# Patient Record
Sex: Female | Born: 1940
Health system: Southern US, Community
[De-identification: ages and names within clinical notes are randomized; demographics above are authoritative.]

## PROBLEM LIST (undated history)

## (undated) DIAGNOSIS — M1990 Unspecified osteoarthritis, unspecified site: Secondary | ICD-10-CM

## (undated) DIAGNOSIS — Z923 Personal history of irradiation: Secondary | ICD-10-CM

## (undated) DIAGNOSIS — M8580 Other specified disorders of bone density and structure, unspecified site: Secondary | ICD-10-CM

## (undated) DIAGNOSIS — D649 Anemia, unspecified: Secondary | ICD-10-CM

## (undated) DIAGNOSIS — T7840XA Allergy, unspecified, initial encounter: Secondary | ICD-10-CM

## (undated) DIAGNOSIS — I8390 Asymptomatic varicose veins of unspecified lower extremity: Secondary | ICD-10-CM

## (undated) DIAGNOSIS — C50919 Malignant neoplasm of unspecified site of unspecified female breast: Secondary | ICD-10-CM

## (undated) DIAGNOSIS — I10 Essential (primary) hypertension: Secondary | ICD-10-CM

## (undated) DIAGNOSIS — D126 Benign neoplasm of colon, unspecified: Secondary | ICD-10-CM

## (undated) HISTORY — PX: BICEPT TENODESIS: SHX5116

## (undated) HISTORY — DX: Asymptomatic varicose veins of unspecified lower extremity: I83.90

## (undated) HISTORY — DX: Other specified disorders of bone density and structure, unspecified site: M85.80

## (undated) HISTORY — DX: Allergy, unspecified, initial encounter: T78.40XA

## (undated) HISTORY — PX: COLONOSCOPY: SHX174

## (undated) HISTORY — DX: Benign neoplasm of colon, unspecified: D12.6

## (undated) HISTORY — DX: Malignant neoplasm of unspecified site of unspecified female breast: C50.919

## (undated) HISTORY — DX: Anemia, unspecified: D64.9

## (undated) HISTORY — PX: POLYPECTOMY: SHX149

## (undated) HISTORY — DX: Unspecified osteoarthritis, unspecified site: M19.90

## (undated) HISTORY — DX: Essential (primary) hypertension: I10

## (undated) HISTORY — PX: CATARACT EXTRACTION, BILATERAL: SHX1313

---
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
## 1983-04-01 HISTORY — PX: ABDOMINAL HYSTERECTOMY: SHX81

## 1998-01-18 ENCOUNTER — Ambulatory Visit (HOSPITAL_COMMUNITY): Admission: RE | Admit: 1998-01-18 | Discharge: 1998-01-18 | Payer: Self-pay | Admitting: *Deleted

## 1999-01-31 ENCOUNTER — Ambulatory Visit (HOSPITAL_COMMUNITY): Admission: RE | Admit: 1999-01-31 | Discharge: 1999-01-31 | Payer: Self-pay | Admitting: Internal Medicine

## 1999-12-17 ENCOUNTER — Other Ambulatory Visit: Admission: RE | Admit: 1999-12-17 | Discharge: 1999-12-17 | Payer: Self-pay | Admitting: Obstetrics and Gynecology

## 2000-02-13 ENCOUNTER — Encounter: Payer: Self-pay | Admitting: Obstetrics and Gynecology

## 2000-02-13 ENCOUNTER — Ambulatory Visit (HOSPITAL_COMMUNITY): Admission: RE | Admit: 2000-02-13 | Discharge: 2000-02-13 | Payer: Self-pay | Admitting: Obstetrics and Gynecology

## 2000-03-11 ENCOUNTER — Encounter: Payer: Self-pay | Admitting: Obstetrics and Gynecology

## 2000-03-11 ENCOUNTER — Encounter (INDEPENDENT_AMBULATORY_CARE_PROVIDER_SITE_OTHER): Payer: Self-pay | Admitting: Specialist

## 2000-03-11 ENCOUNTER — Encounter: Admission: RE | Admit: 2000-03-11 | Discharge: 2000-03-11 | Payer: Self-pay | Admitting: Obstetrics and Gynecology

## 2000-03-12 ENCOUNTER — Other Ambulatory Visit: Admission: RE | Admit: 2000-03-12 | Discharge: 2000-03-12 | Payer: Self-pay | Admitting: Obstetrics and Gynecology

## 2000-09-15 ENCOUNTER — Encounter: Admission: RE | Admit: 2000-09-15 | Discharge: 2000-09-15 | Payer: Self-pay | Admitting: Obstetrics and Gynecology

## 2000-09-15 ENCOUNTER — Encounter: Payer: Self-pay | Admitting: Obstetrics and Gynecology

## 2001-01-18 ENCOUNTER — Ambulatory Visit (HOSPITAL_COMMUNITY): Admission: RE | Admit: 2001-01-18 | Discharge: 2001-01-18 | Payer: Self-pay | Admitting: Obstetrics and Gynecology

## 2001-03-31 HISTORY — PX: SHOULDER SURGERY: SHX246

## 2001-06-14 ENCOUNTER — Ambulatory Visit (HOSPITAL_BASED_OUTPATIENT_CLINIC_OR_DEPARTMENT_OTHER): Admission: RE | Admit: 2001-06-14 | Discharge: 2001-06-14 | Payer: Self-pay | Admitting: Orthopedic Surgery

## 2001-12-29 ENCOUNTER — Other Ambulatory Visit: Admission: RE | Admit: 2001-12-29 | Discharge: 2001-12-29 | Payer: Self-pay | Admitting: Obstetrics and Gynecology

## 2002-01-19 ENCOUNTER — Ambulatory Visit (HOSPITAL_COMMUNITY): Admission: RE | Admit: 2002-01-19 | Discharge: 2002-01-19 | Payer: Self-pay | Admitting: Obstetrics and Gynecology

## 2003-01-25 ENCOUNTER — Ambulatory Visit (HOSPITAL_COMMUNITY): Admission: RE | Admit: 2003-01-25 | Discharge: 2003-01-25 | Payer: Self-pay | Admitting: *Deleted

## 2004-01-26 ENCOUNTER — Ambulatory Visit (HOSPITAL_COMMUNITY): Admission: RE | Admit: 2004-01-26 | Discharge: 2004-01-26 | Payer: Self-pay | Admitting: Obstetrics and Gynecology

## 2004-02-06 ENCOUNTER — Ambulatory Visit: Payer: Self-pay | Admitting: Internal Medicine

## 2004-02-13 ENCOUNTER — Ambulatory Visit: Payer: Self-pay | Admitting: Internal Medicine

## 2004-02-17 ENCOUNTER — Ambulatory Visit: Payer: Self-pay | Admitting: Internal Medicine

## 2004-02-26 ENCOUNTER — Encounter: Admission: RE | Admit: 2004-02-26 | Discharge: 2004-02-26 | Payer: Self-pay | Admitting: Internal Medicine

## 2005-01-27 ENCOUNTER — Ambulatory Visit (HOSPITAL_COMMUNITY): Admission: RE | Admit: 2005-01-27 | Discharge: 2005-01-27 | Payer: Self-pay | Admitting: Internal Medicine

## 2005-03-17 ENCOUNTER — Encounter: Admission: RE | Admit: 2005-03-17 | Discharge: 2005-03-17 | Payer: Self-pay | Admitting: Obstetrics and Gynecology

## 2005-03-20 ENCOUNTER — Ambulatory Visit: Payer: Self-pay | Admitting: Pulmonary Disease

## 2005-06-13 ENCOUNTER — Ambulatory Visit: Payer: Self-pay | Admitting: Internal Medicine

## 2005-06-16 ENCOUNTER — Ambulatory Visit: Payer: Self-pay | Admitting: Internal Medicine

## 2005-07-24 ENCOUNTER — Ambulatory Visit: Payer: Self-pay | Admitting: Internal Medicine

## 2005-07-30 ENCOUNTER — Ambulatory Visit (HOSPITAL_COMMUNITY): Admission: RE | Admit: 2005-07-30 | Discharge: 2005-07-30 | Payer: Self-pay | Admitting: Internal Medicine

## 2005-08-21 ENCOUNTER — Ambulatory Visit: Payer: Self-pay | Admitting: Endocrinology

## 2005-09-02 ENCOUNTER — Encounter (INDEPENDENT_AMBULATORY_CARE_PROVIDER_SITE_OTHER): Payer: Self-pay | Admitting: Specialist

## 2005-09-02 ENCOUNTER — Encounter: Admission: RE | Admit: 2005-09-02 | Discharge: 2005-09-02 | Payer: Self-pay | Admitting: Endocrinology

## 2005-09-02 ENCOUNTER — Other Ambulatory Visit: Admission: RE | Admit: 2005-09-02 | Discharge: 2005-09-02 | Payer: Self-pay | Admitting: Interventional Radiology

## 2005-09-15 ENCOUNTER — Encounter: Admission: RE | Admit: 2005-09-15 | Discharge: 2005-09-15 | Payer: Self-pay | Admitting: Obstetrics & Gynecology

## 2005-09-15 ENCOUNTER — Ambulatory Visit: Payer: Self-pay | Admitting: Endocrinology

## 2005-09-24 ENCOUNTER — Encounter (INDEPENDENT_AMBULATORY_CARE_PROVIDER_SITE_OTHER): Payer: Self-pay | Admitting: Specialist

## 2005-09-24 ENCOUNTER — Encounter: Admission: RE | Admit: 2005-09-24 | Discharge: 2005-09-24 | Payer: Self-pay | Admitting: Endocrinology

## 2006-01-28 ENCOUNTER — Ambulatory Visit (HOSPITAL_COMMUNITY): Admission: RE | Admit: 2006-01-28 | Discharge: 2006-01-28 | Payer: Self-pay | Admitting: Obstetrics & Gynecology

## 2006-02-10 ENCOUNTER — Encounter: Admission: RE | Admit: 2006-02-10 | Discharge: 2006-02-10 | Payer: Self-pay | Admitting: Obstetrics & Gynecology

## 2006-09-17 ENCOUNTER — Ambulatory Visit: Payer: Self-pay | Admitting: Internal Medicine

## 2006-09-17 LAB — CONVERTED CEMR LAB
AST: 21 units/L (ref 0–37)
Alkaline Phosphatase: 54 units/L (ref 39–117)
Basophils Absolute: 0 10*3/uL (ref 0.0–0.1)
Bilirubin Urine: NEGATIVE
Bilirubin, Direct: 0.1 mg/dL (ref 0.0–0.3)
Calcium: 9.4 mg/dL (ref 8.4–10.5)
Chloride: 107 meq/L (ref 96–112)
Cholesterol: 209 mg/dL (ref 0–200)
Creatinine, Ser: 0.6 mg/dL (ref 0.4–1.2)
Eosinophils Absolute: 0.1 10*3/uL (ref 0.0–0.6)
GFR calc non Af Amer: 106 mL/min
Glucose, Bld: 106 mg/dL — ABNORMAL HIGH (ref 70–99)
Ketones, ur: NEGATIVE mg/dL
Lymphocytes Relative: 25.7 % (ref 12.0–46.0)
MCHC: 35.3 g/dL (ref 30.0–36.0)
Monocytes Absolute: 0.4 10*3/uL (ref 0.2–0.7)
Nitrite: NEGATIVE
Potassium: 4.7 meq/L (ref 3.5–5.1)
Sodium: 142 meq/L (ref 135–145)
Specific Gravity, Urine: 1.015 (ref 1.000–1.03)
TSH: 0.5 microintl units/mL (ref 0.35–5.50)
Total Bilirubin: 0.7 mg/dL (ref 0.3–1.2)
Total CHOL/HDL Ratio: 2.9
VLDL: 12 mg/dL (ref 0–40)
pH: 7.5 (ref 5.0–8.0)

## 2006-09-23 ENCOUNTER — Ambulatory Visit: Payer: Self-pay | Admitting: Internal Medicine

## 2006-12-02 ENCOUNTER — Encounter: Payer: Self-pay | Admitting: Endocrinology

## 2006-12-24 ENCOUNTER — Other Ambulatory Visit: Admission: RE | Admit: 2006-12-24 | Discharge: 2006-12-24 | Payer: Self-pay | Admitting: Obstetrics & Gynecology

## 2007-01-20 ENCOUNTER — Encounter: Payer: Self-pay | Admitting: Endocrinology

## 2007-01-20 ENCOUNTER — Ambulatory Visit: Payer: Self-pay | Admitting: Endocrinology

## 2007-01-20 DIAGNOSIS — E042 Nontoxic multinodular goiter: Secondary | ICD-10-CM | POA: Insufficient documentation

## 2007-01-26 ENCOUNTER — Encounter: Admission: RE | Admit: 2007-01-26 | Discharge: 2007-01-26 | Payer: Self-pay | Admitting: Endocrinology

## 2007-02-03 ENCOUNTER — Telehealth (INDEPENDENT_AMBULATORY_CARE_PROVIDER_SITE_OTHER): Payer: Self-pay | Admitting: *Deleted

## 2007-02-15 ENCOUNTER — Ambulatory Visit (HOSPITAL_COMMUNITY): Admission: RE | Admit: 2007-02-15 | Discharge: 2007-02-15 | Payer: Self-pay | Admitting: Internal Medicine

## 2007-04-01 HISTORY — PX: TRICEPS TENDON REPAIR: SHX2577

## 2007-04-01 HISTORY — PX: ROTATOR CUFF REPAIR: SHX139

## 2007-04-09 ENCOUNTER — Encounter: Payer: Self-pay | Admitting: Internal Medicine

## 2007-11-03 ENCOUNTER — Ambulatory Visit (HOSPITAL_BASED_OUTPATIENT_CLINIC_OR_DEPARTMENT_OTHER): Admission: RE | Admit: 2007-11-03 | Discharge: 2007-11-03 | Payer: Self-pay | Admitting: Orthopedic Surgery

## 2008-02-18 ENCOUNTER — Ambulatory Visit (HOSPITAL_COMMUNITY): Admission: RE | Admit: 2008-02-18 | Discharge: 2008-02-18 | Payer: Self-pay | Admitting: Obstetrics & Gynecology

## 2008-04-17 ENCOUNTER — Encounter: Admission: RE | Admit: 2008-04-17 | Discharge: 2008-04-17 | Payer: Self-pay | Admitting: Obstetrics & Gynecology

## 2008-06-07 ENCOUNTER — Ambulatory Visit: Payer: Self-pay | Admitting: Internal Medicine

## 2008-06-07 DIAGNOSIS — M1990 Unspecified osteoarthritis, unspecified site: Secondary | ICD-10-CM

## 2008-06-08 ENCOUNTER — Telehealth: Payer: Self-pay | Admitting: Internal Medicine

## 2008-06-08 LAB — CONVERTED CEMR LAB
ALT: 55 units/L — ABNORMAL HIGH (ref 0–35)
AST: 36 units/L (ref 0–37)
Alkaline Phosphatase: 110 units/L (ref 39–117)
Cholesterol: 211 mg/dL (ref 0–200)
Creatinine, Ser: 0.5 mg/dL (ref 0.4–1.2)
Direct LDL: 104.3 mg/dL
Eosinophils Relative: 1.4 % (ref 0.0–5.0)
GFR calc Af Amer: 157 mL/min
GFR calc non Af Amer: 130 mL/min
Glucose, Bld: 106 mg/dL — ABNORMAL HIGH (ref 70–99)
HDL: 77 mg/dL (ref >39.0)
Hemoglobin, Urine: NEGATIVE
Ketones, ur: NEGATIVE mg/dL
Leukocytes, UA: NEGATIVE
MCV: 89.2 fL (ref 78.0–100.0)
Monocytes Relative: 6.6 % (ref 3.0–12.0)
Nitrite: NEGATIVE
Platelets: 267 10*3/uL (ref 150–400)
RBC: 4.02 M/uL (ref 3.87–5.11)
RDW: 12.5 % (ref 11.5–14.6)
Sodium: 140 meq/L (ref 135–145)
Total Protein, Urine: NEGATIVE mg/dL
Total Protein: 7.1 g/dL (ref 6.0–8.3)
Triglycerides: 58 mg/dL (ref 0–149)
Urine Glucose: NEGATIVE mg/dL
VLDL: 12 mg/dL (ref 0–40)
WBC: 5.5 10*3/uL (ref 4.5–10.5)
pH: 7.5 (ref 5.0–8.0)

## 2008-12-13 ENCOUNTER — Telehealth: Payer: Self-pay | Admitting: Internal Medicine

## 2009-02-19 ENCOUNTER — Ambulatory Visit (HOSPITAL_COMMUNITY): Admission: RE | Admit: 2009-02-19 | Discharge: 2009-02-19 | Payer: Self-pay | Admitting: Obstetrics & Gynecology

## 2009-03-31 DIAGNOSIS — C50919 Malignant neoplasm of unspecified site of unspecified female breast: Secondary | ICD-10-CM

## 2009-03-31 HISTORY — DX: Malignant neoplasm of unspecified site of unspecified female breast: C50.919

## 2009-03-31 HISTORY — PX: BREAST LUMPECTOMY: SHX2

## 2009-05-08 ENCOUNTER — Ambulatory Visit: Payer: Self-pay | Admitting: Internal Medicine

## 2009-05-08 DIAGNOSIS — R209 Unspecified disturbances of skin sensation: Secondary | ICD-10-CM | POA: Insufficient documentation

## 2009-05-10 LAB — CONVERTED CEMR LAB
ALT: 16 units/L (ref 0–35)
AST: 19 units/L (ref 0–37)
CO2: 33 meq/L — ABNORMAL HIGH (ref 19–32)
Creatinine, Ser: 0.6 mg/dL (ref 0.4–1.2)
GFR calc non Af Amer: 104.92 mL/min (ref >60)
Glucose, Bld: 99 mg/dL (ref 70–99)
Sed Rate: 33 mm/hr — ABNORMAL HIGH (ref 0–22)
Sodium: 141 meq/L (ref 135–145)
Vitamin B-12: 473 pg/mL (ref 211–911)

## 2009-05-28 ENCOUNTER — Telehealth: Payer: Self-pay | Admitting: Internal Medicine

## 2009-08-13 ENCOUNTER — Encounter: Payer: Self-pay | Admitting: Internal Medicine

## 2009-08-22 ENCOUNTER — Encounter: Payer: Self-pay | Admitting: Internal Medicine

## 2009-10-04 ENCOUNTER — Encounter: Admission: RE | Admit: 2009-10-04 | Discharge: 2009-10-04 | Payer: Self-pay | Admitting: Obstetrics & Gynecology

## 2010-02-25 ENCOUNTER — Encounter: Admission: RE | Admit: 2010-02-25 | Discharge: 2010-02-25 | Payer: Self-pay | Admitting: Obstetrics & Gynecology

## 2010-03-12 ENCOUNTER — Ambulatory Visit (HOSPITAL_COMMUNITY)
Admission: RE | Admit: 2010-03-12 | Discharge: 2010-03-12 | Payer: Self-pay | Source: Home / Self Care | Attending: Surgery | Admitting: Surgery

## 2010-03-19 ENCOUNTER — Encounter: Payer: Self-pay | Admitting: Internal Medicine

## 2010-04-03 LAB — BASIC METABOLIC PANEL
BUN: 12 mg/dL (ref 6–23)
CO2: 28 mEq/L (ref 19–32)
Calcium: 9.9 mg/dL (ref 8.4–10.5)
Chloride: 102 mEq/L (ref 96–112)
Creatinine, Ser: 0.69 mg/dL (ref 0.4–1.2)
GFR calc Af Amer: >60 mL/min (ref >60)
GFR calc non Af Amer: >60 mL/min (ref >60)
Glucose, Bld: 113 mg/dL — ABNORMAL HIGH (ref 70–99)
Potassium: 4.3 mEq/L (ref 3.5–5.1)
Sodium: 138 mEq/L (ref 135–145)

## 2010-04-03 LAB — CBC
HCT: 37.1 % (ref 36.0–46.0)
Hemoglobin: 12.6 g/dL (ref 12.0–15.0)
MCH: 29.7 pg (ref 26.0–34.0)
MCHC: 34 g/dL (ref 30.0–36.0)
MCV: 87.5 fL (ref 78.0–100.0)
Platelets: 245 10*3/uL (ref 150–400)
RBC: 4.24 MIL/uL (ref 3.87–5.11)
RDW: 12.6 % (ref 11.5–15.5)
WBC: 5.3 10*3/uL (ref 4.0–10.5)

## 2010-04-04 ENCOUNTER — Ambulatory Visit (HOSPITAL_COMMUNITY)
Admission: RE | Admit: 2010-04-04 | Discharge: 2010-04-04 | Payer: Self-pay | Source: Home / Self Care | Attending: Surgery | Admitting: Surgery

## 2010-04-04 LAB — DIFFERENTIAL
Basophils Absolute: 0 10*3/uL (ref 0.0–0.1)
Basophils Relative: 0 % (ref 0–1)
Eosinophils Absolute: 0.1 10*3/uL (ref 0.0–0.7)
Eosinophils Relative: 2 % (ref 0–5)
Lymphocytes Relative: 24 % (ref 12–46)
Lymphs Abs: 1.3 10*3/uL (ref 0.7–4.0)
Monocytes Absolute: 0.4 10*3/uL (ref 0.1–1.0)
Monocytes Relative: 7 % (ref 3–12)
Neutro Abs: 3.6 10*3/uL (ref 1.7–7.7)
Neutrophils Relative %: 67 % (ref 43–77)

## 2010-04-09 ENCOUNTER — Encounter: Payer: Self-pay | Admitting: Internal Medicine

## 2010-04-09 ENCOUNTER — Ambulatory Visit: Payer: Self-pay | Admitting: Oncology

## 2010-04-22 ENCOUNTER — Encounter: Payer: Self-pay | Admitting: Internal Medicine

## 2010-04-22 LAB — COMPREHENSIVE METABOLIC PANEL
ALT: 65 U/L — ABNORMAL HIGH (ref 0–35)
AST: 43 U/L — ABNORMAL HIGH (ref 0–37)
Albumin: 3.7 g/dL (ref 3.5–5.2)
Alkaline Phosphatase: 111 U/L (ref 39–117)
BUN: 16 mg/dL (ref 6–23)
CO2: 29 mEq/L (ref 19–32)
Calcium: 9.5 mg/dL (ref 8.4–10.5)
Chloride: 100 mEq/L (ref 96–112)
Creatinine, Ser: 0.69 mg/dL (ref 0.40–1.20)
Glucose, Bld: 96 mg/dL (ref 70–99)
Potassium: 4.2 mEq/L (ref 3.5–5.3)
Total Bilirubin: 0.6 mg/dL (ref 0.3–1.2)
Total Protein: 7.5 g/dL (ref 6.0–8.3)

## 2010-04-23 ENCOUNTER — Ambulatory Visit
Admission: RE | Admit: 2010-04-23 | Discharge: 2010-04-30 | Payer: Self-pay | Source: Home / Self Care | Attending: Radiation Oncology | Admitting: Radiation Oncology

## 2010-04-24 ENCOUNTER — Encounter: Payer: Self-pay | Admitting: Internal Medicine

## 2010-05-02 NOTE — Progress Notes (Signed)
Summary: Not better  Phone Note Call from Patient   Summary of Call: Pt has elevated her foot and been wearing loose shoes but continues to c/o same symptoms.  Initial call taken by: Lamar Sprinkles, CMA,  May 28, 2009 10:58 AM  Follow-up for Phone Call        I would suggest a Neurol consult if OK w/pt Follow-up by: Tresa Garter MD,  May 28, 2009 5:14 PM  Additional Follow-up for Phone Call Additional follow up Details #1::        left mess to call office back. Additional Follow-up by: Lamar Sprinkles, CMA,  May 29, 2009 11:48 AM    Additional Follow-up for Phone Call Additional follow up Details #2::    Patient notified and she will observe the issue a little longer, but please go ahead and make referral, pt will go if MD feels it necessary. Patient aware to expect a call from [facility]. Follow-up by: Lucious Groves,  May 30, 2009 9:15 AM  Additional Follow-up for Phone Call Additional follow up Details #3:: Details for Additional Follow-up Action Taken: OK Additional Follow-up by: Tresa Garter MD,  May 30, 2009 1:14 PM

## 2010-05-02 NOTE — Consult Note (Signed)
Summary: [facility]  [facility]   Imported By: Sherian Rein 09/04/2009 14:52:48  _____________________________________________________________________  External Attachment:    Type:   Image     Comment:   External Document

## 2010-05-02 NOTE — Assessment & Plan Note (Signed)
Summary: numbness in left foot and leg-lb   Vital Signs:  Patient profile:   70 year old female Height:      65 inches Weight:      147 pounds BMI:     24.55 Temp:     98.5 degrees F oral Pulse rate:   66 / minute BP sitting:   152 / 66  (left arm)  Vitals Entered By: Tora Perches (May 08, 2009 4:21 PM) CC: numbness in left leg and foot  Is Patient Diabetic? No   Primary Care Provider:  Dr. Posey Rea  CC:  numbness in left leg and foot .  History of Present Illness: C/o in L foot x 1 wk, constant  L lat area and heel C/o plantar fasciitis R x 1 year  Preventive Screening-Counseling & Management  Alcohol-Tobacco     Smoking Status: current  Current Medications (verified): 1)  Calcium 600 Mg  Tabs (Calcium) .... Take 1 By Mouth Qd 2)  Multivitamins   Tabs (Multiple Vitamin) .... 2-3 X Weekly 3)  Glucosamine  2000mg  .... Take 2 By Mouth Qd 4)  Fish Oil  Oil (Fish Oil) .... Once Daily 5)  Voltaren 1 % Gel (Diclofenac Sodium) .... As Needed 6)  Vivelle-Dot 0.025 Mg/24hr Pttw (Estradiol) .... Twice Weekly 7)  Vitamin D3 1000 Unit  Tabs (Cholecalciferol) .... 2 By Mouth Daily  Allergies: 1)  ! Codeine Sulfate (Codeine Sulfate) 2)  Aspirin  Past History:  Past Medical History: Last updated: 06/07/2008 GOITER, NONTOXIC MULTINODULAR (ICD-241.1) GYN Dr Hyacinth Meeker Osteoarthritis  Social History: Last updated: 06/07/2008 Retired Married Never Smoked Regular exercise-yes  Social History: Smoking Status:  current  Physical Exam  General:  Well-developed,well-nourished,in no acute distress; alert,appropriate and cooperative throughout examination Msk:  B feet WNL, NT Tight shoes Skin:  Feet WNL Psych:  Oriented X3.     Impression & Recommendations:  Problem # 1:  PARESTHESIA (ICD-782.0) L foot pressure neuropathy (lateral) Assessment New Loosen up shoe laces Orders: TLB-B12, Serum-Total ONLY (82956-O13) TLB-BMP (Basic Metabolic Panel-BMET)  (80048-METABOL) TLB-Sedimentation Rate (ESR) (85652-ESR) TLB-TSH (Thyroid Stimulating Hormone) (84443-TSH) TLB-Hepatic/Liver Function Pnl (80076-HEPATIC)  Complete Medication List: 1)  Calcium 600 Mg Tabs (Calcium) .... Take 1 by mouth qd 2)  Multivitamins Tabs (Multiple vitamin) .... 2-3 x weekly 3)  Glucosamine 2000mg   .... Take 2 by mouth qd 4)  Fish Oil Oil (Fish oil) .... Once daily 5)  Voltaren 1 % Gel (Diclofenac sodium) .... As needed 6)  Vivelle-dot 0.025 Mg/24hr Pttw (Estradiol) .... Twice weekly 7)  Vitamin D3 1000 Unit Tabs (Cholecalciferol) .... 2 by mouth daily  Patient Instructions: 1)  Call if you are not better in a reasonable amount of time or if worse.

## 2010-05-02 NOTE — Letter (Signed)
Summary: [facility]   Imported By: Sherian Rein 04/11/2010 11:14:17  _____________________________________________________________________  External Attachment:    Type:   Image     Comment:   External Document

## 2010-05-06 ENCOUNTER — Ambulatory Visit: Payer: Medicare Other | Attending: Radiation Oncology | Admitting: Radiation Oncology

## 2010-05-06 DIAGNOSIS — C50919 Malignant neoplasm of unspecified site of unspecified female breast: Secondary | ICD-10-CM | POA: Insufficient documentation

## 2010-05-06 DIAGNOSIS — Z510 Encounter for antineoplastic radiation therapy: Secondary | ICD-10-CM | POA: Insufficient documentation

## 2010-05-06 DIAGNOSIS — R21 Rash and other nonspecific skin eruption: Secondary | ICD-10-CM | POA: Insufficient documentation

## 2010-05-06 DIAGNOSIS — Y842 Radiological procedure and radiotherapy as the cause of abnormal reaction of the patient, or of later complication, without mention of misadventure at the time of the procedure: Secondary | ICD-10-CM | POA: Insufficient documentation

## 2010-05-08 NOTE — Letter (Signed)
Summary: [facility]   Imported By: Sherian Rein 05/01/2010 09:58:34  _____________________________________________________________________  External Attachment:    Type:   Image     Comment:   External Document

## 2010-05-08 NOTE — Letter (Signed)
Summary: [facility]  [facility]   Imported By: Lennie Odor 04/30/2010 14:43:08  _____________________________________________________________________  External Attachment:    Type:   Image     Comment:   External Document

## 2010-05-22 NOTE — Letter (Signed)
Summary: [facility]  [facility]   Imported By: Lester [place] 05/14/2010 10:55:01  _____________________________________________________________________  External Attachment:    Type:   Image     Comment:   External Document

## 2010-06-03 ENCOUNTER — Encounter (HOSPITAL_BASED_OUTPATIENT_CLINIC_OR_DEPARTMENT_OTHER): Payer: Medicare Other | Admitting: Oncology

## 2010-06-03 ENCOUNTER — Other Ambulatory Visit: Payer: Self-pay | Admitting: Oncology

## 2010-06-03 DIAGNOSIS — D0590 Unspecified type of carcinoma in situ of unspecified breast: Secondary | ICD-10-CM

## 2010-06-03 LAB — CBC WITH DIFFERENTIAL/PLATELET
Basophils Absolute: 0 10*3/uL (ref 0.0–0.1)
Eosinophils Absolute: 0.1 10*3/uL (ref 0.0–0.5)
MCH: 29.8 pg (ref 25.1–34.0)
MONO#: 0.3 10*3/uL (ref 0.1–0.9)
MONO%: 6.4 % (ref 0.0–14.0)
Platelets: 260 10*3/uL (ref 145–400)

## 2010-06-03 LAB — COMPREHENSIVE METABOLIC PANEL
ALT: 19 U/L (ref 0–35)
AST: 21 U/L (ref 0–37)
Albumin: 4 g/dL (ref 3.5–5.2)
CO2: 30 mEq/L (ref 19–32)
Calcium: 9.7 mg/dL (ref 8.4–10.5)
Chloride: 101 mEq/L (ref 96–112)
Glucose, Bld: 93 mg/dL (ref 70–99)
Potassium: 3.8 mEq/L (ref 3.5–5.3)
Total Bilirubin: 0.6 mg/dL (ref 0.3–1.2)

## 2010-06-10 LAB — CBC
HCT: 35.8 % — ABNORMAL LOW (ref 36.0–46.0)
Hemoglobin: 12.1 g/dL (ref 12.0–15.0)
MCHC: 33.8 g/dL (ref 30.0–36.0)
MCV: 86.9 fL (ref 78.0–100.0)
WBC: 6.2 10*3/uL (ref 4.0–10.5)

## 2010-06-10 LAB — SURGICAL PCR SCREEN
MRSA, PCR: NEGATIVE
Staphylococcus aureus: NEGATIVE

## 2010-07-10 ENCOUNTER — Encounter (HOSPITAL_BASED_OUTPATIENT_CLINIC_OR_DEPARTMENT_OTHER): Payer: Medicare Other | Admitting: Oncology

## 2010-07-10 ENCOUNTER — Ambulatory Visit: Payer: Medicare Other | Attending: Radiation Oncology | Admitting: Radiation Oncology

## 2010-07-10 DIAGNOSIS — C50119 Malignant neoplasm of central portion of unspecified female breast: Secondary | ICD-10-CM

## 2010-07-10 DIAGNOSIS — D0590 Unspecified type of carcinoma in situ of unspecified breast: Secondary | ICD-10-CM

## 2010-08-13 NOTE — Assessment & Plan Note (Signed)
[facility]                           PRIMARY CARE OFFICE NOTE   NAME:Caroline Olson, Caroline Olson                       MRN:          [id number]  DATE:09/23/2006                            DOB:          1940/04/06    The patient is a 70 year old female who presents for a wellness  examination. Past medical history, family history and social history as  per July 24, 2005.   Note, she had several episodes of tinnitus. Her toenail fungus in the  right foot is not much better. She was diagnosed with thyroid nodule.   CURRENT MEDICATIONS:  Glucosamine, calcium with vitamin D, multivitamin,  Femring.   REVIEW OF SYSTEMS:  Occasional ringing in the ears usually provoked by  Advil or other antiinflammatories. No chest pain or shortness of breath.  No syncope. No problems with arthritis. The rest of the 18-point review  of systems is negative.   PHYSICAL EXAMINATION:  VITAL SIGNS:  Blood pressure 129/65, pulse 62,  temperature 98.6, weight 141 pounds.  GENERAL:  She looks well in no acute distress.  HEENT:  Moist mucosa.  NECK:  Supple, no thyromegaly. I am not able to feel a nodule today. No  bruits.  LUNGS:  Clear. No wheezes or rales.  HEART:  S1, S2. Grade 1-2/6 systolic murmur.  ABDOMEN:  Soft, nontender, no organomegaly felt.  EXTREMITIES:  Lower extremities without edema.  NEUROLOGIC:  She is alert and cooperative. Denies being depressed.  SKIN:  Clear. A couple spots of hyperpigmentation on the shins. Right  great toenail is heavily infected with a fungus.   LABORATORY DATA:  On September 17, 2006 CBC normal, blood glucose 106, LFTs  normal, cholesterol 209, LDL 105, HDL 72.2. TSH normal. Urinalysis  normal.   EKG normal with incomplete right bundle branch block.   ASSESSMENT/PLAN:  1. Normal wellness examination. Age/health-related issues discussed.      Healthy lifestyle discussed. Her regular GYN care/mammogram and Pap      with her gynecologist  yearly. If constipated, she will just take      vitamin D3 1000 units a day without calcium. I will bring her back      in 12 months. Given tetanus and pneumonia shots today. Information      about Zostavax provided.  2. Rosacea. Continue with topical creams.  3. Tinnitus provoked by antiinflammatory medications. She will use      loratadine 10 mg daily and Afrin for 3-4 days p.r.n. allergies.  4. Onychomycosis with extensive involvement of the right great      toenail. Podiatry consult with      Dr. Ralene Cork for possible removal. She failed treatment with Penlac.  5. Thyroid nodule. Followup with Dr. Everardo All for recheck.     Georgina Quint. Plotnikov, MD  Electronically Signed    AVP/MedQ  DD: 09/23/2006  DT: 09/23/2006  Job #: [id number]   cc:   Alvan Dame, D.P.M.  Sean A. Everardo All, MD

## 2010-08-16 NOTE — Op Note (Signed)
NAME:  Caroline Olson, Caroline Olson                ACCOUNT NO.:  [id number]   MEDICAL RECORD NO.:  [id number]          PATIENT TYPE:  AMB   LOCATION:  [facility]                          FACILITY:  [facility]   PHYSICIAN:  Artist Pais. Weingold, M.D.DATE OF BIRTH:  [birth year]   DATE OF PROCEDURE:  DATE OF DISCHARGE:  11/03/2007                               OPERATIVE REPORT   PREOPERATIVE DIAGNOSES:  Ruptured right elbow biceps tendon.   POSTOPERATIVE DIAGNOSIS:  Ruptured right elbow biceps tendon.   PROCEDURE:  Repair of above using Arthrex Endobutton technique.   SURGEON:  Artist Pais. Mina Marble, MD   ANESTHESIA:  Axillary block with IV sedation.   TOURNIQUET TIME:  115 minutes.   COMPLICATIONS:  None.   OPERATION REPORT:  The patient was taken to the operating suite.  After  induction of adequate anesthesia, right upper extremity was prepped and  draped in the sterile fashion.  An Esmarch was used to exsanguinate the  limb and tourniquet was inflated to 250 mmHg.  At this point in time, an  incision was made in the antecubital fossa, extended proximally and  distally.  Dissection was carried down to level of the lacertus fibrosus  and the biceps tendon.  Biceps tendon was placed down to the bicipital  tuberosity which was identified.  Fluoroscopically once this was done,  the partially intact biceps tendon repaired using Arthrex Endobutton  technique using standard protocol.  After this was done, the wound was  irrigated.  The small amount of biceps tendon that remained attached was  oversewed using the same suture that was used for primary repair.  After  this was done, hemostasis was achieved with electrocautery.  The wound  was loosely closed with 2-0 undyed Vicryl subcutaneously and a 4-0  Monocryl stitch in the skin.  Steri-Strips, 4 x 4s, fluffs, and a splint  with the  elbow flexed to 90 degrees wrist in neutral, forearm in  neutral was applied.  The patient tolerated the procedures well, went to  recovery in stable fashion.      Artist Pais Mina Marble, M.D.  Electronically Signed     MAW/MEDQ  D:  11/05/2007  T:  11/06/2007  Job:  [id number]

## 2010-09-10 ENCOUNTER — Encounter (INDEPENDENT_AMBULATORY_CARE_PROVIDER_SITE_OTHER): Payer: Self-pay | Admitting: Surgery

## 2010-09-30 ENCOUNTER — Other Ambulatory Visit: Payer: Self-pay | Admitting: Oncology

## 2010-09-30 ENCOUNTER — Encounter (HOSPITAL_BASED_OUTPATIENT_CLINIC_OR_DEPARTMENT_OTHER): Payer: Medicare Other | Admitting: Oncology

## 2010-09-30 DIAGNOSIS — C50119 Malignant neoplasm of central portion of unspecified female breast: Secondary | ICD-10-CM

## 2010-09-30 LAB — CBC WITH DIFFERENTIAL/PLATELET
Basophils Absolute: 0 10*3/uL (ref 0.0–0.1)
Eosinophils Absolute: 0.1 10*3/uL (ref 0.0–0.5)
HGB: 11.6 g/dL (ref 11.6–15.9)
LYMPH%: 22.5 % (ref 14.0–49.7)
MCH: 30.8 pg (ref 25.1–34.0)
MCV: 87.5 fL (ref 79.5–101.0)
MONO%: 6.3 % (ref 0.0–14.0)
NEUT%: 69 % (ref 38.4–76.8)
RBC: 3.76 10*6/uL (ref 3.70–5.45)
RDW: 13 % (ref 11.2–14.5)

## 2010-09-30 LAB — COMPREHENSIVE METABOLIC PANEL
CO2: 27 mEq/L (ref 19–32)
Calcium: 9.5 mg/dL (ref 8.4–10.5)
Chloride: 102 mEq/L (ref 96–112)
Creatinine, Ser: 0.7 mg/dL (ref 0.50–1.10)
Glucose, Bld: 103 mg/dL — ABNORMAL HIGH (ref 70–99)
Total Bilirubin: 0.3 mg/dL (ref 0.3–1.2)

## 2010-09-30 LAB — CANCER ANTIGEN 27.29: CA 27.29: 19 U/mL (ref 0–39)

## 2010-10-07 ENCOUNTER — Encounter (HOSPITAL_BASED_OUTPATIENT_CLINIC_OR_DEPARTMENT_OTHER): Payer: Medicare Other | Admitting: Oncology

## 2010-10-07 DIAGNOSIS — D0590 Unspecified type of carcinoma in situ of unspecified breast: Secondary | ICD-10-CM

## 2010-12-27 LAB — POCT HEMOGLOBIN-HEMACUE: Hemoglobin: 11.8 — ABNORMAL LOW

## 2011-01-06 ENCOUNTER — Ambulatory Visit
Admission: RE | Admit: 2011-01-06 | Discharge: 2011-01-06 | Disposition: A | Discharge status: Home / Self Care | Payer: Medicare Other | Source: Ambulatory Visit | Attending: Radiation Oncology | Admitting: Radiation Oncology

## 2011-01-22 ENCOUNTER — Other Ambulatory Visit: Payer: Self-pay | Admitting: Obstetrics & Gynecology

## 2011-01-22 DIAGNOSIS — Z9889 Other specified postprocedural states: Secondary | ICD-10-CM

## 2011-01-22 DIAGNOSIS — C50919 Malignant neoplasm of unspecified site of unspecified female breast: Secondary | ICD-10-CM

## 2011-02-27 ENCOUNTER — Ambulatory Visit
Admission: RE | Admit: 2011-02-27 | Discharge: 2011-02-27 | Disposition: A | Discharge status: Home / Self Care | Payer: Medicare Other | Source: Ambulatory Visit | Attending: Obstetrics & Gynecology | Admitting: Obstetrics & Gynecology

## 2011-02-27 DIAGNOSIS — C50919 Malignant neoplasm of unspecified site of unspecified female breast: Secondary | ICD-10-CM

## 2011-02-27 DIAGNOSIS — Z9889 Other specified postprocedural states: Secondary | ICD-10-CM

## 2011-03-06 ENCOUNTER — Telehealth: Payer: Self-pay | Admitting: Oncology

## 2011-03-06 NOTE — Telephone Encounter (Signed)
per pof 10/07/2010 called pt and scheduled appts for [id number]

## 2011-06-02 ENCOUNTER — Encounter: Payer: Self-pay | Admitting: Internal Medicine

## 2011-06-25 DIAGNOSIS — H2510 Age-related nuclear cataract, unspecified eye: Secondary | ICD-10-CM | POA: Diagnosis not present

## 2011-07-07 DIAGNOSIS — H2510 Age-related nuclear cataract, unspecified eye: Secondary | ICD-10-CM | POA: Diagnosis not present

## 2011-07-08 ENCOUNTER — Ambulatory Visit (INDEPENDENT_AMBULATORY_CARE_PROVIDER_SITE_OTHER): Payer: Medicare Other | Admitting: Internal Medicine

## 2011-07-08 ENCOUNTER — Encounter: Payer: Self-pay | Admitting: Internal Medicine

## 2011-07-08 VITALS — BP 130/70 | HR 76 | Temp 98.8°F | Resp 16 | Ht 65.0 in | Wt 122.0 lb

## 2011-07-08 DIAGNOSIS — C50919 Malignant neoplasm of unspecified site of unspecified female breast: Secondary | ICD-10-CM | POA: Diagnosis not present

## 2011-07-08 DIAGNOSIS — Z136 Encounter for screening for cardiovascular disorders: Secondary | ICD-10-CM | POA: Diagnosis not present

## 2011-07-08 DIAGNOSIS — R5383 Other fatigue: Secondary | ICD-10-CM

## 2011-07-08 DIAGNOSIS — Z0000 Encounter for general adult medical examination without abnormal findings: Secondary | ICD-10-CM

## 2011-07-08 DIAGNOSIS — M8580 Other specified disorders of bone density and structure, unspecified site: Secondary | ICD-10-CM

## 2011-07-08 NOTE — Assessment & Plan Note (Addendum)
The patient is here for annual Medicare wellness examination and management of other chronic and acute problems.   The risk factors are reflected in the social history.  The roster of all physicians providing medical care to patient - is listed in the Snapshot section of the chart.  Activities of daily living:  The patient is 100% inedpendent in all ADLs: dressing, toileting, feeding as well as independent mobility  Home safety : The patient has smoke detectors in the home. They wear seatbelts.No firearms at home ( firearms are present in the home, kept in a safe fashion). There is no violence in the home.   There is no risks for hepatitis, STDs or HIV. There is no   history of blood transfusion. They have no travel history to infectious disease endemic areas of the world.  The patient has seen their dentist in the last six month. They have  seen their eye doctor in the last year. They deny any hearing difficulty and have not had audiologic testing in the last year.  They do not  have excessive sun exposure. Discussed the need for sun protection: hats, long sleeves and use of sunscreen if there is significant sun exposure.   Diet: the importance of a healthy diet is discussed. They do have a healthy diet.  The patient has a regular exercise program: walking, ___7__per week.  The benefits of regular aerobic exercise were discussed.  Depression screen: there are no signs or vegative symptoms of depression- irritability, change in appetite, anhedonia, sadness/tearfullness.  Cognitive assessment: the patient manages all their financial and personal affairs and is actively engaged. They could relate day,date,year and events; recalled 3/3 objects at 3 minutes; performed clock-face test normally.  The following portions of the patient's history were reviewed and updated as appropriate: allergies, current medications, past family history, past medical history,  past surgical history, past social  history  and problem list.  Vision, hearing, body mass index were assessed and reviewed.   During the course of the visit the patient was educated and counseled about appropriate screening and preventive services including : fall prevention , diabetes screening, nutrition counseling, colorectal cancer screening, and recommended immunizations. Colon due with Dr Pernell Dupre

## 2011-07-08 NOTE — Progress Notes (Signed)
Subjective:    Patient ID: Caroline Olson, female    DOB: 05-19-1940, 71 y.o.   MRN: [id number]  HPI  The patient is here for a wellness exam. The patient has been doing well overall without major physical or psychological issues going on lately except for L br Ca in situ 2012   Wt Readings from Last 3 Encounters:  07/08/11 122 lb (55.339 kg)  05/08/09 147 lb (66.679 kg)  06/07/08 146 lb (66.225 kg)   BP Readings from Last 3 Encounters:  07/08/11 130/70  05/08/09 152/66  06/07/08 152/74      Review of Systems  Constitutional: Negative for fever, chills, diaphoresis, activity change, appetite change, fatigue and unexpected weight change.  HENT: Negative for hearing loss, ear pain, congestion, sore throat, sneezing, mouth sores, neck pain, dental problem, voice change, postnasal drip and sinus pressure.   Eyes: Negative for pain and visual disturbance.  Respiratory: Negative for cough, chest tightness, wheezing and stridor.   Cardiovascular: Negative for chest pain, palpitations and leg swelling.  Gastrointestinal: Negative for nausea, vomiting, abdominal pain, blood in stool, abdominal distention and rectal pain.  Genitourinary: Negative for dysuria, hematuria, decreased urine volume, vaginal bleeding, vaginal discharge, difficulty urinating, vaginal pain and menstrual problem.  Musculoskeletal: Negative for back pain, joint swelling and gait problem.  Skin: Negative for color change, rash and wound.  Neurological: Negative for dizziness, tremors, syncope, speech difficulty and light-headedness.  Hematological: Negative for adenopathy.  Psychiatric/Behavioral: Negative for suicidal ideas, hallucinations, behavioral problems, confusion, sleep disturbance, dysphoric mood and decreased concentration. The patient is not hyperactive.        Objective:   Physical Exam  Constitutional: She appears well-developed. No distress.  HENT:  Head: Normocephalic.  Right Ear: External ear  normal.  Left Ear: External ear normal.  Nose: Nose normal.  Mouth/Throat: Oropharynx is clear and moist.  Eyes: Conjunctivae are normal. Pupils are equal, round, and reactive to light. Right eye exhibits no discharge. Left eye exhibits no discharge.  Neck: Normal range of motion. Neck supple. No JVD present. No tracheal deviation present. No thyromegaly present.  Cardiovascular: Normal rate, regular rhythm and normal heart sounds.   Pulmonary/Chest: No stridor. No respiratory distress. She has no wheezes.  Abdominal: Soft. Bowel sounds are normal. She exhibits no distension and no mass. There is no tenderness. There is no rebound and no guarding.  Musculoskeletal: She exhibits no edema and no tenderness.  Lymphadenopathy:    She has no cervical adenopathy.  Neurological: She displays normal reflexes. No cranial nerve deficit. She exhibits normal muscle tone. Coordination normal.  Skin: No rash noted. No erythema.  Psychiatric: She has a normal mood and affect. Her behavior is normal. Judgment and thought content normal.   Lab Results  Component Value Date   WBC 4.8 09/30/2010   HGB 11.6 09/30/2010   HCT 32.9* 09/30/2010   PLT 225 09/30/2010   GLUCOSE 103* 09/30/2010   GLUCOSE 103* 09/30/2010   CHOL 211* 06/07/2008   TRIG 58 06/07/2008   HDL 77.0 06/07/2008   LDLDIRECT 104.3 06/07/2008   ALT 14 09/30/2010   ALT 14 09/30/2010   AST 22 09/30/2010   AST 22 09/30/2010   NA 140 09/30/2010   NA 140 09/30/2010   K 4.2 09/30/2010   K 4.2 09/30/2010   CL 102 09/30/2010   CL 102 09/30/2010   CREATININE 0.70 09/30/2010   CREATININE 0.70 09/30/2010   BUN 18 09/30/2010   BUN 18 09/30/2010   CO2  27 09/30/2010   CO2 27 09/30/2010   TSH 0.54 05/08/2009          Assessment & Plan:

## 2011-07-08 NOTE — Assessment & Plan Note (Signed)
L breast in situ 12/11 Dr Magrinat; s/p resection and XRT 

## 2011-07-10 ENCOUNTER — Other Ambulatory Visit (INDEPENDENT_AMBULATORY_CARE_PROVIDER_SITE_OTHER): Payer: Medicare Other

## 2011-07-10 DIAGNOSIS — Z136 Encounter for screening for cardiovascular disorders: Secondary | ICD-10-CM | POA: Diagnosis not present

## 2011-07-10 DIAGNOSIS — Z0000 Encounter for general adult medical examination without abnormal findings: Secondary | ICD-10-CM | POA: Diagnosis not present

## 2011-07-10 DIAGNOSIS — R5383 Other fatigue: Secondary | ICD-10-CM

## 2011-07-10 DIAGNOSIS — C50919 Malignant neoplasm of unspecified site of unspecified female breast: Secondary | ICD-10-CM | POA: Diagnosis not present

## 2011-07-10 DIAGNOSIS — M949 Disorder of cartilage, unspecified: Secondary | ICD-10-CM | POA: Diagnosis not present

## 2011-07-10 DIAGNOSIS — R5381 Other malaise: Secondary | ICD-10-CM | POA: Diagnosis not present

## 2011-07-10 DIAGNOSIS — M899 Disorder of bone, unspecified: Secondary | ICD-10-CM | POA: Diagnosis not present

## 2011-07-10 DIAGNOSIS — M8580 Other specified disorders of bone density and structure, unspecified site: Secondary | ICD-10-CM

## 2011-07-10 LAB — CBC WITH DIFFERENTIAL/PLATELET
Eosinophils Relative: 1.1 % (ref 0.0–5.0)
Lymphs Abs: 1 10*3/uL (ref 0.7–4.0)
MCHC: 33.7 g/dL (ref 30.0–36.0)
MCV: 91.2 fl (ref 78.0–100.0)
Monocytes Relative: 6.4 % (ref 3.0–12.0)
Platelets: 227 10*3/uL (ref 150.0–400.0)
RDW: 13.1 % (ref 11.5–14.6)

## 2011-07-10 LAB — LIPID PANEL
HDL: 80.4 mg/dL (ref >39.00)
LDL Cholesterol: 111 mg/dL — ABNORMAL HIGH (ref 0–99)
Total CHOL/HDL Ratio: 2
Triglycerides: 25 mg/dL (ref 0.0–149.0)
VLDL: 5 mg/dL (ref 0.0–40.0)

## 2011-07-10 LAB — URINALYSIS
Leukocytes, UA: NEGATIVE
Nitrite: NEGATIVE
Specific Gravity, Urine: 1.015 (ref 1.000–1.030)
Urobilinogen, UA: 1 (ref 0.0–1.0)

## 2011-07-10 LAB — BASIC METABOLIC PANEL
CO2: 29 mEq/L (ref 19–32)
Calcium: 9.6 mg/dL (ref 8.4–10.5)
Glucose, Bld: 92 mg/dL (ref 70–99)
Sodium: 139 mEq/L (ref 135–145)

## 2011-07-10 LAB — TSH: TSH: 0.62 u[IU]/mL (ref 0.35–5.50)

## 2011-07-10 LAB — HEPATIC FUNCTION PANEL
AST: 18 U/L (ref 0–37)
Albumin: 4.1 g/dL (ref 3.5–5.2)
Total Bilirubin: 0.7 mg/dL (ref 0.3–1.2)

## 2011-07-14 DIAGNOSIS — H2510 Age-related nuclear cataract, unspecified eye: Secondary | ICD-10-CM | POA: Diagnosis not present

## 2011-07-14 DIAGNOSIS — IMO0002 Reserved for concepts with insufficient information to code with codable children: Secondary | ICD-10-CM | POA: Diagnosis not present

## 2011-07-14 DIAGNOSIS — H269 Unspecified cataract: Secondary | ICD-10-CM | POA: Diagnosis not present

## 2011-07-16 ENCOUNTER — Encounter (INDEPENDENT_AMBULATORY_CARE_PROVIDER_SITE_OTHER): Payer: Self-pay | Admitting: Surgery

## 2011-07-21 DIAGNOSIS — H2510 Age-related nuclear cataract, unspecified eye: Secondary | ICD-10-CM | POA: Diagnosis not present

## 2011-07-28 DIAGNOSIS — H269 Unspecified cataract: Secondary | ICD-10-CM | POA: Diagnosis not present

## 2011-07-28 DIAGNOSIS — H2510 Age-related nuclear cataract, unspecified eye: Secondary | ICD-10-CM | POA: Diagnosis not present

## 2011-07-28 DIAGNOSIS — IMO0002 Reserved for concepts with insufficient information to code with codable children: Secondary | ICD-10-CM | POA: Diagnosis not present

## 2011-09-15 ENCOUNTER — Ambulatory Visit (INDEPENDENT_AMBULATORY_CARE_PROVIDER_SITE_OTHER): Payer: Medicare Other | Admitting: Surgery

## 2011-09-15 ENCOUNTER — Encounter (INDEPENDENT_AMBULATORY_CARE_PROVIDER_SITE_OTHER): Payer: Self-pay | Admitting: Surgery

## 2011-09-15 VITALS — BP 120/82 | HR 60 | Temp 96.8°F | Resp 18 | Ht 65.5 in | Wt 120.4 lb

## 2011-09-15 DIAGNOSIS — Z853 Personal history of malignant neoplasm of breast: Secondary | ICD-10-CM | POA: Diagnosis not present

## 2011-09-15 NOTE — Progress Notes (Signed)
Subjective:     Patient ID: Caroline Olson, female   DOB: November 24, 1940, 71 y.o.   MRN: [id number]  HPI  She is here for a one year followup. She had a history of ductal carcinoma in situ of the left breast status post lumpectomy and postop radiation. She refused anti-hormonal therapy. She is doing well and has had no complaints. Her last mammograms in November were normal. Review of Systems     Objective:   Physical Exam On exam, her breasts are normal in appearance bilaterally. There are no palpable masses in either breast. Her left breast incision is well healed. There is no axillary adenopathy on either side.    Assessment:     Long-term followup left breast ductal carcinoma in situ    Plan:     She is doing well. I will see her back in one year unless she palpates no mass

## 2012-01-07 ENCOUNTER — Encounter: Payer: Self-pay | Admitting: Physician Assistant

## 2012-01-07 ENCOUNTER — Telehealth: Payer: Self-pay | Admitting: Oncology

## 2012-01-07 ENCOUNTER — Ambulatory Visit (HOSPITAL_BASED_OUTPATIENT_CLINIC_OR_DEPARTMENT_OTHER): Payer: Medicare Other | Admitting: Physician Assistant

## 2012-01-07 ENCOUNTER — Ambulatory Visit: Payer: Medicare Other | Admitting: Nutrition

## 2012-01-07 ENCOUNTER — Other Ambulatory Visit (HOSPITAL_BASED_OUTPATIENT_CLINIC_OR_DEPARTMENT_OTHER): Payer: Medicare Other | Admitting: Lab

## 2012-01-07 VITALS — BP 176/71 | HR 60 | Temp 98.2°F | Resp 20 | Ht 65.5 in | Wt 119.7 lb

## 2012-01-07 DIAGNOSIS — Z853 Personal history of malignant neoplasm of breast: Secondary | ICD-10-CM

## 2012-01-07 DIAGNOSIS — C50919 Malignant neoplasm of unspecified site of unspecified female breast: Secondary | ICD-10-CM | POA: Diagnosis not present

## 2012-01-07 DIAGNOSIS — Z170 Estrogen receptor positive status [ER+]: Secondary | ICD-10-CM | POA: Diagnosis not present

## 2012-01-07 DIAGNOSIS — N6310 Unspecified lump in the right breast, unspecified quadrant: Secondary | ICD-10-CM

## 2012-01-07 LAB — CBC WITH DIFFERENTIAL/PLATELET
BASO%: 0.4 % (ref 0.0–2.0)
EOS%: 0.9 % (ref 0.0–7.0)
HCT: 34.8 % (ref 34.8–46.6)
LYMPH%: 21 % (ref 14.0–49.7)
MCH: 30.6 pg (ref 25.1–34.0)
MCHC: 34.7 g/dL (ref 31.5–36.0)
NEUT%: 70.7 % (ref 38.4–76.8)
Platelets: 242 10*3/uL (ref 145–400)
RBC: 3.95 10*6/uL (ref 3.70–5.45)
lymph#: 1.1 10*3/uL (ref 0.9–3.3)

## 2012-01-07 LAB — COMPREHENSIVE METABOLIC PANEL (CC13)
ALT: 12 U/L (ref 0–55)
AST: 16 U/L (ref 5–34)
Alkaline Phosphatase: 88 U/L (ref 40–150)
CO2: 26 mEq/L (ref 22–29)
Creatinine: 0.7 mg/dL (ref 0.6–1.1)
Sodium: 138 mEq/L (ref 136–145)
Total Bilirubin: 0.6 mg/dL (ref 0.20–1.20)
Total Protein: 7 g/dL (ref 6.4–8.3)

## 2012-01-07 NOTE — Progress Notes (Signed)
ID: Caroline Olson   DOB: January 13, 1941  MR#: [id number]  YQM#:[id number]  PCP: Caroline Primes, MD GYN:  SU:  OTHER MD:   HISTORY OF PRESENT ILLNESS: Caroline Olson herself palpated a mass in her left breast sometime in July of 2011.  She brought it to Dr. Rondel Olson attention and the patient had left mammography and ultrasonography October 04, 2009 at [facility].  Dr. Azucena Olson was able to palpate a faint mass in the 12 o'clock position of the left breast which by mammography was near a stable rounded mass-like density containing layering calcifications.  The additional mass was mammographically visible as separate from that stable mass.  By ultrasound, the stable mass was a 1.9 cm cyst with depending calcification.  The other mass measuring 1.8 cm was oval, smoothly marginated, horizontally oriented and hypoechoic, with a small amount of course calcifications.    Dr. Azucena Olson felt that repeat mammography in 5 months to assess stability would be reasonable and this was in fact performed February 25, 2010.  Again, the breast was dense but there was no suspicious mass or malignant type microcalcification identified in either breast by Dr. Judyann Olson.  Ultrasound showed a hypoechoic mass with microlobulated borders in the area in question, with increased through transmission.  It measured 1.7 cm here and had not changed in size, but she felt it was slightly suspicious on this ultrasound.  Of course, this is quite separate from the stable simple cyst nearby.  Accordingly, Dr. Judyann Olson proceeded to ultrasound-guided biopsy of the mass on November 28th and this showed ([id number]) an intraductal papilloma with atypical apocrine hyperplasia, ER positive at 14%, PR negative, HER-2/neu negative.  With this information, the patient was referred to Dr. Magnus Olson and he recommended removal of this mass which was performed March 12, 2010.  However, this excisional biopsy showed ductal carcinoma in situ with apocrine features involving an  intraductal papilloma.  This spanned 1.5 cm and was described as low-grade.  The margins, however, were non-oriented and broadly involved.    Accordingly, the patient was brought back for a second procedure January 5th.  This ([id number]) showed only fibrocystic changes with calcifications.  No evidence of malignancy.    Caroline Olson underwent radiation therapy completed in mid March of 2012.  She decided against anti-estrogen therapy, the plan being to follow with observation alone.  INTERVAL HISTORY: Caroline Olson returns today for routine one-year followup of her left breast carcinoma. Interval history is unremarkable, and she is feeling well. She and her husband enjoyed some traveling over the summer, visiting [place] on vacation, and traveling to [place] to visit family.  REVIEW OF SYSTEMS: Caroline Olson has had no recent illnesses and denies fevers, chills, or night sweats. She's exercising regularly. Her energy level is good. She's had no rashes or skin changes. I will mention that when seen here in July of 2012 she was having some itching in the breast, but this has resolved. She's had no signs of abnormal bleeding. She's eating well with no nausea or change in bowel habits. No cough, shortness of breath, or chest pain. She's had no abnormal headaches or dizziness. She's been diagnosed with very early signs of macular degeneration. She denies any unusual myalgias, arthralgias, or bony pain.  A detailed review of systems is otherwise noncontributory.   PAST MEDICAL HISTORY: No past medical history on file. Significant for osteopenia, benign thyroid biopsy in 2007, history of benign left breast biopsy previously in 2001.  History of appendectomy, history of hysterectomy and bilateral salpingo-oophorectomy  at age 43.  History of biceps tendon repair remotely.  History of left rotator cuff surgery.  History of early cataracts not yet requiring any intervention.  History of mild osteoarthritis symptoms.   PAST SURGICAL  HISTORY: Past Surgical History  Procedure Date  . Appendectomy   . Abdominal hysterectomy   . Shoulder surgery 2003    tear    FAMILY HISTORY Family History  Problem Relation Age of Onset  . Cancer Mother   . Heart disease Father   . Diabetes Father   The patient's father died with congestive heart failure at the age of 72.  The patient's mother died with pancreatic cancer at the age of 65.  The patient's mother had four sisters; two of them had breast cancer but diagnosed at advanced ages:  One of them at the age of 65, for example.  The patient has one sister.  There is no breast or ovarian cancer in any first-degree relative.    GYN HISTORY:  She is GX P2, first pregnancy to term at age 26, menarche age 61.  The patient has been on estrogen replacement since her surgery at age 39.  (She continues on Vivelle Patches at this point.)    SOCIAL HISTORY:  She worked remotely as an Public house manager; later became a Sales executive.  Now mostly volunteers at church and at a local school.  Her husband, Caroline Olson, used to work for [employer].  He is now retired.  They have been married more than 50 years.  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  Caroline Olson lives in [place].  She is unfortunately a widow, also a Sport and exercise psychologist, though currently a homemaker.  She has two daughters at home.  The patient attends the [employer].     ADVANCED DIRECTIVES:  HEALTH MAINTENANCE: History  Substance Use Topics  . Smoking status: Never Smoker   . Smokeless tobacco: Never Used  . Alcohol Use: 2.5 oz/week    5 drink(s) per week     Colonoscopy: approx 2003, Dr. Loreta Olson  PAP:  Bone density:   Lipid panel: UTD, Dr. Posey Olson   Allergies  Allergen Reactions  . Aspirin     REACTION: ringing in ears  . Codeine Sulfate     Current Outpatient Prescriptions  Medication Sig Dispense Refill  . Calcium Carbonate-Vitamin  D (CALCIUM + D) 600-200 MG-UNIT TABS Take by mouth daily.      . cholecalciferol (VITAMIN D) 1000 UNITS tablet Take 1,000 Units by mouth daily.      . fexofenadine (ALLEGRA) 180 MG tablet Take 180 mg by mouth daily.      . fish oil-omega-3 fatty acids 1000 MG capsule Take 2 g by mouth daily.       . Glucosamine Sulfate 500 MG TABS Take by mouth 2 (two) times daily.      Marland Kitchen ibuprofen (ADVIL,MOTRIN) 200 MG tablet Take 200 mg by mouth every 6 (six) hours as needed.        . metronidazole (NORITATE) 1 % cream Apply topically daily.        . Multiple Vitamins-Minerals (MULTIVITAMIN WITH MINERALS) tablet Take 1 tablet by mouth 3 (three) times a week.       . Multiple Vitamins-Minerals (PRESERVISION AREDS PO) Take 2 capsules by mouth daily.      Marland Kitchen tretinoin (RETIN-A) 0.05 % cream Apply topically at bedtime.  OBJECTIVE: Middle-aged white female who appears comfortable and in no acute distress  Filed Vitals:   01/07/12 1325  BP: 176/71  Pulse: 60  Temp: 98.2 F (36.8 C)  Resp: 20     Body mass index is 19.62 kg/(m^2).    ECOG FS:  0 Filed Weights   01/07/12 1325  Weight: 119 lb 11.2 oz (54.296 kg)   Sclerae unicteric Oropharynx clear No cervical or supraclavicular adenopathy Lungs no rales or rhonchi Heart regular rate and rhythm Abd soft, nontender, with positive bowel sounds  MSK no focal spinal tenderness,  No peripheral edema Neuro: nonfocal; Alert and oriented x3 ,  Breasts: In the right breast I palpate a mass like abnormality in the lateral edge at 9:00. There no obvious skin changes noted and no nipple inversion. The left breast is status post lumpectomy with no evidence of disease recurrence. Axillae are benign bilaterally with no adenopathy   LAB RESULTS: Lab Results  Component Value Date   WBC 5.1 01/07/2012   NEUTROABS 3.6 01/07/2012   HGB 12.1 01/07/2012   HCT 34.8 01/07/2012   MCV 88.1 01/07/2012   PLT 242 01/07/2012      Chemistry      Component Value Date/Time    NA 138 01/07/2012 1307   NA 139 07/10/2011 0934   K 4.1 01/07/2012 1307   K 4.3 07/10/2011 0934   CL 102 01/07/2012 1307   CL 103 07/10/2011 0934   CO2 26 01/07/2012 1307   CO2 29 07/10/2011 0934   BUN 13.0 01/07/2012 1307   BUN 14 07/10/2011 0934   CREATININE 0.7 01/07/2012 1307   CREATININE 0.6 07/10/2011 0934      Component Value Date/Time   CALCIUM 9.9 01/07/2012 1307   CALCIUM 9.6 07/10/2011 0934   ALKPHOS 88 01/07/2012 1307   ALKPHOS 69 07/10/2011 0934   AST 16 01/07/2012 1307   AST 18 07/10/2011 0934   ALT 12 01/07/2012 1307   ALT 14 07/10/2011 0934   BILITOT 0.60 01/07/2012 1307   BILITOT 0.7 07/10/2011 0934       Lab Results  Component Value Date   LABCA2 19 09/30/2010     STUDIES: This recent mammogram was 02/27/2011 and was unremarkable   ASSESSMENT: 71 y.o.  old [place], [place] woman   (1)  status post excisional biopsy in December of 2011 for 1.5 cm low-grade invasive ductal carcinoma with apocrine features.  ER positive at 14%, PR negative.  HER-2/neu negative.    (2)  Re-excision of margins in early January of 2012 showed no residual disease.    (3)  Status post radiation therapy completed in mid March of 2012.  Has decided against anti-estrogen therapy, the plan being to follow with observation alone.  PLAN: I am referring Caroline Olson back to the breast Center for a right diagnostic mammogram with right breast ultrasound to evaluate the abnormality I palpate in the lateral portion of the right breast. Assuming this comes back normal, she will be due for her bilateral diagnostic mammogram in November. She would see [place] again in one year, or prior to that time if she has any problems. Of course if the diagnostic mammogram/ultrasound indicate a problem in the right breast, we will see her back immediately.  Caroline Olson voices understanding and agreement with this plan, and will call with any changes or problems.  Caroline Olson    01/07/2012

## 2012-01-07 NOTE — Progress Notes (Signed)
Caroline Olson is a 71 year old female patient of Dr. Darnelle Catalan diagnosed with breast cancer.  Past medical history includes osteopenia.  Medications include calcium with vitamin D, fish oil, glucosamine, multivitamin.  Labs were reviewed.  Height: 65.5 inches. Weight: 120.38 pounds. BMI: 19.72.  Patient states she has been following a 1400-calorie diet using an application on line called "My Fitness Pal". She has managed to maintain her current weight of 120 pounds for the last 5 or 6 months. She would like to continue to weigh approximately 120 pounds but does not want to lose any weight. She would like me to evaluate the current ratio of macronutrients in her present diet.  Nutrition diagnosis: Food and nutrition related knowledge deficit related to lack of prior exposure to detailed nutrition information as evidenced by patient verbalizing incomplete information  Intervention: I educated patient to adjust macronutrient distribution to reflect approximately 20-25% of her calories from fat. She is to strive for approximately 20% of her calories from protein. This will leave her approximately 55-60% of her calories from carbohydrate. I have also educated patient to add a protein source at breakfast and to aim for distribution of approximately 300 calories at breakfast and lunch. She should aim for approximately 400 calories at dinner. This leaves her approximately 200 calories for a midafternoon and bedtime snack. I educated patient that increasing protein at breakfast may help with satiety later on in the day. Patient was able to teach back strategies for adding protein at breakfast and changing the distribution of her macronutrients in her diet. I encouraged her to continue exercise as tolerated.  Monitoring, evaluation, goals: Patient will continue calorie-controlled diet with exercise to promote weight maintenance. She will attempt to increase overall protein intake and decrease total fat.  Next  visit: Patient will contact me for questions or concerns.

## 2012-01-07 NOTE — Telephone Encounter (Signed)
gve the pt her oct 2014 appt calendar along with the mammo/us appt for oct

## 2012-01-08 LAB — CANCER ANTIGEN 27.29: CA 27.29: 16 U/mL (ref 0–39)

## 2012-01-13 ENCOUNTER — Other Ambulatory Visit: Payer: Self-pay | Admitting: Oncology

## 2012-01-13 ENCOUNTER — Ambulatory Visit
Admission: RE | Admit: 2012-01-13 | Discharge: 2012-01-13 | Disposition: A | Discharge status: Home / Self Care | Payer: Medicare Other | Source: Ambulatory Visit | Attending: Physician Assistant | Admitting: Physician Assistant

## 2012-01-13 ENCOUNTER — Other Ambulatory Visit: Payer: Self-pay | Admitting: Physician Assistant

## 2012-01-13 DIAGNOSIS — N6310 Unspecified lump in the right breast, unspecified quadrant: Secondary | ICD-10-CM

## 2012-01-13 DIAGNOSIS — Z853 Personal history of malignant neoplasm of breast: Secondary | ICD-10-CM | POA: Diagnosis not present

## 2012-01-13 DIAGNOSIS — N63 Unspecified lump in unspecified breast: Secondary | ICD-10-CM | POA: Diagnosis not present

## 2012-01-21 ENCOUNTER — Ambulatory Visit
Admission: RE | Admit: 2012-01-21 | Discharge: 2012-01-21 | Disposition: A | Discharge status: Home / Self Care | Payer: Medicare Other | Source: Ambulatory Visit | Attending: Physician Assistant | Admitting: Physician Assistant

## 2012-01-21 ENCOUNTER — Other Ambulatory Visit: Payer: Self-pay | Admitting: Physician Assistant

## 2012-01-21 DIAGNOSIS — N6310 Unspecified lump in the right breast, unspecified quadrant: Secondary | ICD-10-CM

## 2012-01-21 DIAGNOSIS — Z853 Personal history of malignant neoplasm of breast: Secondary | ICD-10-CM

## 2012-01-21 DIAGNOSIS — N6009 Solitary cyst of unspecified breast: Secondary | ICD-10-CM | POA: Diagnosis not present

## 2012-01-23 DIAGNOSIS — H04129 Dry eye syndrome of unspecified lacrimal gland: Secondary | ICD-10-CM | POA: Diagnosis not present

## 2012-02-02 ENCOUNTER — Other Ambulatory Visit: Payer: Self-pay | Admitting: Obstetrics & Gynecology

## 2012-02-02 DIAGNOSIS — Z853 Personal history of malignant neoplasm of breast: Secondary | ICD-10-CM

## 2012-02-02 DIAGNOSIS — Z9889 Other specified postprocedural states: Secondary | ICD-10-CM

## 2012-03-01 ENCOUNTER — Ambulatory Visit
Admission: RE | Admit: 2012-03-01 | Discharge: 2012-03-01 | Disposition: A | Discharge status: Home / Self Care | Payer: Medicare Other | Source: Ambulatory Visit | Attending: Obstetrics & Gynecology | Admitting: Obstetrics & Gynecology

## 2012-03-01 DIAGNOSIS — Z853 Personal history of malignant neoplasm of breast: Secondary | ICD-10-CM | POA: Diagnosis not present

## 2012-03-01 DIAGNOSIS — Z9889 Other specified postprocedural states: Secondary | ICD-10-CM

## 2012-03-11 DIAGNOSIS — H353190 Nonexudative age-related macular degeneration, unspecified eye, stage unspecified: Secondary | ICD-10-CM | POA: Diagnosis not present

## 2012-03-15 DIAGNOSIS — L570 Actinic keratosis: Secondary | ICD-10-CM | POA: Diagnosis not present

## 2012-03-15 DIAGNOSIS — L719 Rosacea, unspecified: Secondary | ICD-10-CM | POA: Diagnosis not present

## 2012-03-15 DIAGNOSIS — D2370 Other benign neoplasm of skin of unspecified lower limb, including hip: Secondary | ICD-10-CM | POA: Diagnosis not present

## 2012-03-15 DIAGNOSIS — D239 Other benign neoplasm of skin, unspecified: Secondary | ICD-10-CM | POA: Diagnosis not present

## 2012-03-15 DIAGNOSIS — D1801 Hemangioma of skin and subcutaneous tissue: Secondary | ICD-10-CM | POA: Diagnosis not present

## 2012-03-15 DIAGNOSIS — L253 Unspecified contact dermatitis due to other chemical products: Secondary | ICD-10-CM | POA: Diagnosis not present

## 2012-03-15 DIAGNOSIS — L821 Other seborrheic keratosis: Secondary | ICD-10-CM | POA: Diagnosis not present

## 2012-03-31 DIAGNOSIS — D126 Benign neoplasm of colon, unspecified: Secondary | ICD-10-CM

## 2012-03-31 HISTORY — PX: COLONOSCOPY W/ POLYPECTOMY: SHX1380

## 2012-03-31 HISTORY — DX: Benign neoplasm of colon, unspecified: D12.6

## 2012-04-08 DIAGNOSIS — Z124 Encounter for screening for malignant neoplasm of cervix: Secondary | ICD-10-CM | POA: Diagnosis not present

## 2012-04-08 DIAGNOSIS — R350 Frequency of micturition: Secondary | ICD-10-CM | POA: Diagnosis not present

## 2012-04-08 DIAGNOSIS — Z01419 Encounter for gynecological examination (general) (routine) without abnormal findings: Secondary | ICD-10-CM | POA: Diagnosis not present

## 2012-04-19 DIAGNOSIS — Z23 Encounter for immunization: Secondary | ICD-10-CM | POA: Diagnosis not present

## 2012-07-01 ENCOUNTER — Encounter: Payer: Self-pay | Admitting: Internal Medicine

## 2012-07-05 ENCOUNTER — Encounter: Payer: Self-pay | Admitting: Internal Medicine

## 2012-07-08 ENCOUNTER — Encounter: Payer: Self-pay | Admitting: Internal Medicine

## 2012-07-08 ENCOUNTER — Ambulatory Visit (INDEPENDENT_AMBULATORY_CARE_PROVIDER_SITE_OTHER)
Admission: RE | Admit: 2012-07-08 | Discharge: 2012-07-08 | Disposition: A | Discharge status: Home / Self Care | Payer: Medicare Other | Source: Ambulatory Visit | Attending: Internal Medicine | Admitting: Internal Medicine

## 2012-07-08 ENCOUNTER — Ambulatory Visit (INDEPENDENT_AMBULATORY_CARE_PROVIDER_SITE_OTHER): Payer: Medicare Other | Admitting: Internal Medicine

## 2012-07-08 VITALS — BP 140/80 | HR 80 | Temp 98.0°F | Resp 16 | Ht 65.5 in | Wt 127.0 lb

## 2012-07-08 DIAGNOSIS — C50919 Malignant neoplasm of unspecified site of unspecified female breast: Secondary | ICD-10-CM

## 2012-07-08 DIAGNOSIS — Z853 Personal history of malignant neoplasm of breast: Secondary | ICD-10-CM | POA: Diagnosis not present

## 2012-07-08 DIAGNOSIS — R066 Hiccough: Secondary | ICD-10-CM

## 2012-07-08 DIAGNOSIS — I83893 Varicose veins of bilateral lower extremities with other complications: Secondary | ICD-10-CM | POA: Diagnosis not present

## 2012-07-08 DIAGNOSIS — E785 Hyperlipidemia, unspecified: Secondary | ICD-10-CM

## 2012-07-08 DIAGNOSIS — R0602 Shortness of breath: Secondary | ICD-10-CM | POA: Diagnosis not present

## 2012-07-08 DIAGNOSIS — C50912 Malignant neoplasm of unspecified site of left female breast: Secondary | ICD-10-CM

## 2012-07-08 DIAGNOSIS — Z0000 Encounter for general adult medical examination without abnormal findings: Secondary | ICD-10-CM | POA: Diagnosis not present

## 2012-07-08 NOTE — Assessment & Plan Note (Signed)
CXR [place]

## 2012-07-08 NOTE — Assessment & Plan Note (Signed)
CXR

## 2012-07-08 NOTE — Assessment & Plan Note (Signed)

## 2012-07-08 NOTE — Progress Notes (Signed)
   Subjective:    HPI  The patient is here for a wellness exam. The patient has been doing well overall without major physical or psychological issues going on lately except for L br Ca in situ 2012   C/o hiccups while eating - mild when eating x 2-3 mo C/o some memory issues - mild  Wt Readings from Last 3 Encounters:  07/08/12 127 lb (57.607 kg)  01/07/12 119 lb 11.2 oz (54.296 kg)  09/15/11 120 lb 6 oz (54.602 kg)   BP Readings from Last 3 Encounters:  07/08/12 170/92  01/07/12 176/71  09/15/11 120/82      Review of Systems  Constitutional: Negative for fever, chills, diaphoresis, activity change, appetite change, fatigue and unexpected weight change.  HENT: Negative for hearing loss, ear pain, congestion, sore throat, sneezing, mouth sores, neck pain, dental problem, voice change, postnasal drip and sinus pressure.   Eyes: Negative for pain and visual disturbance.  Respiratory: Negative for cough, chest tightness, wheezing and stridor.   Cardiovascular: Negative for chest pain, palpitations and leg swelling.  Gastrointestinal: Negative for nausea, vomiting, abdominal pain, blood in stool, abdominal distention and rectal pain.  Genitourinary: Negative for dysuria, hematuria, decreased urine volume, vaginal bleeding, vaginal discharge, difficulty urinating, vaginal pain and menstrual problem.  Musculoskeletal: Negative for back pain, joint swelling and gait problem.  Skin: Negative for color change, rash and wound.  Neurological: Negative for dizziness, tremors, syncope, speech difficulty and light-headedness.  Hematological: Negative for adenopathy.  Psychiatric/Behavioral: Negative for suicidal ideas, hallucinations, behavioral problems, confusion, sleep disturbance, dysphoric mood and decreased concentration. The patient is not hyperactive.        Objective:   Physical Exam  Constitutional: She appears well-developed. No distress.  HENT:  Head: Normocephalic.  Right  Ear: External ear normal.  Left Ear: External ear normal.  Nose: Nose normal.  Mouth/Throat: Oropharynx is clear and moist.  Eyes: Conjunctivae are normal. Pupils are equal, round, and reactive to light. Right eye exhibits no discharge. Left eye exhibits no discharge.  Neck: Normal range of motion. Neck supple. No JVD present. No tracheal deviation present. No thyromegaly present.  Cardiovascular: Normal rate, regular rhythm and normal heart sounds.   Pulmonary/Chest: No stridor. No respiratory distress. She has no wheezes.  Abdominal: Soft. Bowel sounds are normal. She exhibits no distension and no mass. There is no tenderness. There is no rebound and no guarding.  Musculoskeletal: She exhibits no edema and no tenderness.  Lymphadenopathy:    She has no cervical adenopathy.  Neurological: She displays normal reflexes. No cranial nerve deficit. She exhibits normal muscle tone. Coordination normal.  Skin: No rash noted. No erythema.  Psychiatric: She has a normal mood and affect. Her behavior is normal. Judgment and thought content normal.   Lab Results  Component Value Date   WBC 5.1 01/07/2012   HGB 12.1 01/07/2012   HCT 34.8 01/07/2012   PLT 242 01/07/2012   GLUCOSE 91 01/07/2012   CHOL 196 07/10/2011   TRIG 25.0 07/10/2011   HDL 80.40 07/10/2011   LDLDIRECT 104.3 06/07/2008   LDLCALC 111* 07/10/2011   ALT 12 01/07/2012   AST 16 01/07/2012   NA 138 01/07/2012   K 4.1 01/07/2012   CL 102 01/07/2012   CREATININE 0.7 01/07/2012   BUN 13.0 01/07/2012   CO2 26 01/07/2012   TSH 0.62 07/10/2011          Assessment & Plan:

## 2012-07-08 NOTE — Assessment & Plan Note (Signed)
Vasc cons was offered

## 2012-07-15 ENCOUNTER — Encounter: Payer: Self-pay | Admitting: Internal Medicine

## 2012-07-15 ENCOUNTER — Other Ambulatory Visit (INDEPENDENT_AMBULATORY_CARE_PROVIDER_SITE_OTHER): Payer: Medicare Other

## 2012-07-15 DIAGNOSIS — R066 Hiccough: Secondary | ICD-10-CM | POA: Diagnosis not present

## 2012-07-15 DIAGNOSIS — I83893 Varicose veins of bilateral lower extremities with other complications: Secondary | ICD-10-CM

## 2012-07-15 DIAGNOSIS — E785 Hyperlipidemia, unspecified: Secondary | ICD-10-CM | POA: Diagnosis not present

## 2012-07-15 DIAGNOSIS — Z0000 Encounter for general adult medical examination without abnormal findings: Secondary | ICD-10-CM

## 2012-07-15 DIAGNOSIS — C50912 Malignant neoplasm of unspecified site of left female breast: Secondary | ICD-10-CM

## 2012-07-15 DIAGNOSIS — C50919 Malignant neoplasm of unspecified site of unspecified female breast: Secondary | ICD-10-CM

## 2012-07-15 LAB — BASIC METABOLIC PANEL
Calcium: 9.6 mg/dL (ref 8.4–10.5)
GFR: 106.01 mL/min (ref >60.00)
Sodium: 138 mEq/L (ref 135–145)

## 2012-07-15 LAB — LIPID PANEL
HDL: 74.2 mg/dL (ref >39.00)
LDL Cholesterol: 117 mg/dL — ABNORMAL HIGH (ref 0–99)
Total CHOL/HDL Ratio: 3
VLDL: 7.4 mg/dL (ref 0.0–40.0)

## 2012-07-15 LAB — HEPATIC FUNCTION PANEL
Alkaline Phosphatase: 70 U/L (ref 39–117)
Bilirubin, Direct: 0.1 mg/dL (ref 0.0–0.3)
Total Protein: 7.2 g/dL (ref 6.0–8.3)

## 2012-07-15 LAB — TSH: TSH: 0.82 u[IU]/mL (ref 0.35–5.50)

## 2012-07-15 LAB — URINALYSIS
Hgb urine dipstick: NEGATIVE
Nitrite: NEGATIVE
Urobilinogen, UA: 0.2 (ref 0.0–1.0)

## 2012-07-22 ENCOUNTER — Ambulatory Visit: Payer: Medicare Other | Admitting: Nutrition

## 2012-07-22 NOTE — Progress Notes (Signed)
Patient presents to nutrition follow up.  Patient concerned that she has gained a few pounds and requests a "refresher" on previous recommendations. Weight has increased to 127.4 pounds.  Patient remains within a healthy body weight for height.    Nutrition diagnosis:  Food and Nutrition related knowledge deficit continues  Intervention:  I educated patient to resume electronic food journal.  I recommended patient consume 1400 calories daily with approximately 20% protein, 55-60% carbohydrates, and 20-25% fat. I recommended patient continue to walk for exercise and increase weight training to 3 days weekly.  Monitoring, evaluation, goals:  Patient will increase exercise and limit total calories to a total of 1400 daily to maintain a healthy weight.  Next visit:  No follow up scheduled.

## 2012-07-30 ENCOUNTER — Telehealth: Payer: Self-pay

## 2012-07-30 DIAGNOSIS — I8390 Asymptomatic varicose veins of unspecified lower extremity: Secondary | ICD-10-CM

## 2012-07-30 NOTE — Telephone Encounter (Signed)
done

## 2012-07-30 NOTE — Telephone Encounter (Signed)
Pt called requesting a referral to Dr Arbie Cookey at [facility] for evaluation of varicose veins

## 2012-08-03 ENCOUNTER — Other Ambulatory Visit: Payer: Self-pay

## 2012-08-03 DIAGNOSIS — I83893 Varicose veins of bilateral lower extremities with other complications: Secondary | ICD-10-CM

## 2012-08-16 DIAGNOSIS — S63509A Unspecified sprain of unspecified wrist, initial encounter: Secondary | ICD-10-CM | POA: Diagnosis not present

## 2012-08-20 ENCOUNTER — Ambulatory Visit (INDEPENDENT_AMBULATORY_CARE_PROVIDER_SITE_OTHER): Payer: Medicare Other | Admitting: Surgery

## 2012-08-25 ENCOUNTER — Ambulatory Visit (AMBULATORY_SURGERY_CENTER): Payer: Medicare Other | Admitting: *Deleted

## 2012-08-25 VITALS — Ht 65.5 in | Wt 127.2 lb

## 2012-08-25 DIAGNOSIS — Z1211 Encounter for screening for malignant neoplasm of colon: Secondary | ICD-10-CM

## 2012-08-25 MED ORDER — MOVIPREP 100 G PO SOLR: ORAL | Status: DC

## 2012-08-25 NOTE — Progress Notes (Signed)
No allergies to eggs or soy per pt. 

## 2012-09-10 ENCOUNTER — Encounter: Payer: Self-pay | Admitting: Internal Medicine

## 2012-09-10 ENCOUNTER — Ambulatory Visit (AMBULATORY_SURGERY_CENTER): Payer: Medicare Other | Admitting: Internal Medicine

## 2012-09-10 VITALS — BP 135/52 | HR 55 | Temp 96.3°F | Resp 18 | Ht 65.0 in | Wt 127.0 lb

## 2012-09-10 DIAGNOSIS — Z1211 Encounter for screening for malignant neoplasm of colon: Secondary | ICD-10-CM

## 2012-09-10 DIAGNOSIS — M1990 Unspecified osteoarthritis, unspecified site: Secondary | ICD-10-CM | POA: Diagnosis not present

## 2012-09-10 DIAGNOSIS — E049 Nontoxic goiter, unspecified: Secondary | ICD-10-CM | POA: Diagnosis not present

## 2012-09-10 DIAGNOSIS — D126 Benign neoplasm of colon, unspecified: Secondary | ICD-10-CM

## 2012-09-10 MED ORDER — SODIUM CHLORIDE 0.9 % IV SOLN: 500.0000 mL | INTRAVENOUS | Status: DC

## 2012-09-10 NOTE — Progress Notes (Signed)
Patient did not have preoperative order for IV antibiotic SSI prophylaxis. (G8918)  Patient did not experience any of the following events: a burn prior to discharge; a fall within the facility; wrong site/side/patient/procedure/implant event; or a hospital transfer or hospital admission upon discharge from the facility. (G8907)  

## 2012-09-10 NOTE — Progress Notes (Signed)
Awake, to recovery and report given. VSS 

## 2012-09-10 NOTE — Op Note (Signed)
[facility] [address] [place], [address]   COLONOSCOPY PROCEDURE REPORT  PATIENT: Chastity, Noland  MR#: [id number] BIRTHDATE: June 28, 1940 , 71  yrs. old GENDER: Female ENDOSCOPIST: Hart Carwin, MD REFERRED BY:  Janeal Holmes, M.D. PROCEDURE DATE:  09/10/2012 PROCEDURE:   Colonoscopy with cold biopsy polypectomy ASA CLASS:   Class II INDICATIONS:Average risk patient for colon cancer and last colonoscopy in 2003 was normal. MEDICATIONS: MAC sedation, administered by CRNA and Propofol (Diprivan) 230 mg IV  DESCRIPTION OF PROCEDURE:   After the risks and benefits and of the procedure were explained, informed consent was obtained.  A digital rectal exam revealed no abnormalities of the rectum.    The [id number]  endoscope was introduced through the anus and advanced to the cecum, which was identified by both the appendix and ileocecal valve .  The quality of the prep was good, using MoviPrep .  The instrument was then slowly withdrawn as the colon was fully examined.     COLON FINDINGS: A firm sessile polyp ranging between 3-60mm in size was found in the cecum  A polypectomy was performed with cold forceps.  The resection was complete and the polyp tissue was completely retrieved.     Retroflexed views revealed no abnormalities.     The scope was then withdrawn from the patient and the procedure completed.  COMPLICATIONS: There were no complications. ENDOSCOPIC IMPRESSION: Sessile polyp ranging between 3-8mm in size was found in the cecum ; polypectomy was performed with cold forceps  RECOMMENDATIONS: 1.  Await pathology results 2.  High fiber diet   REPEAT EXAM: In 10 year(s)  for Colonoscopy. pending pathology  cc:  _______________________________ eSigned:  Hart Carwin, MD 09/10/2012 9:40 AM     PATIENT NAME:  Lonnette, Shrode MR#: [id number]

## 2012-09-10 NOTE — Patient Instructions (Addendum)
YOU HAD AN ENDOSCOPIC PROCEDURE TODAY AT THE [facility]: Refer to the procedure report that was given to you for any specific questions about what was found during the examination.  If the procedure report does not answer your questions, please call your gastroenterologist to clarify.  If you requested that your care partner not be given the details of your procedure findings, then the procedure report has been included in a sealed envelope for you to review at your convenience later.  YOU SHOULD EXPECT: Some feelings of bloating in the abdomen. Passage of more gas than usual.  Walking can help get rid of the air that was put into your GI tract during the procedure and reduce the bloating. If you had a lower endoscopy (such as a colonoscopy or flexible sigmoidoscopy) you may notice spotting of blood in your stool or on the toilet paper. If you underwent a bowel prep for your procedure, then you may not have a normal bowel movement for a few days.  DIET: Your first meal following the procedure should be a light meal and then it is ok to progress to your normal diet.  A half-sandwich or bowl of soup is an example of a good first meal.  Heavy or fried foods are harder to digest and may make you feel nauseous or bloated.  Likewise meals heavy in dairy and vegetables can cause extra gas to form and this can also increase the bloating.  Drink plenty of fluids but you should avoid alcoholic beverages for 24 hours.  ACTIVITY: Your care partner should take you home directly after the procedure.  You should plan to take it easy, moving slowly for the rest of the day.  You can resume normal activity the day after the procedure however you should NOT DRIVE or use heavy machinery for 24 hours (because of the sedation medicines used during the test).    SYMPTOMS TO REPORT IMMEDIATELY: A gastroenterologist can be reached at any hour.  During normal business hours, 8:30 AM to 5:00 PM Monday through Friday,  call [phone].  After hours and on weekends, please call the GI answering service at [phone] who will take a message and have the physician on call contact you.   Following lower endoscopy (colonoscopy or flexible sigmoidoscopy):  Excessive amounts of blood in the stool  Significant tenderness or worsening of abdominal pains  Swelling of the abdomen that is new, acute  Fever of 100F or higher  FOLLOW UP: If any biopsies were taken you will be contacted by phone or by letter within the next 1-3 weeks.  Call your gastroenterologist if you have not heard about the biopsies in 3 weeks.  Our staff will call the home number listed on your records the next business day following your procedure to check on you and address any questions or concerns that you may have at that time regarding the information given to you following your procedure. This is a courtesy call and so if there is no answer at the home number and we have not heard from you through the emergency physician on call, we will assume that you have returned to your regular daily activities without incident.  SIGNATURES/CONFIDENTIALITY: You and/or your care partner have signed paperwork which will be entered into your electronic medical record.  These signatures attest to the fact that that the information above on your After Visit Summary has been reviewed and is understood.  Full responsibility of the confidentiality of this   discharge information lies with you and/or your care-partner.  Await pathology results High fiber diet 

## 2012-09-10 NOTE — Progress Notes (Signed)
Called to room to assist during endoscopic procedure.  Patient ID and intended procedure confirmed with present staff. Received instructions for my participation in the procedure from the performing physician.  

## 2012-09-13 ENCOUNTER — Telehealth: Payer: Self-pay | Admitting: *Deleted

## 2012-09-13 NOTE — Telephone Encounter (Signed)
  Follow up Call-  Call back number 09/10/2012  Post procedure Call Back phone  # [phone]  Permission to leave phone message Yes     Patient questions:  Do you have a fever, pain , or abdominal swelling? yes Pain Score  0 *  Have you tolerated food without any problems? no  Have you been able to return to your normal activities? yes  Do you have any questions about your discharge instructions: Diet   no Medications  no Follow up visit  no  Do you have questions or concerns about your Care? yes  Actions: * If pain score is 4 or above: No action needed, pain <4.   Patient ate potatoes and beef plus beans and complained of gas.  I told her to stay on a bland diet until  The gas is gone.

## 2012-09-14 ENCOUNTER — Encounter: Payer: Self-pay | Admitting: Internal Medicine

## 2012-09-28 ENCOUNTER — Encounter (INDEPENDENT_AMBULATORY_CARE_PROVIDER_SITE_OTHER): Payer: Self-pay | Admitting: Surgery

## 2012-09-28 ENCOUNTER — Ambulatory Visit (INDEPENDENT_AMBULATORY_CARE_PROVIDER_SITE_OTHER): Payer: Medicare Other | Admitting: Surgery

## 2012-09-28 VITALS — BP 118/68 | HR 68 | Temp 98.0°F | Resp 18 | Ht 65.0 in | Wt 128.0 lb

## 2012-09-28 DIAGNOSIS — Z853 Personal history of malignant neoplasm of breast: Secondary | ICD-10-CM | POA: Diagnosis not present

## 2012-09-28 NOTE — Progress Notes (Signed)
Subjective:     Patient ID: Caroline Olson, female   DOB: 02-11-1941, 72 y.o.   MRN: [id number]  HPI She is here for yearly followup status post left breast lumpectomy for ductal carcinoma in situ in 2011. She has no complaints. She denies nipple discharge. Her last mammograms in November were normal  Review of Systems     Objective:   Physical Exam On exam, the incision is well healed left breast. There are no palpable masses in either breast and no left axillary adenopathy    Assessment:     Long-term followup ductal carcinoma in situ of the left breast     Plan:     I will now see her as needed. She will continue her self examinations and yearly mammograms

## 2012-10-07 ENCOUNTER — Encounter: Payer: Self-pay | Admitting: Vascular Surgery

## 2012-10-08 ENCOUNTER — Ambulatory Visit (INDEPENDENT_AMBULATORY_CARE_PROVIDER_SITE_OTHER): Payer: Medicare Other | Admitting: Vascular Surgery

## 2012-10-08 ENCOUNTER — Encounter (INDEPENDENT_AMBULATORY_CARE_PROVIDER_SITE_OTHER): Payer: Medicare Other | Admitting: Vascular Surgery

## 2012-10-08 ENCOUNTER — Encounter: Payer: Self-pay | Admitting: Vascular Surgery

## 2012-10-08 VITALS — BP 156/61 | HR 57 | Ht 65.0 in | Wt 125.8 lb

## 2012-10-08 DIAGNOSIS — I83893 Varicose veins of bilateral lower extremities with other complications: Secondary | ICD-10-CM | POA: Diagnosis not present

## 2012-10-08 DIAGNOSIS — I872 Venous insufficiency (chronic) (peripheral): Secondary | ICD-10-CM

## 2012-10-08 NOTE — Progress Notes (Signed)
[facility] [place]  Referred by:  Tresa Garter, MD [address] [facility] [address] [place], [place] [address]  Reason for referral: B varicose veins  History of Present Illness  Caroline Olson is a 72 y.o. (Sep 18, 1940) female who presents with chief complaint: worsening varicose veins.  Patient notes, onset of varicose veins in both legs years ago, associated with no known trigger.  The patient's symptoms include: B swelling in legs as day progresses.  The patient has had no history of DVT, known history of varicose vein, no history of venous stasis ulcers, previous pregnancies, no history of  Lymphedema and known history of skin changes in lower legs.  There is known family history of venous disorders.  The patient has used knee high compression stockings in the past.  Past Medical History  Diagnosis Date  . Breast cancer 2011    DCIF    Past Surgical History  Procedure Laterality Date  . Abdominal hysterectomy  1985  . Shoulder surgery  2003    tear  . Breast lumpectomy  2011    left  . Triceps tendon repair  2009  . Breast biopsy    . Appendectomy  1956    pt states appendix was on left side not rt. per surgeon  . Rotator cuff repair Right 2009    History   Social History  . Marital Status: Married    Spouse Name: N/A    Number of Children: N/A  . Years of Education: N/A   Occupational History  . Not on file.   Social History Main Topics  . Smoking status: Never Smoker   . Smokeless tobacco: Never Used  . Alcohol Use: 3.0 oz/week    5 Glasses of wine per week  . Drug Use: No  . Sexually Active: Not on file   Other Topics Concern  . Not on file   Social History Narrative  . No narrative on file    Family History  Problem Relation Age of Onset  . Cancer Mother   . Hypertension Mother   . Other Mother     varicose veins  . Heart disease Father   . Diabetes Father   . Colon cancer Neg Hx    Current Outpatient  Prescriptions on File Prior to Visit  Medication Sig Dispense Refill  . Ca Carbonate-Mag Hydroxide (ROLAIDS PO) Take by mouth daily.      . cholecalciferol (VITAMIN D) 1000 UNITS tablet Take 1,000 Units by mouth daily.      Marland Kitchen desonide (DESOWEN) 0.05 % cream Apply topically 2 (two) times daily.      . fexofenadine (ALLEGRA) 180 MG tablet Take 180 mg by mouth daily.      . Glucosamine Sulfate 500 MG TABS Take by mouth 2 (two) times daily.      Marland Kitchen ibuprofen (ADVIL,MOTRIN) 200 MG tablet Take 200 mg by mouth every 6 (six) hours as needed.        . metronidazole (NORITATE) 1 % cream Apply topically 2 (two) times daily.       . Multiple Vitamins-Minerals (MULTIVITAMIN WITH MINERALS) tablet Take 1 tablet by mouth 3 (three) times a week.       . Multiple Vitamins-Minerals (PRESERVISION AREDS PO) Take 2 capsules by mouth daily.      . Omega-3 Fatty Acids (FISH OIL CONCENTRATE PO) Take by mouth. 1.5 tsp daily      . oxybutynin (OXYTROL) 3.9 MG/24HR Place  1 patch onto the skin once a week.      . tretinoin (RETIN-A) 0.05 % cream Apply topically at bedtime.         No current facility-administered medications on file prior to visit.    Allergies  Allergen Reactions  . Aspirin Other (See Comments)    REACTION: ringing in ears  . Codeine Sulfate Nausea And Vomiting    REVIEW OF SYSTEMS:  (Positives checked otherwise negative)  CARDIOVASCULAR:  []  chest pain, []  chest pressure, []  palpitations, []  shortness of breath when laying flat, []  shortness of breath with exertion,  []  pain in feet when walking, []  pain in feet when laying flat, []  history of blood clot in veins (DVT), []  history of phlebitis, []  swelling in legs, [x]  varicose veins  PULMONARY:  []  productive cough, []  asthma, []  wheezing  NEUROLOGIC:  []  weakness in arms or legs, []  numbness in arms or legs, []  difficulty speaking or slurred speech, []  temporary loss of vision in one eye, []  dizziness  HEMATOLOGIC:  []  bleeding problems, []   problems with blood clotting too easily  MUSCULOSKEL:  []  joint pain, []  joint swelling  GASTROINTEST:  []  vomiting blood, []  blood in stool     GENITOURINARY:  []  burning with urination, []  blood in urine  PSYCHIATRIC:  []  history of major depression  INTEGUMENTARY:  []  rashes, []  ulcers  CONSTITUTIONAL:  []  fever, []  chills  For VQI Use Only  PRE-ADM LIVING: Home  AMB STATUS: Ambulatory  CAD Sx: None  PRIOR CHF: None  STRESS TEST: [x]  No, [ ]  Normal, [ ]  + ischemia, [ ]  + MI, [ ]  Both  Physical Examination Filed Vitals:   10/08/12 1445  BP: 156/61  Pulse: 57  Height: 5\' 5"  (1.651 m)  Weight: 125 lb 12.8 oz (57.063 kg)  SpO2: 100%   Body mass index is 20.93 kg/(m^2).  General: A&O x 3, WDWN  Head: [place]/AT  Ear/Nose/Throat: Hearing grossly intact, nares w/o erythema or drainage, oropharynx w/o Erythema/Exudate  Eyes: PERRLA, EOMI  Neck: Supple, no nuchal rigidity, no palpable LAD  Pulmonary: Sym exp, good air movt, CTAB, no rales, rhonchi, & wheezing  Cardiac: RRR, Nl S1, S2, no Murmurs, rubs or gallops  Vascular: Vessel Right Left  Radial Palpable Palpable  Brachial Palpable Palpable  Carotid Palpable, without bruit Palpable, without bruit  Aorta Not palpable N/A  Femoral Palpable Palpable  Popliteal Not palpable Not palpable  PT Palpable Palpable  DP Palpable Palpable   Gastrointestinal: soft, NTND, -G/R, - HSM, - masses, - CVAT B  Musculoskeletal: M/S 5/5 throughout , Extremities without ischemic changes , extensive varicosities in both legs, clustered veins in L medial thigh, mild LDS B  Neurologic: CN 2-12 intact , Pain and light touch intact in extremities , Motor exam as listed above  Psychiatric: Judgment intact, Mood & affect appropriate for pt's clinical situation  Dermatologic: See M/S exam for extremity exam, no rashes otherwise noted  Lymph : No Cervical, Axillary, or Inguinal lymphadenopathy   Non-Invasive Vascular Imaging  BLE  Venous Insufficiency Duplex (Date: 10/08/2012):   RLE: no DVT and SVT, + GSV reflux, no deep venous reflux, + perforator reflux   LLE: no DVT and SVT, + GSV reflux, deep venous reflux in FV, pop vein, and SFJ, + perforator reflux   Outside Studies/Documentation 4 pages of outside documents were reviewed including: outpatient PCP office note.  Medical Decision Making  Caroline Olson is a 72 y.o. female who  presents with: chronic venous insufficiency (C4).   Based on the patient's history and examination, I recommend: B thigh high compression stockings 20-30 mm Hg.  After a three month trial, I suspect she will be a candidate for a combination of EVLA and stab phlebectomy of GSV in BLE  Thank you for allowing [place] to participate in this patient's care.  Leonides Sake, MD [facility] [place] Office: [phone] Pager: [phone]  10/08/2012, 6:02 PM

## 2012-12-06 DIAGNOSIS — H26499 Other secondary cataract, unspecified eye: Secondary | ICD-10-CM | POA: Diagnosis not present

## 2012-12-08 DIAGNOSIS — M25469 Effusion, unspecified knee: Secondary | ICD-10-CM | POA: Diagnosis not present

## 2012-12-08 DIAGNOSIS — M25569 Pain in unspecified knee: Secondary | ICD-10-CM | POA: Diagnosis not present

## 2013-01-03 ENCOUNTER — Other Ambulatory Visit (HOSPITAL_BASED_OUTPATIENT_CLINIC_OR_DEPARTMENT_OTHER): Payer: Medicare Other | Admitting: Lab

## 2013-01-03 DIAGNOSIS — C50919 Malignant neoplasm of unspecified site of unspecified female breast: Secondary | ICD-10-CM

## 2013-01-03 LAB — CBC WITH DIFFERENTIAL/PLATELET
BASO%: 0.6 % (ref 0.0–2.0)
Eosinophils Absolute: 0.1 10*3/uL (ref 0.0–0.5)
MCHC: 34.4 g/dL (ref 31.5–36.0)
MONO#: 0.4 10*3/uL (ref 0.1–0.9)
NEUT#: 3.8 10*3/uL (ref 1.5–6.5)
RBC: 4 10*6/uL (ref 3.70–5.45)
RDW: 13.3 % (ref 11.2–14.5)
WBC: 5.6 10*3/uL (ref 3.9–10.3)
lymph#: 1.2 10*3/uL (ref 0.9–3.3)

## 2013-01-03 LAB — COMPREHENSIVE METABOLIC PANEL (CC13)
ALT: 24 U/L (ref 0–55)
Albumin: 3.6 g/dL (ref 3.5–5.0)
CO2: 28 mEq/L (ref 22–29)
Glucose: 95 mg/dl (ref 70–140)
Potassium: 4.2 mEq/L (ref 3.5–5.1)
Sodium: 138 mEq/L (ref 136–145)
Total Protein: 7.3 g/dL (ref 6.4–8.3)

## 2013-01-06 ENCOUNTER — Telehealth: Payer: Self-pay | Admitting: Nutrition

## 2013-01-06 NOTE — Telephone Encounter (Signed)
Patient requesting low fat diet print out.  I will copy and leave for her to pick up on Monday, Oct 13, when she is here for an appointment.

## 2013-01-10 ENCOUNTER — Ambulatory Visit (HOSPITAL_BASED_OUTPATIENT_CLINIC_OR_DEPARTMENT_OTHER): Payer: Medicare Other | Admitting: Oncology

## 2013-01-10 ENCOUNTER — Encounter: Payer: Self-pay | Admitting: Vascular Surgery

## 2013-01-10 VITALS — BP 173/75 | HR 64 | Temp 98.2°F | Resp 18 | Ht 65.0 in | Wt 129.4 lb

## 2013-01-10 DIAGNOSIS — C50412 Malignant neoplasm of upper-outer quadrant of left female breast: Secondary | ICD-10-CM | POA: Insufficient documentation

## 2013-01-10 DIAGNOSIS — Z853 Personal history of malignant neoplasm of breast: Secondary | ICD-10-CM | POA: Diagnosis not present

## 2013-01-10 NOTE — Progress Notes (Signed)
ID: Caroline Olson   DOB: Jul 09, 1940  MR#: [id number]  JXB#:[id number]  PCP: Sonda Primes, MD GYN: Earma Reading SU: Abigail Miyamoto OTHER MD: Gretta Began   HISTORY OF PRESENT ILLNESS: Caroline Olson herself palpated a mass in her left breast sometime in July of 2011.  She brought it to Dr. Rondel Baton attention and the patient had left mammography and ultrasonography October 04, 2009 at [facility].  Dr. Azucena Kuba was able to palpate a faint mass in the 12 o'clock position of the left breast which by mammography was near a stable rounded mass-like density containing layering calcifications.  The additional mass was mammographically visible as separate from that stable mass.  By ultrasound, the stable mass was a 1.9 cm cyst with depending calcification.  The other mass measuring 1.8 cm was oval, smoothly marginated, horizontally oriented and hypoechoic, with a small amount of course calcifications.    Dr. Azucena Kuba felt that repeat mammography in 5 months to assess stability would be reasonable and this was in fact performed February 25, 2010.  Again, the breast was dense but there was no suspicious mass or malignant type microcalcification identified in either breast by Dr. Judyann Munson.  Ultrasound showed a hypoechoic mass with microlobulated borders in the area in question, with increased through transmission.  It measured 1.7 cm here and had not changed in size, but she felt it was slightly suspicious on this ultrasound.  Of course, this is quite separate from the stable simple cyst nearby.  Accordingly, Dr. Judyann Munson proceeded to ultrasound-guided biopsy of the mass on November 28th and this showed ([id number]) an intraductal papilloma with atypical apocrine hyperplasia, ER positive at 14%, PR negative, HER-2/neu negative.  With this information, the patient was referred to Dr. Magnus Ivan and he recommended removal of this mass which was performed March 12, 2010.  However, this excisional biopsy showed ductal carcinoma in situ  with apocrine features involving an intraductal papilloma.  This spanned 1.5 cm and was described as low-grade.  The margins, however, were non-oriented and broadly involved.    Accordingly, the patient was brought back for a second procedure January 5th.  This ([id number]) showed only fibrocystic changes with calcifications.  No evidence of malignancy.    Caroline Olson underwent radiation therapy completed in mid March of 2012.  She decided against anti-estrogen therapy, the plan being to follow with observation alone.  INTERVAL HISTORY: Caroline Olson returns today for followup of her breast cancer. The interval history is unremarkable. She does all her housework, volunteers at church, and travels mostly to visit with family.  REVIEW OF SYSTEMS: Caroline Olson has not noted any changes of concern over either breast. A detailed review of systems today was otherwise entirely negative. She exercises regularly, chiefly by walking, but also does some weights at home.  PAST MEDICAL HISTORY: Past Medical History  Diagnosis Date  . Breast cancer 2011    DCIF   Significant for osteopenia, benign thyroid biopsy in 2007, history of benign left breast biopsy previously in 2001.  History of appendectomy, history of hysterectomy and bilateral salpingo-oophorectomy at age 10.  History of biceps tendon repair remotely.  History of left rotator cuff surgery.  History of early cataracts not yet requiring any intervention.  History of mild osteoarthritis symptoms.   PAST SURGICAL HISTORY: Past Surgical History  Procedure Laterality Date  . Abdominal hysterectomy  1985  . Shoulder surgery  2003    tear  . Breast lumpectomy  2011    left  . Triceps tendon repair  2009  . Breast biopsy    . Appendectomy  1956    pt states appendix was on left side not rt. per surgeon  . Rotator cuff repair Right 2009    FAMILY HISTORY Family History  Problem Relation Age of Onset  . Cancer Mother   . Hypertension Mother   . Other Mother      varicose veins  . Heart disease Father   . Diabetes Father   . Colon cancer Neg Hx   The patient's father died with congestive heart failure at the age of 62.  The patient's mother died with pancreatic cancer at the age of 62.  The patient's mother had four sisters; two of them had breast cancer but diagnosed at advanced ages:  One of them at the age of 70, for example.  The patient has one sister.  There is no breast or ovarian cancer in any first-degree relative.    GYN HISTORY:  She is GX P2, first pregnancy to term at age 22, menarche age 12.  The patient has been on estrogen replacement since her surgery at age 41.  (She continues on Vivelle Patches at this point.)    SOCIAL HISTORY:  She worked remotely as an Public house manager; later became a Sales executive.  Now mostly volunteers at church and at a local school.  Her husband, Caroline Olson, used to work for [employer].  He is now retired.  They have been married more than 50 years.  Daughter Caroline Olson, 22 years old, is a Comptroller and worked with Librarian, academic in [place] and just married recently.  Daughter Caroline Olson lives in [place].  She is unfortunately a widow, also a Sport and exercise psychologist, though currently a homemaker.  She has two daughters at home.  The patient attends the [employer].     ADVANCED DIRECTIVES:  HEALTH MAINTENANCE: History  Substance Use Topics  . Smoking status: Never Smoker   . Smokeless tobacco: Never Used  . Alcohol Use: 3.0 oz/week    5 Glasses of wine per week     Colonoscopy: approx 2003, Dr. Loreta Ave  PAP:  Bone density:   Lipid panel: UTD, Dr. Posey Rea   Allergies  Allergen Reactions  . Aspirin Other (See Comments)    REACTION: ringing in ears  . Codeine Sulfate Nausea And Vomiting    Current Outpatient Prescriptions  Medication Sig Dispense Refill  . Ca Carbonate-Mag Hydroxide (ROLAIDS PO) Take by mouth daily.      . Calcium Carbonate Antacid (ROLAIDS EXTRA STRENGTH PO) Take by mouth 2 (two) times daily.       . cholecalciferol (VITAMIN D) 1000 UNITS tablet Take 1,000 Units by mouth daily.      Marland Kitchen desonide (DESOWEN) 0.05 % cream Apply topically 2 (two) times daily.      . fexofenadine (ALLEGRA) 180 MG tablet Take 180 mg by mouth daily.      . Glucosamine Sulfate 500 MG TABS Take by mouth 2 (two) times daily.      Marland Kitchen ibuprofen (ADVIL,MOTRIN) 200 MG tablet Take 200 mg by mouth every 6 (six) hours as needed.        . metronidazole (NORITATE) 1 % cream Apply topically 2 (two) times daily.       . Multiple Vitamins-Minerals (MULTIVITAMIN WITH MINERALS) tablet Take 1 tablet by mouth 3 (three) times a week.       . Multiple Vitamins-Minerals (PRESERVISION AREDS PO) Take 2 capsules by mouth daily.      . Omega-3  Fatty Acids (FISH OIL CONCENTRATE PO) Take by mouth. 1.5 tsp daily      . oxybutynin (OXYTROL) 3.9 MG/24HR Place 1 patch onto the skin once a week.      . tretinoin (RETIN-A) 0.05 % cream Apply topically at bedtime.         No current facility-administered medications for this visit.    OBJECTIVE: Middle-aged white woman in no acute distress  Filed Vitals:   01/10/13 1315  BP: 173/75  Pulse: 64  Temp: 98.2 F (36.8 C)  Resp: 18     Body mass index is 21.53 kg/(m^2).    ECOG FS:  0 Filed Weights   01/10/13 1315  Weight: 129 lb 6.4 oz (58.695 kg)   Sclerae unicteric, pupils equal round and reactive Oropharynx no thrush or other lesions No cervical or supraclavicular adenopathy Lungs no rales or rhonchi Heart regular rate and rhythm Abd soft, nontender, with positive bowel sounds  MSK no focal spinal tenderness, no upper extremity lymphedema Neuro: nonfocal; well-oriented; pleasant affect  Breasts: The right breast is unremarkable; the left breast is status post lumpectomy and radiation. There is no evidence of local recurrence. The left axilla is benign  LAB RESULTS: Lab Results  Component Value Date   WBC 5.6 01/03/2013   NEUTROABS 3.8 01/03/2013   HGB 12.0 01/03/2013   HCT 34.9  01/03/2013   MCV 87.1 01/03/2013   PLT 253 01/03/2013      Chemistry      Component Value Date/Time   NA 138 01/03/2013 1328   NA 138 07/15/2012 0938   K 4.2 01/03/2013 1328   K 4.3 07/15/2012 0938   CL 101 07/15/2012 0938   CL 102 01/07/2012 1307   CO2 28 01/03/2013 1328   CO2 30 07/15/2012 0938   BUN 14.3 01/03/2013 1328   BUN 14 07/15/2012 0938   CREATININE 0.7 01/03/2013 1328   CREATININE 0.6 07/15/2012 0938      Component Value Date/Time   CALCIUM 9.8 01/03/2013 1328   CALCIUM 9.6 07/15/2012 0938   ALKPHOS 99 01/03/2013 1328   ALKPHOS 70 07/15/2012 0938   AST 21 01/03/2013 1328   AST 19 07/15/2012 0938   ALT 24 01/03/2013 1328   ALT 15 07/15/2012 0938   BILITOT 0.51 01/03/2013 1328   BILITOT 0.7 07/15/2012 0938       Lab Results  Component Value Date   LABCA2 16 01/07/2012     STUDIES: Mammography 02-2012 was unremarkable   ASSESSMENT: 72 y.o.  old [place], [place]   (1)  status post excisional biopsy in December of 2011 for 1.5 cm low-grade invasive ductal carcinoma with apocrine features.  ER positive at 14%, PR negative.  HER-2/neu negative.    (2)  Re-excision of margins in early January of 2012 showed no residual disease.    (3)  Status post radiation therapy completed in mid March of 2012.  Has decided against anti-estrogen therapy, and has been followed with observation alone  PLAN:  Mirabella is doing well from a breast cancer point of view. She is interested in "graduating" from followup here. Since we are not treating her, I am not uncomfortable with releasing her. She understands that she will need a yearly physician breast exam, which she usually gets through Dr. Hyacinth Meeker, and a yearly mammogram, which she usually gets in early December..  I also told Madge that we will keep parameters for several more years, so if she needs to come back here for  any reason in the future or if she simply wants to call for questions, we are available to her. As of now however we are  not making any further routine appointment for her here.  MAGRINAT,GUSTAV C    01/10/2013

## 2013-01-11 ENCOUNTER — Ambulatory Visit (INDEPENDENT_AMBULATORY_CARE_PROVIDER_SITE_OTHER): Payer: Medicare Other | Admitting: Vascular Surgery

## 2013-01-11 ENCOUNTER — Encounter: Payer: Self-pay | Admitting: Vascular Surgery

## 2013-01-11 VITALS — BP 164/67 | HR 70 | Resp 18 | Ht 65.0 in | Wt 129.8 lb

## 2013-01-11 DIAGNOSIS — I83893 Varicose veins of bilateral lower extremities with other complications: Secondary | ICD-10-CM | POA: Diagnosis not present

## 2013-01-11 NOTE — Progress Notes (Signed)
Problems with Activities of Daily Living Secondary to Leg Pain  1. Caroline Olson states that traveling is difficult due to leg pain.  2. Caroline Olson states that any activities that require prolonged sitting are difficult due to leg pain.       Failure of  Conservative Therapy:  1. Worn 20-30 mm Hg thigh high compression hose >3 months with no relief of symptoms.  2. Frequently elevates legs-no relief of symptoms  3. Taken Ibuprofen 600 Mg TID with no relief of symptoms.  The patient presents today for continued discussion of her bilateral venous hypertension. She has been extremely compliant with her thigh-high graduated compression garments. She does report continued fullness over the varicosities especially with prolonged standing and this is making it difficult for him he is active and she wishes to be.  I did review her venous duplex. This does show gross reflux in her saphenous vein bilaterally.  Feel that she has failed conservative treatment. I explained the option of bilateral staged laser ablation of her great saphenous vein and stab phlebectomy of her large tributary varicosities. I think this is under local anesthesia in our office. I explained very slight risk of DVT with the procedure. She wishes to proceed with stage treatment at her earliest convenience

## 2013-01-19 ENCOUNTER — Telehealth: Payer: Self-pay | Admitting: Vascular Surgery

## 2013-01-19 ENCOUNTER — Other Ambulatory Visit: Payer: Self-pay | Admitting: *Deleted

## 2013-01-19 DIAGNOSIS — I83893 Varicose veins of bilateral lower extremities with other complications: Secondary | ICD-10-CM

## 2013-01-19 NOTE — Telephone Encounter (Signed)
Message copied by Fredrich Birks on Wed Jan 19, 2013  1:35 PM ------      Message from: Domenic Moras D      Created: Wed Jan 19, 2013 12:57 PM      Regarding: SCHEDULING       Please schedule the following for [facility]:            1. Post laser ablation duplex (left leg, order in EPIC) and VV FU with Dr. Arbie Cookey on 02-01-2013 (anytime).             2. Post laser ablation duplex (right leg, order in EPIC) and VV FU with Dr. Hart Rochester ([facility] is out-of-town) on 02-15-2013 (afternoon appointments only).   ------

## 2013-01-20 DIAGNOSIS — H26499 Other secondary cataract, unspecified eye: Secondary | ICD-10-CM | POA: Diagnosis not present

## 2013-01-25 ENCOUNTER — Other Ambulatory Visit: Payer: Self-pay | Admitting: Oncology

## 2013-01-25 ENCOUNTER — Other Ambulatory Visit: Payer: Self-pay | Admitting: Obstetrics & Gynecology

## 2013-01-25 DIAGNOSIS — Z853 Personal history of malignant neoplasm of breast: Secondary | ICD-10-CM

## 2013-01-26 ENCOUNTER — Encounter: Payer: Self-pay | Admitting: Vascular Surgery

## 2013-01-27 ENCOUNTER — Ambulatory Visit (INDEPENDENT_AMBULATORY_CARE_PROVIDER_SITE_OTHER): Payer: Medicare Other | Admitting: Vascular Surgery

## 2013-01-27 ENCOUNTER — Encounter: Payer: Self-pay | Admitting: Vascular Surgery

## 2013-01-27 VITALS — BP 151/81 | HR 58 | Resp 18 | Ht 65.0 in | Wt 125.0 lb

## 2013-01-27 DIAGNOSIS — I83893 Varicose veins of bilateral lower extremities with other complications: Secondary | ICD-10-CM | POA: Diagnosis not present

## 2013-01-27 HISTORY — PX: ENDOVENOUS ABLATION SAPHENOUS VEIN W/ LASER: SUR449

## 2013-01-27 NOTE — Progress Notes (Signed)
Laser Ablation Procedure      Date: 01/27/2013    Caroline Olson DOB:January 07, 1941  Consent signed: Yes  Surgeon:T.F. Archer Moist  Procedure: Laser Ablation: left Greater Saphenous Vein  BP 151/81  Pulse 58  Resp 18  Ht 5\' 5"  (1.651 m)  Wt 125 lb (56.7 kg)  BMI 20.8 kg/m2  Start time: 8:45am   End time: 9:45am  Tumescent Anesthesia: 300 cc 0.9% NaCl with 50 cc Lidocaine HCL with 1% Epi and 15 cc 8.4% NaHCO3  Local Anesthesia: 2 cc Lidocaine HCL and NaHCO3 (ratio 2:1)  Continuous Mode: 15 Watts Total Energy 1539 Joules Total Time1:42   Sclerotherapy: 0.3 %Sotradecol. Patient received a total of 2 cc  Left leg   Patient tolerated procedure well: Yes    Description of Procedure:  After marking the course of the saphenous vein and the secondary varicosities in the standing position, the patient was placed on the operating table in the supine position, and the left leg was prepped and draped in sterile fashion. Local anesthetic was administered, and under ultrasound guidance the saphenous vein was accessed with a micro needle and guide wire; then the micro puncture sheath was placed. A guide wire was inserted to the saphenofemoral junction, followed by a 5 french sheath.  The position of the sheath and then the laser fiber below the junction was confirmed using the ultrasound and visualization of the aiming beam.  Tumescent anesthesia was administered along the course of the saphenous vein using ultrasound guidance. Protective laser glasses were placed on the patient, and the laser was fired at at 15 watt continuous mode.  For a total of 1539 joules.  A steri strip was applied to the puncture site.  Sclerotherapy was performed to 3 perforator vessels using 2  cc .3% Sotradecol foam via a 30 gauge needle.  ABD pads and thigh high compression stockings were applied.  Ace wrap bandages were applied over the phlebectomy sites and at the top of the saphenofemoral junction.  Blood loss was less than  15 cc.  The patient ambulated out of the operating room having tolerated the procedure well.

## 2013-01-31 ENCOUNTER — Encounter: Payer: Self-pay | Admitting: Vascular Surgery

## 2013-02-01 ENCOUNTER — Ambulatory Visit (HOSPITAL_COMMUNITY)
Admission: RE | Admit: 2013-02-01 | Discharge: 2013-02-01 | Disposition: A | Discharge status: Home / Self Care | Payer: Medicare Other | Source: Ambulatory Visit | Attending: Vascular Surgery | Admitting: Vascular Surgery

## 2013-02-01 ENCOUNTER — Encounter: Payer: Self-pay | Admitting: Vascular Surgery

## 2013-02-01 ENCOUNTER — Ambulatory Visit (INDEPENDENT_AMBULATORY_CARE_PROVIDER_SITE_OTHER): Payer: Medicare Other | Admitting: Vascular Surgery

## 2013-02-01 ENCOUNTER — Telehealth: Payer: Self-pay | Admitting: *Deleted

## 2013-02-01 VITALS — BP 174/71 | HR 60 | Resp 18 | Ht 65.0 in | Wt 127.7 lb

## 2013-02-01 DIAGNOSIS — I83893 Varicose veins of bilateral lower extremities with other complications: Secondary | ICD-10-CM

## 2013-02-01 NOTE — Telephone Encounter (Signed)
    02/01/2013  Time: 8:32 AM   Patient Name: Caroline Olson  Patient of: T.F. Early  Procedure:Laser Ablation left greater saphenous vein and sclerotherapy left leg 01-27-2013  Reached patient at home and checked  Her status  Yes    Comments/Actions Taken: Mrs. Slusher states she is having no problems with pain or swelling.  Reviewed post procedural instructions with her and reminded her of post laser ablation duplex and follow up appointment with Dr. Arbie Cookey on 02-01-2013.      @SIGNATURE @

## 2013-02-01 NOTE — Progress Notes (Signed)
The patient presents today for followup in one week after her ablation of left leg saphenous vein. She had a bifurcated system and the more anterior of her great saphenous vein branches was feeding a large plexus varicosities in her thigh. She has a minimal bruising and minimal discomfort associated with this and has been active. She does have some decompression of the varicosities. Also had sclerotherapy of area at the level of her ankle of the telangiectasia which had been bleeding and also several other areas that looked is of a recurrent bleed.  Physical exam mild bruising and thickening in the area the ablation.  Venous duplex today reveals closure of the treated saphenous vein from the plexus in her distal thigh up to just below the saphenofemoral junction and no evidence of DVT  Impression and plan successful ablation of left saphenous vein. She will be wearing her compression for additional week. She is scheduled to have a similar treatment with painful varicosities in her right leg and 2 weeks

## 2013-02-02 ENCOUNTER — Encounter: Payer: Self-pay | Admitting: Internal Medicine

## 2013-02-03 ENCOUNTER — Other Ambulatory Visit: Payer: Self-pay

## 2013-02-09 ENCOUNTER — Encounter: Payer: Self-pay | Admitting: Vascular Surgery

## 2013-02-10 ENCOUNTER — Encounter: Payer: Self-pay | Admitting: Vascular Surgery

## 2013-02-10 ENCOUNTER — Ambulatory Visit (INDEPENDENT_AMBULATORY_CARE_PROVIDER_SITE_OTHER): Payer: Medicare Other | Admitting: Vascular Surgery

## 2013-02-10 VITALS — BP 171/72 | HR 64 | Resp 16 | Ht 65.0 in | Wt 127.0 lb

## 2013-02-10 DIAGNOSIS — I83893 Varicose veins of bilateral lower extremities with other complications: Secondary | ICD-10-CM

## 2013-02-10 HISTORY — PX: ENDOVENOUS ABLATION SAPHENOUS VEIN W/ LASER: SUR449

## 2013-02-10 NOTE — Progress Notes (Signed)
   Laser Ablation Procedure      Date: 02/10/2013    Caroline Olson DOB:[birth year]  Consent signed: Yes  Surgeon:T.F. Ziquan Fidel  Procedure: Laser Ablation: right Greater Saphenous Vein  BP 171/72  Pulse 64  Resp 16  Ht 5\' 5"  (1.651 m)  Wt 127 lb (57.607 kg)  BMI 21.13 kg/m2  Start time: 10:45am   End time: 11:55am  Tumescent Anesthesia: 400 cc 0.9% NaCl with 50 cc Lidocaine HCL with 1% Epi and 15 cc 8.4% NaHCO3  Local Anesthesia: 4 cc Lidocaine HCL and NaHCO3 (ratio 2:1)  Continuous Mode: 15 Watts Total Energy 2393 Joules Total Time2:39   Sclerotherapy: 0.3 %Sotradecol. Patient received a total of 3 cc   Patient tolerated procedure well: Yes  Description of Procedure:  After marking the course of the saphenous vein and the secondary varicosities in the standing position, the patient was placed on the operating table in the supine position, and the right leg was prepped and draped in sterile fashion. Local anesthetic was administered, and under ultrasound guidance the saphenous vein was accessed with a micro needle and guide wire; then the micro puncture sheath was placed. A guide wire was inserted to the saphenofemoral junction, followed by a 5 french sheath.  The position of the sheath and then the laser fiber below the junction was confirmed using the ultrasound and visualization of the aiming beam.  Tumescent anesthesia was administered along the course of the saphenous vein using ultrasound guidance. Protective laser glasses were placed on the patient, and the laser was fired at at 15 watt continuous mode.  For a total of 2393 joules.  A steri strip was applied to the puncture site.   Sclerotherapy was performed to 3 perforator vessels using 0.3  cc .3% Sotradecol foam via a 27g butterfly needle.  ABD pads and thigh high compression stockings were applied.  Ace wrap bandages were applied over the phlebectomy sites and at the top of the saphenofemoral junction.  Blood loss was  less than 15 cc.  The patient ambulated out of the operating room having tolerated the procedure well.

## 2013-02-14 ENCOUNTER — Telehealth: Payer: Self-pay | Admitting: *Deleted

## 2013-02-14 ENCOUNTER — Encounter: Payer: Self-pay | Admitting: Vascular Surgery

## 2013-02-14 NOTE — Telephone Encounter (Signed)
    02/14/2013  Time: 12:34 PM   Patient Name: Caroline Olson  Patient of: T.F. Early  Procedure:Laser Ablation right greater saphenous vein and sclerotherapy right leg 02-10-2013  Reached patient at home and checked  Her status  Yes    Comments/Actions Taken: [name] states no problem with leg pain or swelling.  Reviewed the post procedural instructions with her and reminded her of post procedural duplex and follow up with Dr. Hart Rochester on 02-15-2013.      @SIGNATURE @

## 2013-02-15 ENCOUNTER — Ambulatory Visit (INDEPENDENT_AMBULATORY_CARE_PROVIDER_SITE_OTHER): Payer: Medicare Other | Admitting: Vascular Surgery

## 2013-02-15 ENCOUNTER — Ambulatory Visit (HOSPITAL_COMMUNITY)
Admission: RE | Admit: 2013-02-15 | Discharge: 2013-02-15 | Disposition: A | Discharge status: Home / Self Care | Payer: Medicare Other | Source: Ambulatory Visit | Attending: Vascular Surgery | Admitting: Vascular Surgery

## 2013-02-15 ENCOUNTER — Encounter: Payer: Self-pay | Admitting: Vascular Surgery

## 2013-02-15 VITALS — BP 176/59 | HR 65 | Resp 16 | Ht 65.0 in | Wt 127.0 lb

## 2013-02-15 DIAGNOSIS — I83893 Varicose veins of bilateral lower extremities with other complications: Secondary | ICD-10-CM | POA: Insufficient documentation

## 2013-02-15 NOTE — Progress Notes (Signed)
Subjective:     Patient ID: Caroline Olson, female   DOB: 04/10/1940, 72 y.o.   MRN: [id number]  HPI this 72 year old female is one week post laser ablation right great saphenous vein for venous hypertension with painful varicosities. She has had some mild discomfort in the right leg but less than she had in the contralateral left leg. She denies any unusual symptoms. She has been taking her ibuprofen as prescribed and wearing her long leg elastic compression stocking. She has had no distal edema.  Past Medical History  Diagnosis Date  . Breast cancer 2011    DCIF    History  Substance Use Topics  . Smoking status: Never Smoker   . Smokeless tobacco: Never Used  . Alcohol Use: 3.0 oz/week    5 Glasses of wine per week    Family History  Problem Relation Age of Onset  . Cancer Mother   . Hypertension Mother   . Other Mother     varicose veins  . Heart disease Father   . Diabetes Father   . Colon cancer Neg Hx     Allergies  Allergen Reactions  . Aspirin Other (See Comments)    REACTION: ringing in ears  . Codeine Sulfate Nausea And Vomiting    Current outpatient prescriptions:Ca Carbonate-Mag Hydroxide (ROLAIDS PO), Take by mouth daily., Disp: , Rfl: ;  Calcium Carbonate Antacid (ROLAIDS EXTRA STRENGTH PO), Take by mouth 2 (two) times daily., Disp: , Rfl: ;  cholecalciferol (VITAMIN D) 1000 UNITS tablet, Take 1,000 Units by mouth daily., Disp: , Rfl: ;  desonide (DESOWEN) 0.05 % cream, Apply topically as needed. , Disp: , Rfl:  fexofenadine (ALLEGRA) 180 MG tablet, Take 180 mg by mouth daily., Disp: , Rfl: ;  Glucosamine Sulfate 500 MG TABS, Take by mouth 2 (two) times daily., Disp: , Rfl: ;  ibuprofen (ADVIL,MOTRIN) 200 MG tablet, Take 200 mg by mouth every 6 (six) hours as needed.  , Disp: , Rfl: ;  metronidazole (NORITATE) 1 % cream, Apply topically 2 (two) times daily. , Disp: , Rfl:  Multiple Vitamins-Minerals (MULTIVITAMIN WITH MINERALS) tablet, Take 1 tablet by mouth 3  (three) times a week. , Disp: , Rfl: ;  Multiple Vitamins-Minerals (PRESERVISION AREDS PO), Take 2 capsules by mouth daily., Disp: , Rfl: ;  Omega-3 Fatty Acids (FISH OIL CONCENTRATE PO), Take by mouth. 1 tsp daily, Disp: , Rfl: ;  oxybutynin (OXYTROL) 3.9 MG/24HR, Place 1 patch onto the skin once a week., Disp: , Rfl:  tretinoin (RETIN-A) 0.05 % cream, Apply topically at bedtime.  , Disp: , Rfl:   BP 176/59  Pulse 65  Resp 16  Ht 5\' 5"  (1.651 m)  Wt 127 lb (57.607 kg)  BMI 21.13 kg/m2  Body mass index is 21.13 kg/(m^2).         Review of Systems denies chest pain, dyspnea on exertion, PND, orthopnea, hemoptysis.    Objective:   Physical Exam BP 176/59  Pulse 65  Resp 16  Ht 5\' 5"  (1.651 m)  Wt 127 lb (57.607 kg)  BMI 21.13 kg/m2  General well-developed well-nourished female no apparent stress alert and oriented x3 Lungs no rhonchi or wheezing Right leg with mild tenderness along the course of great saphenous vein from knee to the inguinal crease. 3+ dorsalis pedis pulse palpable. Minimal ecchymosis present.  Today I ordered a venous duplex exam of the right leg which are reviewed and interpreted. The great saphenous vein the right leg is closed  from the knee to near the saphenofemoral junction with no DVT      Assessment:     Successful laser ablation right great saphenous vein from knee to near saphenofemoral junction with no evidence DVT for painful varicosities    Plan:     Return in 3 months to determine if stab phlebectomy or sclerotherapy will be indicated for residual varicosities

## 2013-03-03 ENCOUNTER — Ambulatory Visit
Admission: RE | Admit: 2013-03-03 | Discharge: 2013-03-03 | Disposition: A | Discharge status: Home / Self Care | Payer: Medicare Other | Source: Ambulatory Visit | Attending: Oncology | Admitting: Oncology

## 2013-03-03 DIAGNOSIS — R928 Other abnormal and inconclusive findings on diagnostic imaging of breast: Secondary | ICD-10-CM | POA: Diagnosis not present

## 2013-03-03 DIAGNOSIS — Z853 Personal history of malignant neoplasm of breast: Secondary | ICD-10-CM

## 2013-03-08 DIAGNOSIS — H04129 Dry eye syndrome of unspecified lacrimal gland: Secondary | ICD-10-CM | POA: Diagnosis not present

## 2013-03-10 ENCOUNTER — Ambulatory Visit: Payer: Medicare Other | Admitting: Vascular Surgery

## 2013-03-10 ENCOUNTER — Encounter: Payer: Self-pay | Admitting: Vascular Surgery

## 2013-03-14 ENCOUNTER — Encounter: Payer: Self-pay | Admitting: Vascular Surgery

## 2013-03-15 ENCOUNTER — Ambulatory Visit (INDEPENDENT_AMBULATORY_CARE_PROVIDER_SITE_OTHER): Payer: Self-pay | Admitting: Vascular Surgery

## 2013-03-15 ENCOUNTER — Encounter: Payer: Self-pay | Admitting: Vascular Surgery

## 2013-03-15 VITALS — BP 153/60 | HR 64 | Ht 65.0 in | Wt 130.6 lb

## 2013-03-15 DIAGNOSIS — I83893 Varicose veins of bilateral lower extremities with other complications: Secondary | ICD-10-CM

## 2013-03-15 NOTE — Progress Notes (Signed)
The patient presents today for evaluation of irritation on the lateral aspect of her left ankle. She is status post staged bilateral laser ablation of her great saphenous vein. She had a history of bleeding from telangiectasia around her ankle and also underwent sclerotherapy of these areas. She had an incidence of inflammation over the lateral aspect of her left ankle. She reports this has improved but was painful time. She actually did unfortunately take a picture and does show some erythema around this area. This on physical exam appears to be related to clot in the telangiectasia with some thrombophlebitis around this area. She also has an area over her medial right ankle with similar clot in the telangiectasia. I reassured her that there is no incidence with a DVT associated with this. I explained that we'll run its course. She reports that his artery markedly better with minimal discomfort. She will continue ibuprofen for discomfort. She continues to have some discomfort over the tributary varicosities in her thighs bilaterally. We will see her as planned in 2-3 months for continued followup of this.

## 2013-03-16 ENCOUNTER — Encounter: Payer: Self-pay | Admitting: *Deleted

## 2013-03-29 DIAGNOSIS — H02059 Trichiasis without entropian unspecified eye, unspecified eyelid: Secondary | ICD-10-CM | POA: Diagnosis not present

## 2013-05-17 ENCOUNTER — Encounter: Payer: Self-pay | Admitting: Vascular Surgery

## 2013-05-17 ENCOUNTER — Ambulatory Visit (INDEPENDENT_AMBULATORY_CARE_PROVIDER_SITE_OTHER): Payer: Medicare Other | Admitting: Vascular Surgery

## 2013-05-17 VITALS — BP 173/74 | HR 67 | Resp 16 | Ht 66.0 in | Wt 132.6 lb

## 2013-05-17 DIAGNOSIS — I83893 Varicose veins of bilateral lower extremities with other complications: Secondary | ICD-10-CM | POA: Diagnosis not present

## 2013-05-17 NOTE — Progress Notes (Signed)
The patient has today for continued discussion regarding her venous hypertension. She is status post . bilateral ablations. The left leg was of a more anterior to saphenous branches. She does have some improvement. Her main symptoms continue to be a large plexus varicosities in the medial aspect of her left thigh. I reimaged these areas with SonoSite ultrasound. She does have closure of her right great saphenous vein. On the left sheath closure of the anterior branch. There is some distal flow in the more medial posterior branch of her saphenous vein below this plexus at the level of her knee.  Past Medical History  Diagnosis Date  . 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 Varicose veins     History  Substance Use Topics  . Smoking status: Never Smoker   . Smokeless tobacco: Never Used  . Alcohol Use: 3.0 oz/week    5 Glasses of wine per week    Family History  Problem Relation Age of Onset  . Cancer Mother   . Hypertension Mother   . Other Mother     varicose veins  . Heart disease Father   . Diabetes Father   . Colon cancer Neg Hx     Allergies  Allergen Reactions  . Aspirin Other (See Comments)    REACTION: ringing in ears  . Codeine Sulfate Nausea And Vomiting    Current outpatient prescriptions:Ca Carbonate-Mag Hydroxide (ROLAIDS PO), Take by mouth daily., Disp: , Rfl: ;  Calcium Carbonate Antacid (ROLAIDS EXTRA STRENGTH PO), Take by mouth 2 (two) times daily., Disp: , Rfl: ;  cholecalciferol (VITAMIN D) 1000 UNITS tablet, Take 1,000 Units by mouth daily., Disp: , Rfl: ;  desonide (DESOWEN) 0.05 % cream, Apply topically as needed. , Disp: , Rfl:  fexofenadine (ALLEGRA) 180 MG tablet, Take 180 mg by mouth daily., Disp: , Rfl: ;  Glucosamine Sulfate 500 MG TABS, Take by mouth 2 (two) times daily., Disp: , Rfl: ;  ibuprofen (ADVIL,MOTRIN) 200 MG tablet, Take 200 mg by mouth every 6 (six) hours as needed.  , Disp: , Rfl: ;  metronidazole (NORITATE) 1 % cream, Apply topically 2 (two) times  daily. , Disp: , Rfl:  Multiple Vitamins-Minerals (MULTIVITAMIN WITH MINERALS) tablet, Take 1 tablet by mouth 3 (three) times a week. , Disp: , Rfl: ;  Multiple Vitamins-Minerals (PRESERVISION AREDS PO), Take 2 capsules by mouth daily., Disp: , Rfl: ;  Omega-3 Fatty Acids (FISH OIL CONCENTRATE PO), Take by mouth. 1 tsp daily, Disp: , Rfl: ;  oxybutynin (OXYTROL) 3.9 MG/24HR, Place 1 patch onto the skin once a week., Disp: , Rfl:  tretinoin (RETIN-A) 0.05 % cream, Apply topically at bedtime.  , Disp: , Rfl:   BP 173/74  Pulse 67  Resp 16  Ht 5\' 6"  (1.676 m)  Wt 132 lb 9.6 oz (60.147 kg)  BMI 21.41 kg/m2  Body mass index is 21.41 kg/(m^2).       Impression and plan antral curvature varicosities medial aspect of her right thigh. I have recommended stab phlebectomy of these varicosities for symptom relief. She understands that this is under tumescent anesthesia in the off this. I did explain that she may require ablation of her other saphenous branch in her left leg in the future but would reserve this at this time. I felt comfortable we can correct her symptoms by phlebectomy of the painful tributary varicosities.

## 2013-06-01 ENCOUNTER — Other Ambulatory Visit: Payer: Self-pay

## 2013-06-01 DIAGNOSIS — B351 Tinea unguium: Secondary | ICD-10-CM | POA: Diagnosis not present

## 2013-06-01 DIAGNOSIS — D239 Other benign neoplasm of skin, unspecified: Secondary | ICD-10-CM | POA: Diagnosis not present

## 2013-06-01 DIAGNOSIS — L570 Actinic keratosis: Secondary | ICD-10-CM | POA: Diagnosis not present

## 2013-06-01 DIAGNOSIS — L821 Other seborrheic keratosis: Secondary | ICD-10-CM | POA: Diagnosis not present

## 2013-06-01 DIAGNOSIS — D1801 Hemangioma of skin and subcutaneous tissue: Secondary | ICD-10-CM | POA: Diagnosis not present

## 2013-06-01 DIAGNOSIS — D485 Neoplasm of uncertain behavior of skin: Secondary | ICD-10-CM | POA: Diagnosis not present

## 2013-06-01 DIAGNOSIS — L819 Disorder of pigmentation, unspecified: Secondary | ICD-10-CM | POA: Diagnosis not present

## 2013-06-01 DIAGNOSIS — L739 Follicular disorder, unspecified: Secondary | ICD-10-CM | POA: Diagnosis not present

## 2013-06-30 ENCOUNTER — Encounter: Payer: Self-pay | Admitting: Obstetrics & Gynecology

## 2013-06-30 ENCOUNTER — Ambulatory Visit (INDEPENDENT_AMBULATORY_CARE_PROVIDER_SITE_OTHER): Payer: Medicare Other | Admitting: Obstetrics & Gynecology

## 2013-06-30 VITALS — BP 158/80 | HR 58 | Resp 16 | Ht 64.75 in | Wt 129.6 lb

## 2013-06-30 DIAGNOSIS — Z23 Encounter for immunization: Secondary | ICD-10-CM

## 2013-06-30 DIAGNOSIS — Z01419 Encounter for gynecological examination (general) (routine) without abnormal findings: Secondary | ICD-10-CM

## 2013-06-30 DIAGNOSIS — N393 Stress incontinence (female) (male): Secondary | ICD-10-CM

## 2013-06-30 DIAGNOSIS — Z124 Encounter for screening for malignant neoplasm of cervix: Secondary | ICD-10-CM

## 2013-06-30 NOTE — Progress Notes (Signed)
73 y.o. G2P2 MarriedCaucasianF here for annual exam.  Family coming for Easter egg hunt on Saturday.  This is a family tradition.  Has about 30 coming.  No VB.    Has been released from the [facility] by Dr. Jana Hakim.  Seeing me every year and Dr. Alain Marion yearly.  We alternate these appts every six months.    Patient's last menstrual period was 04/01/1983.          Sexually active: yes  The current method of family planning is status post hysterectomy.    Exercising: yes  walking and work out Smoker:  no  Health Maintenance: Pap:  03/10/11-WNL History of abnormal Pap:  no MMG:  03/04/11-diag in one year, h/o breast cancer  Colonoscopy:  2015 BMD:   11/10-osteopenia TDaP:  05/30/03 Screening Labs: PCP, Hb today: PCP, Urine today: PCP   reports that she has never smoked. She has never used smokeless tobacco. She reports that she drinks about 3.0 ounces of alcohol per week. She reports that she does not use illicit drugs.  Past Medical History  Diagnosis Date  . 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 Varicose veins     Past Surgical History  Procedure Laterality Date  . Abdominal hysterectomy  1985  . Shoulder surgery  2003    tear  . Breast lumpectomy  2011    left  . Triceps tendon repair  2009  . Breast biopsy    . Appendectomy  1956    pt states appendix was on left side not rt. per surgeon  . Rotator cuff repair Right 2009  . Endovenous ablation saphenous vein w/ laser Left 01-27-2013    left greater saphenous vein and sclerotherapy left leg by Curt Jews MD  . Endovenous ablation saphenous vein w/ laser Right 02-10-2013    right greater saphenous vein and sclerotherapy right leg by Curt Jews MD    Current Outpatient Prescriptions  Medication Sig Dispense Refill  . Calcium Carbonate Antacid (ROLAIDS EXTRA STRENGTH PO) Take by mouth 2 (two) times daily.      . cholecalciferol (VITAMIN D) 1000 UNITS tablet Take 1,000 Units by mouth daily.      Marland Kitchen desonide (DESOWEN) 0.05 %  cream Apply topically as needed.       . fexofenadine (ALLEGRA) 180 MG tablet Take 180 mg by mouth daily.      . Glucosamine Sulfate 500 MG TABS Take by mouth 2 (two) times daily.      Marland Kitchen ibuprofen (ADVIL,MOTRIN) 200 MG tablet Take 200 mg by mouth every 6 (six) hours as needed.        . metronidazole (NORITATE) 1 % cream Apply topically 2 (two) times daily.       . Multiple Vitamins-Minerals (MULTIVITAMIN WITH MINERALS) tablet Take 1 tablet by mouth 3 (three) times a week.       . Multiple Vitamins-Minerals (PRESERVISION AREDS PO) Take 2 capsules by mouth daily.      . Omega-3 Fatty Acids (FISH OIL CONCENTRATE PO) Take by mouth. 1 tsp daily      . oxybutynin (OXYTROL) 3.9 MG/24HR Place 1 patch onto the skin once a week.      . tretinoin (RETIN-A) 0.05 % cream Apply topically at bedtime.         No current facility-administered medications for this visit.    Family History  Problem Relation Age of Onset  . Cancer Mother   . Hypertension Mother   . Other Mother  varicose veins  . Heart disease Father   . Diabetes Father   . Colon cancer Neg Hx     ROS:  Pertinent items are noted in HPI.  Otherwise, a comprehensive ROS was negative.  Exam:   BP 158/80  Pulse 58  Resp 16  Ht 5' 4.75" (1.645 m)  Wt 129 lb 9.6 oz (58.786 kg)  BMI 21.72 kg/m2  LMP 04/01/1983  Weight change: @WEIGHTCHANGE @ Height:   Height: 5' 4.75" (164.5 cm)  Ht Readings from Last 3 Encounters:  06/30/13 5' 4.75" (1.645 m)  05/17/13 5\' 6"  (1.676 m)  03/15/13 5\' 5"  (1.651 m)    General appearance: alert, cooperative and appears stated age Head: Normocephalic, without obvious abnormality, atraumatic Neck: no adenopathy, supple, symmetrical, trachea midline and thyroid normal to inspection and palpation Lungs: clear to auscultation bilaterally Breasts: normal appearance, no masses or tenderness Heart: regular rate and rhythm Abdomen: soft, non-tender; bowel sounds normal; no masses,  no  organomegaly Extremities: extremities normal, atraumatic, no cyanosis or edema Skin: Skin color, texture, turgor normal. No rashes or lesions Lymph nodes: Cervical, supraclavicular, and axillary nodes normal. No abnormal inguinal nodes palpated Neurologic: Grossly normal   Pelvic: External genitalia:  no lesions              Urethra:  normal appearing urethra with no masses, tenderness or lesions              Bartholins and Skenes: normal                 Vagina: normal appearing vagina with normal color and discharge, no lesions              Cervix: absent              Pap taken: no Bimanual Exam:  Uterus:  uterus absent              Adnexa: no mass, fullness, tenderness               Rectovaginal: Confirms               Anus:  normal sphincter tone, no lesions  A:  Well Woman with normal exam H/O Breast cancer 12/11 s/p lumpectomy and radiation Osteopenia SUI.  Was given oxytrol last yr with Dr. Charlies Constable without improvement  P:   Mammogram due 12/15.  Will schedule with BMD for December pap smear not indicated.  No pap obtained. Tdap today Referral to [facility]  return annually or prn  An After Visit Summary was printed and given to the patient.

## 2013-06-30 NOTE — Patient Instructions (Signed)

## 2013-07-12 ENCOUNTER — Other Ambulatory Visit: Payer: Self-pay | Admitting: Obstetrics & Gynecology

## 2013-07-12 DIAGNOSIS — Z853 Personal history of malignant neoplasm of breast: Secondary | ICD-10-CM

## 2013-07-13 ENCOUNTER — Telehealth: Payer: Self-pay | Admitting: Obstetrics & Gynecology

## 2013-07-13 NOTE — Telephone Encounter (Signed)
Patient is waiting to hear about referral. Patient says she was told to call if she had not heard from referring office in 1 week.

## 2013-07-13 NOTE — Telephone Encounter (Signed)
Spoke with patient. Caroline Olson contacted [facility] yesterday and is waiting for return phone call for appointment. Advised once appointment is made [facility] will be in contact with patient about date and time. Patient agreeable and verbalizes understanding.  Routing to provider for final review. Patient agreeable to disposition. Will close encounter

## 2013-07-14 ENCOUNTER — Encounter: Payer: Medicare Other | Admitting: Internal Medicine

## 2013-07-14 NOTE — Telephone Encounter (Signed)
Has this referral been made?  Please follow up with [facility] as has been two weeks.  CC:  [facility]

## 2013-07-15 NOTE — Telephone Encounter (Signed)
Spoke with patient. Patient advised of appointment scheduled with [facility] for 4-27 at 11 am. Patient states that she can not make this appointment as she will be out of town that day. Patient requesting new appointment day and time. Advised would call [facility] back with days and times that will work well for patient and give her a call back.

## 2013-07-15 NOTE — Telephone Encounter (Signed)
Spoke with patient. Patient requesting appointment for May 4th after 2. May 6th in the morning or any time May 18th. Advised patient appointment rescheduled with [facility] for May 4th at 3pm. Patient agreeable to day and time.   Routing to provider for final review. Patient agreeable to disposition. Will close encounter.

## 2013-07-19 ENCOUNTER — Other Ambulatory Visit: Payer: Self-pay | Admitting: Obstetrics & Gynecology

## 2013-07-19 ENCOUNTER — Telehealth: Payer: Self-pay

## 2013-07-19 DIAGNOSIS — M8580 Other specified disorders of bone density and structure, unspecified site: Secondary | ICD-10-CM

## 2013-07-19 NOTE — Telephone Encounter (Signed)
lmtcb to inform date/time of mmg/bmd//kn

## 2013-07-20 ENCOUNTER — Encounter: Payer: Self-pay | Admitting: Vascular Surgery

## 2013-07-21 ENCOUNTER — Ambulatory Visit (INDEPENDENT_AMBULATORY_CARE_PROVIDER_SITE_OTHER): Payer: Medicare Other | Admitting: Vascular Surgery

## 2013-07-21 ENCOUNTER — Encounter: Payer: Self-pay | Admitting: Vascular Surgery

## 2013-07-21 VITALS — BP 145/76 | HR 60 | Resp 16 | Ht 65.5 in | Wt 126.0 lb

## 2013-07-21 DIAGNOSIS — I83893 Varicose veins of bilateral lower extremities with other complications: Secondary | ICD-10-CM

## 2013-07-21 HISTORY — PX: OTHER SURGICAL HISTORY: SHX169

## 2013-07-21 NOTE — Progress Notes (Signed)
   Stab Phlebectomy Procedure      Date: 07/21/2013    Caroline Olson DOB:Aug 04, 1940  Consent signed: Yes  Surgeon:T.F. Diamond Martucci     BP 145/76  Pulse 60  Resp 16  Ht 5' 5.5" (1.664 m)  Wt 126 lb (57.153 kg)  BMI 20.64 kg/m2  LMP 04/01/1983  Start time: 0830   End time: 0930  Tumescent Anesthesia: 225 cc 0.9% NaCl with 50 cc Lidocaine HCL with 1% Epi and 15 cc 8.4% NaHCO3  Local Anesthesia: 1.5 cc Lidocaine HCL and NaHCO3 (ratio 2:1)      Stab Phlebectomy: 10-20 Sites: Thigh and Calf  Left leg  Patient tolerated procedure well: Yes    Description of Procedure:  After marking the course of the saphenous vein and the secondary varicosities in the standing position, the patient was placed on the operating table in the supine position, and the left leg was prepped and draped in sterile fashion. Local anesthetic was administered. The patient was then put into Trendelenburg position.  Local anesthetic was utilized overlying the marked varicosities. 10-20 stab wounds were made using the tip of an 11 blade; and using the vein hook. The phlebectomies were performed using a hemostat to avulse these varicosities.  Adequate hemostasis was achieved, and steri strips were applied to the stab wound.      ABD pads and thigh high compression stockings were applied.  Ace wrap bandages were applied over the phlebectomy sites.  Blood loss was less than 15 cc.  The patient ambulated out of the operating room having tolerated the procedure well.

## 2013-07-22 ENCOUNTER — Telehealth: Payer: Self-pay | Admitting: *Deleted

## 2013-07-22 NOTE — Telephone Encounter (Signed)
    07/22/2013  Time: 10:41 AM   Patient Name: Caroline Olson  Patient of: T.F. Early  Procedure: Stab Phlebectomy 10-20 incisions left leg   Left voice message on Mrs. Pett's home phone and cell phone.  Asked that she call if she had questions or concerns.  Reminded her of follow up office visit with Dr. Donnetta Hutching on 08-04-2013.      @SIGNATURE @

## 2013-07-22 NOTE — Telephone Encounter (Signed)
Returning Caroline Olson's earlier telephone message regarding left leg swelling.  [name] states she

## 2013-07-22 NOTE — Telephone Encounter (Signed)
Returning Caroline Olson's earlier telephone message regarding left leg swelling.  Caroline Olson states that she worked this morning at [employer] and was on her feet for several hours.  She reports left leg (knee and foot) swelling but denies any left leg pain.  States she is able to wear her shoes.   Encouraged her to elevate left leg, continue wearing compression dressing, and to use ice compresses as needed.  Reassured her that DVT is not a complication of stab phlebectomy procedure.  Encouraged her to call back if she has further questions or concerns.  Caroline Olson verbalized understanding and appears reassured.

## 2013-07-26 ENCOUNTER — Encounter: Payer: Self-pay | Admitting: Vascular Surgery

## 2013-08-01 DIAGNOSIS — M6281 Muscle weakness (generalized): Secondary | ICD-10-CM | POA: Diagnosis not present

## 2013-08-01 DIAGNOSIS — M62838 Other muscle spasm: Secondary | ICD-10-CM | POA: Diagnosis not present

## 2013-08-01 DIAGNOSIS — N3946 Mixed incontinence: Secondary | ICD-10-CM | POA: Diagnosis not present

## 2013-08-01 DIAGNOSIS — R32 Unspecified urinary incontinence: Secondary | ICD-10-CM | POA: Diagnosis not present

## 2013-08-03 ENCOUNTER — Encounter: Payer: Self-pay | Admitting: Vascular Surgery

## 2013-08-04 ENCOUNTER — Encounter: Payer: Self-pay | Admitting: Vascular Surgery

## 2013-08-04 ENCOUNTER — Encounter: Payer: Medicare Other | Admitting: Vascular Surgery

## 2013-08-04 ENCOUNTER — Ambulatory Visit (INDEPENDENT_AMBULATORY_CARE_PROVIDER_SITE_OTHER): Payer: Self-pay | Admitting: Vascular Surgery

## 2013-08-04 VITALS — BP 174/74 | HR 62 | Resp 16 | Ht 65.5 in | Wt 127.0 lb

## 2013-08-04 DIAGNOSIS — M6281 Muscle weakness (generalized): Secondary | ICD-10-CM | POA: Diagnosis not present

## 2013-08-04 DIAGNOSIS — M62838 Other muscle spasm: Secondary | ICD-10-CM | POA: Diagnosis not present

## 2013-08-04 DIAGNOSIS — N3946 Mixed incontinence: Secondary | ICD-10-CM | POA: Diagnosis not present

## 2013-08-04 DIAGNOSIS — R279 Unspecified lack of coordination: Secondary | ICD-10-CM | POA: Diagnosis not present

## 2013-08-04 DIAGNOSIS — I83893 Varicose veins of bilateral lower extremities with other complications: Secondary | ICD-10-CM

## 2013-08-04 NOTE — Progress Notes (Signed)
Here today for followup of stab phlebectomy of tributary varicosities 2 she had gone ablation prior to this. She had some over pretibial area large nest of varicosities in her medial thigh  On physical exam she does have a good result with the typical thickening in the thigh site where she had large varices. There is some blood in the skin and this will resolve. Incisions are healing quite nicely.  At impression and plan. Stable overall. Use compression garments on when necessary basis. We'll see [place] as needed

## 2013-08-16 DIAGNOSIS — M62838 Other muscle spasm: Secondary | ICD-10-CM | POA: Diagnosis not present

## 2013-08-16 DIAGNOSIS — N3946 Mixed incontinence: Secondary | ICD-10-CM | POA: Diagnosis not present

## 2013-08-16 DIAGNOSIS — R279 Unspecified lack of coordination: Secondary | ICD-10-CM | POA: Diagnosis not present

## 2013-08-16 DIAGNOSIS — M6281 Muscle weakness (generalized): Secondary | ICD-10-CM | POA: Diagnosis not present

## 2013-09-07 DIAGNOSIS — M62838 Other muscle spasm: Secondary | ICD-10-CM | POA: Diagnosis not present

## 2013-09-07 DIAGNOSIS — M6281 Muscle weakness (generalized): Secondary | ICD-10-CM | POA: Diagnosis not present

## 2013-09-07 DIAGNOSIS — R279 Unspecified lack of coordination: Secondary | ICD-10-CM | POA: Diagnosis not present

## 2013-09-07 DIAGNOSIS — N3946 Mixed incontinence: Secondary | ICD-10-CM | POA: Diagnosis not present

## 2013-09-15 DIAGNOSIS — N3946 Mixed incontinence: Secondary | ICD-10-CM | POA: Diagnosis not present

## 2013-09-15 DIAGNOSIS — R279 Unspecified lack of coordination: Secondary | ICD-10-CM | POA: Diagnosis not present

## 2013-09-15 DIAGNOSIS — M62838 Other muscle spasm: Secondary | ICD-10-CM | POA: Diagnosis not present

## 2013-09-15 DIAGNOSIS — M6281 Muscle weakness (generalized): Secondary | ICD-10-CM | POA: Diagnosis not present

## 2013-09-28 DIAGNOSIS — R279 Unspecified lack of coordination: Secondary | ICD-10-CM | POA: Diagnosis not present

## 2013-09-28 DIAGNOSIS — N3946 Mixed incontinence: Secondary | ICD-10-CM | POA: Diagnosis not present

## 2013-09-28 DIAGNOSIS — M62838 Other muscle spasm: Secondary | ICD-10-CM | POA: Diagnosis not present

## 2013-09-28 DIAGNOSIS — M6281 Muscle weakness (generalized): Secondary | ICD-10-CM | POA: Diagnosis not present

## 2013-10-07 DIAGNOSIS — M6281 Muscle weakness (generalized): Secondary | ICD-10-CM | POA: Diagnosis not present

## 2013-10-07 DIAGNOSIS — M62838 Other muscle spasm: Secondary | ICD-10-CM | POA: Diagnosis not present

## 2013-10-07 DIAGNOSIS — N3946 Mixed incontinence: Secondary | ICD-10-CM | POA: Diagnosis not present

## 2013-10-07 DIAGNOSIS — R279 Unspecified lack of coordination: Secondary | ICD-10-CM | POA: Diagnosis not present

## 2013-10-20 DIAGNOSIS — N3946 Mixed incontinence: Secondary | ICD-10-CM | POA: Diagnosis not present

## 2013-10-20 DIAGNOSIS — M6281 Muscle weakness (generalized): Secondary | ICD-10-CM | POA: Diagnosis not present

## 2013-10-20 DIAGNOSIS — M62838 Other muscle spasm: Secondary | ICD-10-CM | POA: Diagnosis not present

## 2013-10-20 DIAGNOSIS — R279 Unspecified lack of coordination: Secondary | ICD-10-CM | POA: Diagnosis not present

## 2013-11-02 DIAGNOSIS — M6281 Muscle weakness (generalized): Secondary | ICD-10-CM | POA: Diagnosis not present

## 2013-11-02 DIAGNOSIS — R279 Unspecified lack of coordination: Secondary | ICD-10-CM | POA: Diagnosis not present

## 2013-11-02 DIAGNOSIS — N3946 Mixed incontinence: Secondary | ICD-10-CM | POA: Diagnosis not present

## 2013-11-02 DIAGNOSIS — M62838 Other muscle spasm: Secondary | ICD-10-CM | POA: Diagnosis not present

## 2014-01-18 ENCOUNTER — Encounter: Payer: Medicare Other | Admitting: Internal Medicine

## 2014-01-25 ENCOUNTER — Encounter: Payer: Self-pay | Admitting: Internal Medicine

## 2014-01-25 ENCOUNTER — Ambulatory Visit (INDEPENDENT_AMBULATORY_CARE_PROVIDER_SITE_OTHER): Payer: Medicare Other | Admitting: Internal Medicine

## 2014-01-25 ENCOUNTER — Other Ambulatory Visit (INDEPENDENT_AMBULATORY_CARE_PROVIDER_SITE_OTHER): Payer: Medicare Other

## 2014-01-25 VITALS — BP 158/86 | HR 60 | Temp 98.5°F | Ht 65.0 in | Wt 127.0 lb

## 2014-01-25 DIAGNOSIS — Z0000 Encounter for general adult medical examination without abnormal findings: Secondary | ICD-10-CM | POA: Diagnosis not present

## 2014-01-25 DIAGNOSIS — E785 Hyperlipidemia, unspecified: Secondary | ICD-10-CM | POA: Diagnosis not present

## 2014-01-25 DIAGNOSIS — Z23 Encounter for immunization: Secondary | ICD-10-CM

## 2014-01-25 DIAGNOSIS — C50912 Malignant neoplasm of unspecified site of left female breast: Secondary | ICD-10-CM

## 2014-01-25 DIAGNOSIS — E042 Nontoxic multinodular goiter: Secondary | ICD-10-CM

## 2014-01-25 LAB — BASIC METABOLIC PANEL
BUN: 14 mg/dL (ref 6–23)
CO2: 28 mEq/L (ref 19–32)
CREATININE: 0.6 mg/dL (ref 0.4–1.2)
Calcium: 9.8 mg/dL (ref 8.4–10.5)
Chloride: 102 mEq/L (ref 96–112)
GFR: 107.67 mL/min (ref >60.00)
GLUCOSE: 97 mg/dL (ref 70–99)
POTASSIUM: 4.1 meq/L (ref 3.5–5.1)
Sodium: 137 mEq/L (ref 135–145)

## 2014-01-25 LAB — HEPATIC FUNCTION PANEL
ALT: 15 U/L (ref 0–35)
AST: 20 U/L (ref 0–37)
Albumin: 3.8 g/dL (ref 3.5–5.2)
Alkaline Phosphatase: 81 U/L (ref 39–117)
BILIRUBIN DIRECT: 0 mg/dL (ref 0.0–0.3)
BILIRUBIN TOTAL: 0.9 mg/dL (ref 0.2–1.2)
Total Protein: 7.6 g/dL (ref 6.0–8.3)

## 2014-01-25 LAB — LIPID PANEL
CHOL/HDL RATIO: 2
Cholesterol: 220 mg/dL — ABNORMAL HIGH (ref 0–200)
HDL: 94 mg/dL (ref >39.00)
LDL CALC: 117 mg/dL — AB (ref 0–99)
NonHDL: 126
Triglycerides: 44 mg/dL (ref 0.0–149.0)
VLDL: 8.8 mg/dL (ref 0.0–40.0)

## 2014-01-25 LAB — CBC WITH DIFFERENTIAL/PLATELET
Basophils Absolute: 0 10*3/uL (ref 0.0–0.1)
Basophils Relative: 0.5 % (ref 0.0–3.0)
EOS PCT: 1.9 % (ref 0.0–5.0)
Eosinophils Absolute: 0.1 10*3/uL (ref 0.0–0.7)
HEMATOCRIT: 38.7 % (ref 36.0–46.0)
Hemoglobin: 12.8 g/dL (ref 12.0–15.0)
Lymphocytes Relative: 21.6 % (ref 12.0–46.0)
Lymphs Abs: 1.2 10*3/uL (ref 0.7–4.0)
MCHC: 33.1 g/dL (ref 30.0–36.0)
MCV: 89.7 fl (ref 78.0–100.0)
MONO ABS: 0.4 10*3/uL (ref 0.1–1.0)
MONOS PCT: 7.2 % (ref 3.0–12.0)
NEUTROS PCT: 68.8 % (ref 43.0–77.0)
Neutro Abs: 3.8 10*3/uL (ref 1.4–7.7)
PLATELETS: 272 10*3/uL (ref 150.0–400.0)
RBC: 4.32 Mil/uL (ref 3.87–5.11)
RDW: 13.3 % (ref 11.5–15.5)
WBC: 5.5 10*3/uL (ref 4.0–10.5)

## 2014-01-25 LAB — URINALYSIS
BILIRUBIN URINE: NEGATIVE
HGB URINE DIPSTICK: NEGATIVE
KETONES UR: NEGATIVE
Leukocytes, UA: NEGATIVE
NITRITE: NEGATIVE
Specific Gravity, Urine: 1.015 (ref 1.000–1.030)
URINE GLUCOSE: NEGATIVE
Urobilinogen, UA: 1 (ref 0.0–1.0)
pH: 8 (ref 5.0–8.0)

## 2014-01-25 LAB — TSH: TSH: 0.74 u[IU]/mL (ref 0.35–4.50)

## 2014-01-25 NOTE — Assessment & Plan Note (Signed)
L breast in situ 12/11 Dr Jana Hakim; s/p resection and XRT

## 2014-01-25 NOTE — Assessment & Plan Note (Signed)

## 2014-01-25 NOTE — Progress Notes (Signed)
   Subjective:    HPI  The patient is here for a wellness exam. The patient has been doing well overall without major physical or psychological issues going on lately except for L br Ca in situ 2012   C/o hiccups while eating - mild when eating x 2-3 mo C/o some memory issues - mild  Wt Readings from Last 3 Encounters:  01/25/14 127 lb (57.607 kg)  08/04/13 127 lb (57.607 kg)  07/21/13 126 lb (57.153 kg)   BP Readings from Last 3 Encounters:  01/25/14 158/86  08/04/13 174/74  07/21/13 145/76      Review of Systems  Constitutional: Negative for fever, chills, diaphoresis, activity change, appetite change, fatigue and unexpected weight change.  HENT: Negative for congestion, dental problem, ear pain, hearing loss, mouth sores, postnasal drip, sinus pressure, sneezing, sore throat and voice change.   Eyes: Negative for pain and visual disturbance.  Respiratory: Negative for cough, chest tightness, wheezing and stridor.   Cardiovascular: Negative for chest pain, palpitations and leg swelling.  Gastrointestinal: Negative for nausea, vomiting, abdominal pain, blood in stool, abdominal distention and rectal pain.  Genitourinary: Negative for dysuria, hematuria, decreased urine volume, vaginal bleeding, vaginal discharge, difficulty urinating, vaginal pain and menstrual problem.  Musculoskeletal: Negative for back pain, gait problem, joint swelling and neck pain.  Skin: Negative for color change, rash and wound.  Neurological: Negative for dizziness, tremors, syncope, speech difficulty and light-headedness.  Hematological: Negative for adenopathy.  Psychiatric/Behavioral: Negative for suicidal ideas, hallucinations, behavioral problems, confusion, sleep disturbance, dysphoric mood and decreased concentration. The patient is not hyperactive.        Objective:   Physical Exam  Constitutional: She appears well-developed. No distress.  HENT:  Head: Normocephalic.  Right Ear: External  ear normal.  Left Ear: External ear normal.  Nose: Nose normal.  Mouth/Throat: Oropharynx is clear and moist.  Eyes: Conjunctivae are normal. Pupils are equal, round, and reactive to light. Right eye exhibits no discharge. Left eye exhibits no discharge.  Neck: Normal range of motion. Neck supple. No JVD present. No tracheal deviation present. No thyromegaly present.  Cardiovascular: Normal rate, regular rhythm and normal heart sounds.   Pulmonary/Chest: No stridor. No respiratory distress. She has no wheezes.  Abdominal: Soft. Bowel sounds are normal. She exhibits no distension and no mass. There is no tenderness. There is no rebound and no guarding.  Musculoskeletal: She exhibits no edema and no tenderness.  Lymphadenopathy:    She has no cervical adenopathy.  Neurological: She displays normal reflexes. No cranial nerve deficit. She exhibits normal muscle tone. Coordination normal.  Skin: No rash noted. No erythema.  Psychiatric: She has a normal mood and affect. Her behavior is normal. Judgment and thought content normal.   Lab Results  Component Value Date   WBC 5.6 01/03/2013   HGB 12.0 01/03/2013   HCT 34.9 01/03/2013   PLT 253 01/03/2013   GLUCOSE 95 01/03/2013   CHOL 199 07/15/2012   TRIG 37.0 07/15/2012   HDL 74.20 07/15/2012   LDLDIRECT 104.3 06/07/2008   LDLCALC 117* 07/15/2012   ALT 24 01/03/2013   AST 21 01/03/2013   NA 138 01/03/2013   K 4.2 01/03/2013   CL 101 07/15/2012   CREATININE 0.7 01/03/2013   BUN 14.3 01/03/2013   CO2 28 01/03/2013   TSH 0.82 07/15/2012          Assessment & Plan:

## 2014-01-25 NOTE — Assessment & Plan Note (Signed)
TSH 

## 2014-01-25 NOTE — Progress Notes (Signed)
Pre visit review using our clinic review tool, if applicable. No additional management support is needed unless otherwise documented below in the visit note. 

## 2014-01-25 NOTE — Patient Instructions (Signed)
Preventive Care for Adults A healthy lifestyle and preventive care can promote health and wellness. Preventive health guidelines for women include the following key practices.  A routine yearly physical is a good way to check with your health care provider about your health and preventive screening. It is a chance to share any concerns and updates on your health and to receive a thorough exam.  Visit your dentist for a routine exam and preventive care every 6 months. Brush your teeth twice a day and floss once a day. Good oral hygiene prevents tooth decay and gum disease.  The frequency of eye exams is based on your age, health, family medical history, use of contact lenses, and other factors. Follow your health care provider's recommendations for frequency of eye exams.  Eat a healthy diet. Foods like vegetables, fruits, whole grains, low-fat dairy products, and lean protein foods contain the nutrients you need without too many calories. Decrease your intake of foods high in solid fats, added sugars, and salt. Eat the right amount of calories for you.Get information about a proper diet from your health care provider, if necessary.  Regular physical exercise is one of the most important things you can do for your health. Most adults should get at least 150 minutes of moderate-intensity exercise (any activity that increases your heart rate and causes you to sweat) each week. In addition, most adults need muscle-strengthening exercises on 2 or more days a week.  Maintain a healthy weight. The body mass index (BMI) is a screening tool to identify possible weight problems. It provides an estimate of body fat based on height and weight. Your health care provider can find your BMI and can help you achieve or maintain a healthy weight.For adults 20 years and older:  A BMI below 18.5 is considered underweight.  A BMI of 18.5 to 24.9 is normal.  A BMI of 25 to 29.9 is considered overweight.  A BMI of  30 and above is considered obese.  Maintain normal blood lipids and cholesterol levels by exercising and minimizing your intake of saturated fat. Eat a balanced diet with plenty of fruit and vegetables. Blood tests for lipids and cholesterol should begin at age 76 and be repeated every 5 years. If your lipid or cholesterol levels are high, you are over 50, or you are at high risk for heart disease, you may need your cholesterol levels checked more frequently.Ongoing high lipid and cholesterol levels should be treated with medicines if diet and exercise are not working.  If you smoke, find out from your health care provider how to quit. If you do not use tobacco, do not start.  Lung cancer screening is recommended for adults aged 22-80 years who are at high risk for developing lung cancer because of a history of smoking. A yearly low-dose CT scan of the lungs is recommended for people who have at least a 30-pack-year history of smoking and are a current smoker or have quit within the past 15 years. A pack year of smoking is smoking an average of 1 pack of cigarettes a day for 1 year (for example: 1 pack a day for 30 years or 2 packs a day for 15 years). Yearly screening should continue until the smoker has stopped smoking for at least 15 years. Yearly screening should be stopped for people who develop a health problem that would prevent them from having lung cancer treatment.  If you are pregnant, do not drink alcohol. If you are breastfeeding,  be very cautious about drinking alcohol. If you are not pregnant and choose to drink alcohol, do not have more than 1 drink per day. One drink is considered to be 12 ounces (355 mL) of beer, 5 ounces (148 mL) of wine, or 1.5 ounces (44 mL) of liquor.  Avoid use of street drugs. Do not share needles with anyone. Ask for help if you need support or instructions about stopping the use of drugs.  High blood pressure causes heart disease and increases the risk of  stroke. Your blood pressure should be checked at least every 1 to 2 years. Ongoing high blood pressure should be treated with medicines if weight loss and exercise do not work.  If you are 75-52 years old, ask your health care provider if you should take aspirin to prevent strokes.  Diabetes screening involves taking a blood sample to check your fasting blood sugar level. This should be done once every 3 years, after age 15, if you are within normal weight and without risk factors for diabetes. Testing should be considered at a younger age or be carried out more frequently if you are overweight and have at least 1 risk factor for diabetes.  Breast cancer screening is essential preventive care for women. You should practice "breast self-awareness." This means understanding the normal appearance and feel of your breasts and may include breast self-examination. Any changes detected, no matter how small, should be reported to a health care provider. Women in their 58s and 30s should have a clinical breast exam (CBE) by a health care provider as part of a regular health exam every 1 to 3 years. After age 16, women should have a CBE every year. Starting at age 53, women should consider having a mammogram (breast X-ray test) every year. Women who have a family history of breast cancer should talk to their health care provider about genetic screening. Women at a high risk of breast cancer should talk to their health care providers about having an MRI and a mammogram every year.  Breast cancer gene (BRCA)-related cancer risk assessment is recommended for women who have family members with BRCA-related cancers. BRCA-related cancers include breast, ovarian, tubal, and peritoneal cancers. Having family members with these cancers may be associated with an increased risk for harmful changes (mutations) in the breast cancer genes BRCA1 and BRCA2. Results of the assessment will determine the need for genetic counseling and  BRCA1 and BRCA2 testing.  Routine pelvic exams to screen for cancer are no longer recommended for nonpregnant women who are considered low risk for cancer of the pelvic organs (ovaries, uterus, and vagina) and who do not have symptoms. Ask your health care provider if a screening pelvic exam is right for you.  If you have had past treatment for cervical cancer or a condition that could lead to cancer, you need Pap tests and screening for cancer for at least 20 years after your treatment. If Pap tests have been discontinued, your risk factors (such as having a new sexual partner) need to be reassessed to determine if screening should be resumed. Some women have medical problems that increase the chance of getting cervical cancer. In these cases, your health care provider may recommend more frequent screening and Pap tests.  The HPV test is an additional test that may be used for cervical cancer screening. The HPV test looks for the virus that can cause the cell changes on the cervix. The cells collected during the Pap test can be  tested for HPV. The HPV test could be used to screen women aged 30 years and older, and should be used in women of any age who have unclear Pap test results. After the age of 30, women should have HPV testing at the same frequency as a Pap test.  Colorectal cancer can be detected and often prevented. Most routine colorectal cancer screening begins at the age of 50 years and continues through age 75 years. However, your health care provider may recommend screening at an earlier age if you have risk factors for colon cancer. On a yearly basis, your health care provider may provide home test kits to check for hidden blood in the stool. Use of a small camera at the end of a tube, to directly examine the colon (sigmoidoscopy or colonoscopy), can detect the earliest forms of colorectal cancer. Talk to your health care provider about this at age 50, when routine screening begins. Direct  exam of the colon should be repeated every 5-10 years through age 75 years, unless early forms of pre-cancerous polyps or small growths are found.  People who are at an increased risk for hepatitis B should be screened for this virus. You are considered at high risk for hepatitis B if:  You were born in a country where hepatitis B occurs often. Talk with your health care provider about which countries are considered high risk.  Your parents were born in a high-risk country and you have not received a shot to protect against hepatitis B (hepatitis B vaccine).  You have HIV or AIDS.  You use needles to inject street drugs.  You live with, or have sex with, someone who has hepatitis B.  You get hemodialysis treatment.  You take certain medicines for conditions like cancer, organ transplantation, and autoimmune conditions.  Hepatitis C blood testing is recommended for all people born from 1945 through 1965 and any individual with known risks for hepatitis C.  Practice safe sex. Use condoms and avoid high-risk sexual practices to reduce the spread of sexually transmitted infections (STIs). STIs include gonorrhea, chlamydia, syphilis, trichomonas, herpes, HPV, and human immunodeficiency virus (HIV). Herpes, HIV, and HPV are viral illnesses that have no cure. They can result in disability, cancer, and death.  You should be screened for sexually transmitted illnesses (STIs) including gonorrhea and chlamydia if:  You are sexually active and are younger than 24 years.  You are older than 24 years and your health care provider tells you that you are at risk for this type of infection.  Your sexual activity has changed since you were last screened and you are at an increased risk for chlamydia or gonorrhea. Ask your health care provider if you are at risk.  If you are at risk of being infected with HIV, it is recommended that you take a prescription medicine daily to prevent HIV infection. This is  called preexposure prophylaxis (PrEP). You are considered at risk if:  You are a heterosexual woman, are sexually active, and are at increased risk for HIV infection.  You take drugs by injection.  You are sexually active with a partner who has HIV.  Talk with your health care provider about whether you are at high risk of being infected with HIV. If you choose to begin PrEP, you should first be tested for HIV. You should then be tested every 3 months for as long as you are taking PrEP.  Osteoporosis is a disease in which the bones lose minerals and strength   with aging. This can result in serious bone fractures or breaks. The risk of osteoporosis can be identified using a bone density scan. Women ages 65 years and over and women at risk for fractures or osteoporosis should discuss screening with their health care providers. Ask your health care provider whether you should take a calcium supplement or vitamin D to reduce the rate of osteoporosis.  Menopause can be associated with physical symptoms and risks. Hormone replacement therapy is available to decrease symptoms and risks. You should talk to your health care provider about whether hormone replacement therapy is right for you.  Use sunscreen. Apply sunscreen liberally and repeatedly throughout the day. You should seek shade when your shadow is shorter than you. Protect yourself by wearing long sleeves, pants, a wide-brimmed hat, and sunglasses year round, whenever you are outdoors.  Once a month, do a whole body skin exam, using a mirror to look at the skin on your back. Tell your health care provider of new moles, moles that have irregular borders, moles that are larger than a pencil eraser, or moles that have changed in shape or color.  Stay current with required vaccines (immunizations).  Influenza vaccine. All adults should be immunized every year.  Tetanus, diphtheria, and acellular pertussis (Td, Tdap) vaccine. Pregnant women should  receive 1 dose of Tdap vaccine during each pregnancy. The dose should be obtained regardless of the length of time since the last dose. Immunization is preferred during the 27th-36th week of gestation. An adult who has not previously received Tdap or who does not know her vaccine status should receive 1 dose of Tdap. This initial dose should be followed by tetanus and diphtheria toxoids (Td) booster doses every 10 years. Adults with an unknown or incomplete history of completing a 3-dose immunization series with Td-containing vaccines should begin or complete a primary immunization series including a Tdap dose. Adults should receive a Td booster every 10 years.  Varicella vaccine. An adult without evidence of immunity to varicella should receive 2 doses or a second dose if she has previously received 1 dose. Pregnant females who do not have evidence of immunity should receive the first dose after pregnancy. This first dose should be obtained before leaving the health care facility. The second dose should be obtained 4-8 weeks after the first dose.  Human papillomavirus (HPV) vaccine. Females aged 13-26 years who have not received the vaccine previously should obtain the 3-dose series. The vaccine is not recommended for use in pregnant females. However, pregnancy testing is not needed before receiving a dose. If a female is found to be pregnant after receiving a dose, no treatment is needed. In that case, the remaining doses should be delayed until after the pregnancy. Immunization is recommended for any person with an immunocompromised condition through the age of 26 years if she did not get any or all doses earlier. During the 3-dose series, the second dose should be obtained 4-8 weeks after the first dose. The third dose should be obtained 24 weeks after the first dose and 16 weeks after the second dose.  Zoster vaccine. One dose is recommended for adults aged 60 years or older unless certain conditions are  present.  Measles, mumps, and rubella (MMR) vaccine. Adults born before 1957 generally are considered immune to measles and mumps. Adults born in 1957 or later should have 1 or more doses of MMR vaccine unless there is a contraindication to the vaccine or there is laboratory evidence of immunity to   each of the three diseases. A routine second dose of MMR vaccine should be obtained at least 28 days after the first dose for students attending postsecondary schools, health care workers, or international travelers. People who received inactivated measles vaccine or an unknown type of measles vaccine during 1963-1967 should receive 2 doses of MMR vaccine. People who received inactivated mumps vaccine or an unknown type of mumps vaccine before 1979 and are at high risk for mumps infection should consider immunization with 2 doses of MMR vaccine. For females of childbearing age, rubella immunity should be determined. If there is no evidence of immunity, females who are not pregnant should be vaccinated. If there is no evidence of immunity, females who are pregnant should delay immunization until after pregnancy. Unvaccinated health care workers born before 1957 who lack laboratory evidence of measles, mumps, or rubella immunity or laboratory confirmation of disease should consider measles and mumps immunization with 2 doses of MMR vaccine or rubella immunization with 1 dose of MMR vaccine.  Pneumococcal 13-valent conjugate (PCV13) vaccine. When indicated, a person who is uncertain of her immunization history and has no record of immunization should receive the PCV13 vaccine. An adult aged 19 years or older who has certain medical conditions and has not been previously immunized should receive 1 dose of PCV13 vaccine. This PCV13 should be followed with a dose of pneumococcal polysaccharide (PPSV23) vaccine. The PPSV23 vaccine dose should be obtained at least 8 weeks after the dose of PCV13 vaccine. An adult aged 19  years or older who has certain medical conditions and previously received 1 or more doses of PPSV23 vaccine should receive 1 dose of PCV13. The PCV13 vaccine dose should be obtained 1 or more years after the last PPSV23 vaccine dose.  Pneumococcal polysaccharide (PPSV23) vaccine. When PCV13 is also indicated, PCV13 should be obtained first. All adults aged 65 years and older should be immunized. An adult younger than age 65 years who has certain medical conditions should be immunized. Any person who resides in a nursing home or long-term care facility should be immunized. An adult smoker should be immunized. People with an immunocompromised condition and certain other conditions should receive both PCV13 and PPSV23 vaccines. People with human immunodeficiency virus (HIV) infection should be immunized as soon as possible after diagnosis. Immunization during chemotherapy or radiation therapy should be avoided. Routine use of PPSV23 vaccine is not recommended for American Indians, [place] Natives, or people younger than 65 years unless there are medical conditions that require PPSV23 vaccine. When indicated, people who have unknown immunization and have no record of immunization should receive PPSV23 vaccine. One-time revaccination 5 years after the first dose of PPSV23 is recommended for people aged 19-64 years who have chronic kidney failure, nephrotic syndrome, asplenia, or immunocompromised conditions. People who received 1-2 doses of PPSV23 before age 65 years should receive another dose of PPSV23 vaccine at age 65 years or later if at least 5 years have passed since the previous dose. Doses of PPSV23 are not needed for people immunized with PPSV23 at or after age 65 years.  Meningococcal vaccine. Adults with asplenia or persistent complement component deficiencies should receive 2 doses of quadrivalent meningococcal conjugate (MenACWY-D) vaccine. The doses should be obtained at least 2 months apart.  Microbiologists working with certain meningococcal bacteria, military recruits, people at risk during an outbreak, and people who travel to or live in countries with a high rate of meningitis should be immunized. A first-year college student up through age   21 years who is living in a residence hall should receive a dose if she did not receive a dose on or after her 16th birthday. Adults who have certain high-risk conditions should receive one or more doses of vaccine.  Hepatitis A vaccine. Adults who wish to be protected from this disease, have certain high-risk conditions, work with hepatitis A-infected animals, work in hepatitis A research labs, or travel to or work in countries with a high rate of hepatitis A should be immunized. Adults who were previously unvaccinated and who anticipate close contact with an international adoptee during the first 60 days after arrival in the [place] from a country with a high rate of hepatitis A should be immunized.  Hepatitis B vaccine. Adults who wish to be protected from this disease, have certain high-risk conditions, may be exposed to blood or other infectious body fluids, are household contacts or sex partners of hepatitis B positive people, are clients or workers in certain care facilities, or travel to or work in countries with a high rate of hepatitis B should be immunized.  Haemophilus influenzae type b (Hib) vaccine. A previously unvaccinated person with asplenia or sickle cell disease or having a scheduled splenectomy should receive 1 dose of Hib vaccine. Regardless of previous immunization, a recipient of a hematopoietic stem cell transplant should receive a 3-dose series 6-12 months after her successful transplant. Hib vaccine is not recommended for adults with HIV infection. Preventive Services / Frequency Ages 64 to 68 years  Blood pressure check.** / Every 1 to 2 years.  Lipid and cholesterol check.** / Every 5 years beginning at age  22.  Clinical breast exam.** / Every 3 years for women in their 88s and 53s.  BRCA-related cancer risk assessment.** / For women who have family members with a BRCA-related cancer (breast, ovarian, tubal, or peritoneal cancers).  Pap test.** / Every 2 years from ages 90 through 51. Every 3 years starting at age 21 through age 56 or 3 with a history of 3 consecutive normal Pap tests.  HPV screening.** / Every 3 years from ages 24 through ages 1 to 46 with a history of 3 consecutive normal Pap tests.  Hepatitis C blood test.** / For any individual with known risks for hepatitis C.  Skin self-exam. / Monthly.  Influenza vaccine. / Every year.  Tetanus, diphtheria, and acellular pertussis (Tdap, Td) vaccine.** / Consult your health care provider. Pregnant women should receive 1 dose of Tdap vaccine during each pregnancy. 1 dose of Td every 10 years.  Varicella vaccine.** / Consult your health care provider. Pregnant females who do not have evidence of immunity should receive the first dose after pregnancy.  HPV vaccine. / 3 doses over 6 months, if 72 and younger. The vaccine is not recommended for use in pregnant females. However, pregnancy testing is not needed before receiving a dose.  Measles, mumps, rubella (MMR) vaccine.** / You need at least 1 dose of MMR if you were born in 1957 or later. You may also need a 2nd dose. For females of childbearing age, rubella immunity should be determined. If there is no evidence of immunity, females who are not pregnant should be vaccinated. If there is no evidence of immunity, females who are pregnant should delay immunization until after pregnancy.  Pneumococcal 13-valent conjugate (PCV13) vaccine.** / Consult your health care provider.  Pneumococcal polysaccharide (PPSV23) vaccine.** / 1 to 2 doses if you smoke cigarettes or if you have certain conditions.  Meningococcal vaccine.** /  1 dose if you are age 19 to 21 years and a first-year college  student living in a residence hall, or have one of several medical conditions, you need to get vaccinated against meningococcal disease. You may also need additional booster doses.  Hepatitis A vaccine.** / Consult your health care provider.  Hepatitis B vaccine.** / Consult your health care provider.  Haemophilus influenzae type b (Hib) vaccine.** / Consult your health care provider. Ages 40 to 64 years  Blood pressure check.** / Every 1 to 2 years.  Lipid and cholesterol check.** / Every 5 years beginning at age 20 years.  Lung cancer screening. / Every year if you are aged 55-80 years and have a 30-pack-year history of smoking and currently smoke or have quit within the past 15 years. Yearly screening is stopped once you have quit smoking for at least 15 years or develop a health problem that would prevent you from having lung cancer treatment.  Clinical breast exam.** / Every year after age 40 years.  BRCA-related cancer risk assessment.** / For women who have family members with a BRCA-related cancer (breast, ovarian, tubal, or peritoneal cancers).  Mammogram.** / Every year beginning at age 40 years and continuing for as long as you are in good health. Consult with your health care provider.  Pap test.** / Every 3 years starting at age 30 years through age 65 or 70 years with a history of 3 consecutive normal Pap tests.  HPV screening.** / Every 3 years from ages 30 years through ages 65 to 70 years with a history of 3 consecutive normal Pap tests.  Fecal occult blood test (FOBT) of stool. / Every year beginning at age 50 years and continuing until age 75 years. You may not need to do this test if you get a colonoscopy every 10 years.  Flexible sigmoidoscopy or colonoscopy.** / Every 5 years for a flexible sigmoidoscopy or every 10 years for a colonoscopy beginning at age 50 years and continuing until age 75 years.  Hepatitis C blood test.** / For all people born from 1945 through  1965 and any individual with known risks for hepatitis C.  Skin self-exam. / Monthly.  Influenza vaccine. / Every year.  Tetanus, diphtheria, and acellular pertussis (Tdap/Td) vaccine.** / Consult your health care provider. Pregnant women should receive 1 dose of Tdap vaccine during each pregnancy. 1 dose of Td every 10 years.  Varicella vaccine.** / Consult your health care provider. Pregnant females who do not have evidence of immunity should receive the first dose after pregnancy.  Zoster vaccine.** / 1 dose for adults aged 60 years or older.  Measles, mumps, rubella (MMR) vaccine.** / You need at least 1 dose of MMR if you were born in 1957 or later. You may also need a 2nd dose. For females of childbearing age, rubella immunity should be determined. If there is no evidence of immunity, females who are not pregnant should be vaccinated. If there is no evidence of immunity, females who are pregnant should delay immunization until after pregnancy.  Pneumococcal 13-valent conjugate (PCV13) vaccine.** / Consult your health care provider.  Pneumococcal polysaccharide (PPSV23) vaccine.** / 1 to 2 doses if you smoke cigarettes or if you have certain conditions.  Meningococcal vaccine.** / Consult your health care provider.  Hepatitis A vaccine.** / Consult your health care provider.  Hepatitis B vaccine.** / Consult your health care provider.  Haemophilus influenzae type b (Hib) vaccine.** / Consult your health care provider. Ages 65   years and over  Blood pressure check.** / Every 1 to 2 years.  Lipid and cholesterol check.** / Every 5 years beginning at age 39 years.  Lung cancer screening. / Every year if you are aged 86-80 years and have a 30-pack-year history of smoking and currently smoke or have quit within the past 15 years. Yearly screening is stopped once you have quit smoking for at least 15 years or develop a health problem that would prevent you from having lung cancer  treatment.  Clinical breast exam.** / Every year after age 38 years.  BRCA-related cancer risk assessment.** / For women who have family members with a BRCA-related cancer (breast, ovarian, tubal, or peritoneal cancers).  Mammogram.** / Every year beginning at age 3 years and continuing for as long as you are in good health. Consult with your health care provider.  Pap test.** / Every 3 years starting at age 17 years through age 54 or 15 years with 3 consecutive normal Pap tests. Testing can be stopped between 65 and 70 years with 3 consecutive normal Pap tests and no abnormal Pap or HPV tests in the past 10 years.  HPV screening.** / Every 3 years from ages 62 years through ages 52 or 32 years with a history of 3 consecutive normal Pap tests. Testing can be stopped between 65 and 70 years with 3 consecutive normal Pap tests and no abnormal Pap or HPV tests in the past 10 years.  Fecal occult blood test (FOBT) of stool. / Every year beginning at age 49 years and continuing until age 73 years. You may not need to do this test if you get a colonoscopy every 10 years.  Flexible sigmoidoscopy or colonoscopy.** / Every 5 years for a flexible sigmoidoscopy or every 10 years for a colonoscopy beginning at age 5 years and continuing until age 59 years.  Hepatitis C blood test.** / For all people born from [birth year] through 1967 and any individual with known risks for hepatitis C.  Osteoporosis screening.** / A one-time screening for women ages 40 years and over and women at risk for fractures or osteoporosis.  Skin self-exam. / Monthly.  Influenza vaccine. / Every year.  Tetanus, diphtheria, and acellular pertussis (Tdap/Td) vaccine.** / 1 dose of Td every 10 years.  Varicella vaccine.** / Consult your health care provider.  Zoster vaccine.** / 1 dose for adults aged 26 years or older.  Pneumococcal 13-valent conjugate (PCV13) vaccine.** / Consult your health care provider.  Pneumococcal  polysaccharide (PPSV23) vaccine.** / 1 dose for all adults aged 53 years and older.  Meningococcal vaccine.** / Consult your health care provider.  Hepatitis A vaccine.** / Consult your health care provider.  Hepatitis B vaccine.** / Consult your health care provider.  Haemophilus influenzae type b (Hib) vaccine.** / Consult your health care provider. ** Family history and personal history of risk and conditions may change your health care provider's recommendations. Document Released: 05/13/2001 Document Revised: 08/01/2013 Document Reviewed: 08/12/2010 [facility] Patient Information 2015 [place], [place]. This information is not intended to replace advice given to you by your health care provider. Make sure you discuss any questions you have with your health care provider.

## 2014-01-30 ENCOUNTER — Encounter: Payer: Self-pay | Admitting: Internal Medicine

## 2014-02-09 DIAGNOSIS — H3531 Nonexudative age-related macular degeneration: Secondary | ICD-10-CM | POA: Diagnosis not present

## 2014-02-09 DIAGNOSIS — H04123 Dry eye syndrome of bilateral lacrimal glands: Secondary | ICD-10-CM | POA: Diagnosis not present

## 2014-03-03 ENCOUNTER — Telehealth: Payer: Self-pay | Admitting: Obstetrics & Gynecology

## 2014-03-03 DIAGNOSIS — M8580 Other specified disorders of bone density and structure, unspecified site: Secondary | ICD-10-CM

## 2014-03-03 NOTE — Telephone Encounter (Signed)
Order placed for BMD to [facility].  Routing to provider for final review. Patient agreeable to disposition. Will close encounter

## 2014-03-03 NOTE — Telephone Encounter (Signed)
[facility] radiology calling requesting an order for a bone density. Patient has an appointment 03/06/14.

## 2014-03-06 ENCOUNTER — Ambulatory Visit
Admission: RE | Admit: 2014-03-06 | Discharge: 2014-03-06 | Disposition: A | Discharge status: Home / Self Care | Payer: Medicare Other | Source: Ambulatory Visit | Attending: Obstetrics & Gynecology | Admitting: Obstetrics & Gynecology

## 2014-03-06 ENCOUNTER — Ambulatory Visit (HOSPITAL_COMMUNITY)
Admission: RE | Admit: 2014-03-06 | Discharge: 2014-03-06 | Disposition: A | Discharge status: Home / Self Care | Payer: Medicare Other | Source: Ambulatory Visit | Attending: Obstetrics & Gynecology | Admitting: Obstetrics & Gynecology

## 2014-03-06 DIAGNOSIS — Z780 Asymptomatic menopausal state: Secondary | ICD-10-CM | POA: Insufficient documentation

## 2014-03-06 DIAGNOSIS — Z13820 Encounter for screening for osteoporosis: Secondary | ICD-10-CM | POA: Diagnosis not present

## 2014-03-06 DIAGNOSIS — R921 Mammographic calcification found on diagnostic imaging of breast: Secondary | ICD-10-CM | POA: Diagnosis not present

## 2014-03-06 DIAGNOSIS — M8580 Other specified disorders of bone density and structure, unspecified site: Secondary | ICD-10-CM

## 2014-03-06 DIAGNOSIS — Z853 Personal history of malignant neoplasm of breast: Secondary | ICD-10-CM | POA: Diagnosis not present

## 2014-03-06 DIAGNOSIS — M8589 Other specified disorders of bone density and structure, multiple sites: Secondary | ICD-10-CM | POA: Diagnosis not present

## 2014-03-09 DIAGNOSIS — G43109 Migraine with aura, not intractable, without status migrainosus: Secondary | ICD-10-CM | POA: Diagnosis not present

## 2014-03-17 ENCOUNTER — Other Ambulatory Visit: Payer: Self-pay

## 2014-03-17 DIAGNOSIS — L570 Actinic keratosis: Secondary | ICD-10-CM | POA: Diagnosis not present

## 2014-03-17 DIAGNOSIS — L814 Other melanin hyperpigmentation: Secondary | ICD-10-CM | POA: Diagnosis not present

## 2014-03-17 DIAGNOSIS — L700 Acne vulgaris: Secondary | ICD-10-CM | POA: Diagnosis not present

## 2014-03-17 DIAGNOSIS — L738 Other specified follicular disorders: Secondary | ICD-10-CM | POA: Diagnosis not present

## 2014-03-17 DIAGNOSIS — B351 Tinea unguium: Secondary | ICD-10-CM | POA: Diagnosis not present

## 2014-03-17 DIAGNOSIS — D1801 Hemangioma of skin and subcutaneous tissue: Secondary | ICD-10-CM | POA: Diagnosis not present

## 2014-03-17 DIAGNOSIS — D485 Neoplasm of uncertain behavior of skin: Secondary | ICD-10-CM | POA: Diagnosis not present

## 2014-03-17 DIAGNOSIS — D225 Melanocytic nevi of trunk: Secondary | ICD-10-CM | POA: Diagnosis not present

## 2014-03-17 DIAGNOSIS — L603 Nail dystrophy: Secondary | ICD-10-CM | POA: Diagnosis not present

## 2014-03-27 ENCOUNTER — Encounter: Payer: Self-pay | Admitting: Internal Medicine

## 2014-04-04 ENCOUNTER — Telehealth: Payer: Self-pay

## 2014-04-04 NOTE — Telephone Encounter (Signed)
Patient notified of [facility] BMD results. Copy will be scanned in epic.//kn 

## 2014-07-13 ENCOUNTER — Encounter: Payer: Self-pay | Admitting: Obstetrics & Gynecology

## 2014-07-13 ENCOUNTER — Ambulatory Visit (INDEPENDENT_AMBULATORY_CARE_PROVIDER_SITE_OTHER): Payer: Medicare Other | Admitting: Obstetrics & Gynecology

## 2014-07-13 VITALS — BP 162/94 | HR 64 | Resp 16 | Ht 65.0 in | Wt 128.0 lb

## 2014-07-13 DIAGNOSIS — Z01419 Encounter for gynecological examination (general) (routine) without abnormal findings: Secondary | ICD-10-CM

## 2014-07-13 DIAGNOSIS — Z124 Encounter for screening for malignant neoplasm of cervix: Secondary | ICD-10-CM | POA: Diagnosis not present

## 2014-07-13 NOTE — Progress Notes (Signed)
Patient ID: Caroline Olson, female   DOB: 03-Jan-1941, 74 y.o.   MRN: [id number]   74 y.o. G2P2 MarriedCaucasianF here for annual exam.  Doing well.  No vaginal bleeding.  Doesn't see Caroline Olson any longer.  Stopped seeing him in 2013.   Pt reports sister's daughter recently diagnosed with DCIS plus triple negative breast cancer.  Pt is pretty sure niece had negative genetic testing.    Pt did go and see Caroline Olson last summer.  Pt reports she feels much better from an incontinence standpoint and urgency standpoint.    Patient's last menstrual period was 04/01/1983.          Sexually active: Yes.    The current method of family planning is status post hysterectomy.   Exercising: Yes.    walking 2.5 miles, approx. 40 minutes, everyday; daily goal of 10,000 steps Smoker:  no  Health Maintenance: Pap:  03/10/11, negative  History of abnormal Pap:  no MMG:  03/06/14, Bi-Rads 2:  Benign Colonoscopy:  09/10/12, tubular adenoma, repeat in 5 years BMD:   03/06/14, T Score: -1.6 S/-1.8 R/-1.9 L TDaP:  06/30/13  Screening Labs: 01/25/14 in EPIC, Hb today: PCP, Urine today: PCP   reports that she has never smoked. She has never used smokeless tobacco. She reports that she drinks about 3.0 oz of alcohol per week. She reports that she does not use illicit drugs.  Past Medical History  Diagnosis Date  . 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 Varicose veins   . Osteopenia     Past Surgical History  Procedure Laterality Date  . Abdominal hysterectomy  1985  . Shoulder surgery  2003    tear  . Breast lumpectomy  2011    left, reexcision 1/12  . Triceps tendon repair  2009  . Appendectomy  1956    pt states appendix was on left side not rt. per surgeon  . Rotator cuff repair Right 2009  . Endovenous ablation saphenous vein w/ laser Left 01-27-2013    left greater saphenous vein and sclerotherapy left leg by Caroline Jews MD  . Endovenous ablation saphenous vein w/ laser Right 02-10-2013    right greater saphenous  vein and sclerotherapy right leg by Caroline Jews MD  . Stab phlebectomy Left 07-21-2013    10-20 incisions left leg by Caroline Jews MD    Current Outpatient Prescriptions  Medication Sig Dispense Refill  . cholecalciferol (VITAMIN D) 1000 UNITS tablet Take 1,000 Units by mouth daily.    Marland Kitchen desonide (DESOWEN) 0.05 % cream Apply topically as needed.     . Glucosamine Sulfate 500 MG TABS Take by mouth 2 (two) times daily.    . metronidazole (NORITATE) 1 % cream Apply topically 2 (two) times daily.     . Multiple Vitamins-Minerals (MULTIVITAMIN WITH MINERALS) tablet Take 1 tablet by mouth 3 (three) times a week.     . Omega-3 Fatty Acids (FISH OIL CONCENTRATE PO) Take by mouth. 1 tsp daily     No current facility-administered medications for this visit.    Family History  Problem Relation Age of Onset  . Cancer Mother   . Hypertension Mother   . Other Mother     varicose veins  . Heart disease Father   . Diabetes Father   . Colon cancer Neg Hx     ROS:  Pertinent items are noted in HPI.  Otherwise, a comprehensive ROS was negative.  Exam:   BP 162/94 mmHg  Pulse 64  Resp 16  Ht 5\' 5"  (1.651 m)  Wt 128 lb (58.06 kg)  BMI 21.30 kg/m2  LMP 04/01/1983   Height: 5\' 5"  (165.1 cm)  Ht Readings from Last 3 Encounters:  07/13/14 5\' 5"  (1.651 m)  01/25/14 5\' 5"  (1.651 m)  08/04/13 5' 5.5" (1.664 m)    General appearance: alert, cooperative and appears stated age Head: Normocephalic, without obvious abnormality, atraumatic Neck: no adenopathy, supple, symmetrical, trachea midline and thyroid normal to inspection and palpation Lungs: clear to auscultation bilaterally Breasts: normal appearance, no masses or tenderness, on right, on left there is a well healed incision with radiaiton changes.  no change since last year. Heart: regular rate and rhythm Abdomen: soft, non-tender; bowel sounds normal; no masses,  no organomegaly Extremities: extremities normal, atraumatic, no cyanosis or  edema Skin: Skin color, texture, turgor normal. No rashes or lesions Lymph nodes: Cervical, supraclavicular, and axillary nodes normal. No abnormal inguinal nodes palpated Neurologic: Grossly normal   Pelvic: External genitalia:  no lesions              Urethra:  normal appearing urethra with no masses, tenderness or lesions              Bartholins and Skenes: normal                 Vagina: normal appearing vagina with normal color and discharge, no lesions              Cervix: absent              Pap taken: No. Bimanual Exam:  Uterus:  uterus absent              Adnexa: no mass, fullness, tenderness               Rectovaginal: Confirms               Anus:  normal sphincter tone, no lesions  Chaperone was present for exam.  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  No longer followed by oncology Osteopenia SUI.  Improved after pelvic PT last year White coat htn?  Pt reports this is elevated at Caroline Olson office.  Pt tests this outside of MD office and reports it is totally normal.  Pt aware it is elevated today and will check it more frequently for next few weeks to make sure it is not staying elevated.  Knows to call Dr. Alain Olson if this remains elevated.   P: Mammogram yearly pap smear not indicated. No pap obtained. Seeing Dr. Alain Olson in the winter.  Will do screening labs with him.  Vaccines are UTD. return annually or prn

## 2014-09-27 DIAGNOSIS — M25461 Effusion, right knee: Secondary | ICD-10-CM | POA: Diagnosis not present

## 2014-09-27 DIAGNOSIS — M1711 Unilateral primary osteoarthritis, right knee: Secondary | ICD-10-CM | POA: Diagnosis not present

## 2015-01-11 ENCOUNTER — Telehealth: Payer: Self-pay

## 2015-01-11 NOTE — Telephone Encounter (Signed)
Call to Mrs. Wale regarding AWV and stated she would be happy to come in; Personal hx is focused on Wellness;  Agreed to come in but was concerned about charges; Scheduled to come in at 9am for AWV and then will see Dr. Camila Li at 9:45am on 11/2

## 2015-01-31 ENCOUNTER — Encounter: Payer: Medicare Other | Admitting: Internal Medicine

## 2015-02-05 ENCOUNTER — Ambulatory Visit (INDEPENDENT_AMBULATORY_CARE_PROVIDER_SITE_OTHER): Payer: Medicare Other | Admitting: Internal Medicine

## 2015-02-05 ENCOUNTER — Other Ambulatory Visit: Payer: Self-pay | Admitting: Obstetrics & Gynecology

## 2015-02-05 ENCOUNTER — Encounter: Payer: Self-pay | Admitting: Internal Medicine

## 2015-02-05 ENCOUNTER — Other Ambulatory Visit: Payer: Self-pay

## 2015-02-05 VITALS — BP 160/80 | HR 63 | Temp 97.9°F | Ht 65.0 in | Wt 129.0 lb

## 2015-02-05 DIAGNOSIS — I10 Essential (primary) hypertension: Secondary | ICD-10-CM | POA: Insufficient documentation

## 2015-02-05 DIAGNOSIS — R431 Parosmia: Secondary | ICD-10-CM | POA: Diagnosis not present

## 2015-02-05 DIAGNOSIS — Z0000 Encounter for general adult medical examination without abnormal findings: Secondary | ICD-10-CM | POA: Diagnosis not present

## 2015-02-05 DIAGNOSIS — Z86010 Personal history of colonic polyps: Secondary | ICD-10-CM | POA: Insufficient documentation

## 2015-02-05 DIAGNOSIS — E785 Hyperlipidemia, unspecified: Secondary | ICD-10-CM

## 2015-02-05 DIAGNOSIS — Z9889 Other specified postprocedural states: Secondary | ICD-10-CM

## 2015-02-05 DIAGNOSIS — R439 Unspecified disturbances of smell and taste: Secondary | ICD-10-CM

## 2015-02-05 DIAGNOSIS — C50412 Malignant neoplasm of upper-outer quadrant of left female breast: Secondary | ICD-10-CM

## 2015-02-05 DIAGNOSIS — R030 Elevated blood-pressure reading, without diagnosis of hypertension: Secondary | ICD-10-CM

## 2015-02-05 DIAGNOSIS — Z853 Personal history of malignant neoplasm of breast: Secondary | ICD-10-CM

## 2015-02-05 DIAGNOSIS — IMO0001 Reserved for inherently not codable concepts without codable children: Secondary | ICD-10-CM

## 2015-02-05 NOTE — Assessment & Plan Note (Signed)
Colon due 2019

## 2015-02-05 NOTE — Progress Notes (Signed)
Subjective:   Caroline Olson is a 74 y.o. female who presents for Medicare Annual (Subsequent) preventive examination.  Review of Systems:  The Patient was informed that this wellness visit is to identify risk and educate on how to reduce risk for increase disease through lifestyle changes.   ROS deferred to CPE exam with physician  Medical issues include venous insufficiency and OA; occasionally has some discomfort with left leg; surgery on the right; Dr. 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 and they going out assessing homes of fall risk Breast cancer; 01/28/2013; Left lumpectomy   BMI: 21.9  Diet; Grand-dtr living with them;  Recently placed her sweets etc in cabinet for grand-dtr and she is eating more; BMI remains good  Exercise; Uses weights at times; was in women's health initiative Walk 2.5 miles each day;   SAFETY/ one level home;  Safety reviewed for the home; including removal of clutter; clear paths through the home, railing as needed; bathroom safety; community safety; smoke detectors and firearms safety   Driving accidents (No) Sun protection /when she walks; uses sun protection Stressors;   Medication review/ no  Fall assessment no falls   Mobilization and Functional losses in the last year./   Sleep patterns/sleeps well   Urinary or fecal incontinence reviewed/ no Bladder PT; Per Dr. Sabra Heck; went to Urinary doctor    Counseling: Colooscopy; June 2014; due June 2019 years pending path; high fiber diet EKG: 07/18/2011 Hearing: [id number]  Dexa 03/06/2014 ostepenia of spine and femur/ agreed to start low weight bearing exercises;  Stopped calcium;  Mammagram 12/27/2015No mammographic evidence of malignancy. / No changes since left breast lumpectomy  Ophthalmology exam; it has been a year and has apt next week; has a little macular degenerative; Areds for this and monitorig  Immunizations Due    Flu shot / plans to take next week on the [facility]th [facility] [employer] nurse has arranged; for all to get flu shots and she would like to support this effort  Current Care Team reviewed and updated  Cardiac Risk Factors include: advanced age (>73men, >11 women);dyslipidemia;hypertension     Objective:     Vitals: BP 160/80 mmHg  Pulse 63  Temp(Src) 97.9 F (36.6 C) (Oral)  Ht 5\' 5"  (1.651 m)  Wt 129 lb (58.514 kg)  BMI 21.47 kg/m2  SpO2 97%  LMP 04/01/1983  Tobacco History  Smoking status  . Never Smoker   Smokeless tobacco  . Never Used     Counseling given: Yes   Past Medical History  Diagnosis Date  . Breast cancer ([place]) 2011    DCIF  . Varicose veins   . Osteopenia    Past Surgical History  Procedure Laterality Date  . Abdominal hysterectomy  1985  . Shoulder surgery  2003    tear  . Breast lumpectomy  2011    left, reexcision 1/12  . Triceps tendon repair  2009  . Appendectomy  1956    pt states appendix was on left side not rt. per surgeon  . Rotator cuff repair Right 2009  . Endovenous ablation saphenous vein w/ laser Left 01-27-2013    left greater saphenous vein and sclerotherapy left leg by Curt Jews MD  . Endovenous ablation saphenous vein w/ laser Right 02-10-2013    right greater saphenous vein and sclerotherapy right leg by Curt Jews MD  . Stab phlebectomy Left 07-21-2013    10-20 incisions left  leg by Curt Jews MD   Family History  Problem Relation Age of Onset  . Cancer Mother   . Hypertension Mother   . Other Mother     varicose veins  . Heart disease Father   . Diabetes Father   . Colon cancer Neg Hx    History  Sexual Activity  . Sexual Activity:  . Partners: Male    Comment: TAH/BSO    Outpatient Encounter Prescriptions as of 02/05/2015  Medication Sig  . acetaminophen (TYLENOL) 500 MG tablet Take 500 mg by mouth 1 day or 1 dose.  . cholecalciferol (VITAMIN D) 1000 UNITS tablet Take 1,000 Units by mouth daily.  .  Glucosamine Sulfate 500 MG TABS Take by mouth 2 (two) times daily.  . metronidazole (NORITATE) 1 % cream Apply topically 2 (two) times daily.   . Multiple Vitamins-Minerals (MULTIVITAMIN WITH MINERALS) tablet Take 1 tablet by mouth 3 (three) times a week.   . Omega-3 Fatty Acids (FISH OIL CONCENTRATE PO) Take by mouth. 1 tsp daily  . [DISCONTINUED] desonide (DESOWEN) 0.05 % cream Apply topically as needed.    No facility-administered encounter medications on file as of 02/05/2015.    Activities of Daily Living In your present state of health, do you have any difficulty performing the following activities: 02/05/2015  Hearing? N  Vision? N  Difficulty concentrating or making decisions? N  Walking or climbing stairs? N  Dressing or bathing? N  Doing errands, shopping? N  Preparing Food and eating ? N  Using the Toilet? N  In the past six months, have you accidently leaked urine? (No Data)  Managing your Medications? N  Managing your Finances? N  Housekeeping or managing your Housekeeping? N    Patient Care Team: Cassandria Anger, MD as PCP - General Chauncey Cruel, MD (Hematology and Oncology) Coralie Keens, MD (General Surgery) Megan Salon, MD as Attending Physician (Gynecology)    Assessment:    Assessment   Patient presents for yearly preventative medicine examination. Medicare questionnaire screening were completed, i.e. Functional; fall risk; depression, memory loss and hearing and unremarkable  All immunizations and health maintenance protocols were reviewed with the patient and needed orders were placed. Plans to take flu shot with congregation next week / will call and confirm receipt  Education provided for laboratory screens;    Medication reconciliation, past medical history, social history, problem list and allergies were reviewed in detail with the patient  Goals were established with regard to low weights due to osteopenia   End of life planning  was discussed and completed and will bring a copy when available.   Exercise Activities and Dietary recommendations Current Exercise Habits:: Home exercise routine, Type of exercise: walking;strength training/weights, Time (Minutes): 30, Frequency (Times/Week): 5, Weekly Exercise (Minutes/Week): 150, Intensity: Moderate  Goals    . Exercise 150 minutes per week (moderate activity)     Will add low weight bearing exercises 2 times a week       Fall Risk Fall Risk  02/05/2015  Falls in the past year? No   Depression Screen PHQ 2/9 Scores 02/05/2015  PHQ - 2 Score 0     Cognitive Testing No flowsheet data found.   Ad8 score is 0   Immunization History  Administered Date(s) Administered  . Influenza-Unspecified 01/18/2013, 01/12/2014  . Pneumococcal Conjugate-13 01/25/2014  . Pneumococcal Polysaccharide-23 07/08/2009  . Td 05/30/2003  . Tdap 06/30/2013  . Zoster 06/07/2008   Screening Tests Health Maintenance  Topic Date Due  . INFLUENZA VACCINE  10/30/2014  . COLONOSCOPY  09/10/2017  . TETANUS/TDAP  07/01/2023  . DEXA SCAN  Completed  . ZOSTAVAX  Completed  . PNA vac Low Risk Adult  Completed      Plan:    During the course of the visit the patient was educated and counseled about the following appropriate screening and preventive services:   Vaccines to include Pneumoccal, Influenza, Hepatitis B, Td, Zostavax, HCV / to take flu next week per her choice  Electrocardiogram 07/18/2011  Cardiovascular Disease/ no issues; BP elevated; The patient agreed to check her BP several times during the week and the nurse will check at her church; States she feels it is due to being in the doctor's office; Asymptomatic; Discussed BP medication if needed; Will drink fluids; monitor sodium intake  Colorectal cancer screening/12/11/2012 ;   Bone density screening; 03/06/2014 osteopenia; agreed to low weights and monitor for 1200mg  of calicum in her diet  Diabetes  screening/deferred  Glaucoma screening  Mammography/PAP/ 03/26/14; has annual exam  Nutrition counseling/eats well; weight within normal limits   Patient Instructions (the written plan) was given to the patient.   Wynetta Fines, RN  02/05/2015

## 2015-02-05 NOTE — Progress Notes (Signed)
Subjective:  Patient ID: Caroline Olson, female    DOB: 1940/08/19  Age: 74 y.o. MRN: [id number]  CC: Medicare Wellness   HPI Caroline Olson presents for a well exam. BP is nl at home/church. C/o "starnge smell" at times at times since summer  Outpatient Prescriptions Prior to Visit  Medication Sig Dispense Refill  . cholecalciferol (VITAMIN D) 1000 UNITS tablet Take 1,000 Units by mouth daily.    . Glucosamine Sulfate 500 MG TABS Take by mouth 2 (two) times daily.    . metronidazole (NORITATE) 1 % cream Apply topically 2 (two) times daily.     . Multiple Vitamins-Minerals (MULTIVITAMIN WITH MINERALS) tablet Take 1 tablet by mouth 3 (three) times a week.     . Omega-3 Fatty Acids (FISH OIL CONCENTRATE PO) Take by mouth. 1 tsp daily    . desonide (DESOWEN) 0.05 % cream Apply topically as needed.      No facility-administered medications prior to visit.    ROS Review of Systems  Constitutional: Negative for chills, activity change, appetite change, fatigue and unexpected weight change.  HENT: Negative for congestion, mouth sores and sinus pressure.   Eyes: Negative for visual disturbance.  Respiratory: Negative for cough and chest tightness.   Gastrointestinal: Negative for nausea and abdominal pain.  Genitourinary: Negative for urgency, frequency, difficulty urinating and vaginal pain.  Musculoskeletal: Negative for back pain and gait problem.  Skin: Negative for pallor and rash.  Neurological: Negative for dizziness, tremors, weakness, numbness and headaches.  Psychiatric/Behavioral: Negative for suicidal ideas, confusion and sleep disturbance. The patient is not nervous/anxious.     Objective:  BP 160/80 mmHg  Pulse 63  Temp(Src) 97.9 F (36.6 C) (Oral)  Ht 5\' 5"  (1.651 m)  Wt 129 lb (58.514 kg)  BMI 21.47 kg/m2  SpO2 97%  LMP 04/01/1983  BP Readings from Last 3 Encounters:  02/05/15 160/80  07/13/14 162/94  01/25/14 158/86    Wt Readings from Last 3 Encounters:    02/05/15 129 lb (58.514 kg)  07/13/14 128 lb (58.06 kg)  01/25/14 127 lb (57.607 kg)    Physical Exam  Constitutional: She appears well-developed. No distress.  HENT:  Head: Normocephalic.  Right Ear: External ear normal.  Left Ear: External ear normal.  Nose: Nose normal.  Mouth/Throat: Oropharynx is clear and moist.  Eyes: Conjunctivae are normal. Pupils are equal, round, and reactive to light. Right eye exhibits no discharge. Left eye exhibits no discharge.  Neck: Normal range of motion. Neck supple. No JVD present. No tracheal deviation present. No thyromegaly present.  Cardiovascular: Normal rate, regular rhythm and normal heart sounds.   Pulmonary/Chest: No stridor. No respiratory distress. She has no wheezes.  Abdominal: Soft. Bowel sounds are normal. She exhibits no distension and no mass. There is no tenderness. There is no rebound and no guarding.  Musculoskeletal: She exhibits no edema or tenderness.  Lymphadenopathy:    She has no cervical adenopathy.  Neurological: She displays normal reflexes. No cranial nerve deficit. She exhibits normal muscle tone. Coordination normal.  Skin: No rash noted. No erythema.  Psychiatric: She has a normal mood and affect. Her behavior is normal. Judgment and thought content normal.    Lab Results  Component Value Date   WBC 5.5 01/25/2014   HGB 12.8 01/25/2014   HCT 38.7 01/25/2014   PLT 272.0 01/25/2014   GLUCOSE 97 01/25/2014   CHOL 220* 01/25/2014   TRIG 44.0 01/25/2014   HDL 94.00 01/25/2014  LDLDIRECT 104.3 06/07/2008   LDLCALC 117* 01/25/2014   ALT 15 01/25/2014   AST 20 01/25/2014   NA 137 01/25/2014   K 4.1 01/25/2014   CL 102 01/25/2014   CREATININE 0.6 01/25/2014   BUN 14 01/25/2014   CO2 28 01/25/2014   TSH 0.74 01/25/2014    Dg Bone Density  03/06/2014  EXAM: DG DXA BONE DENSITY STUDY The Bone Mineral Densitometry hard-copy report (which includes all data, graphical display, and FRAX results when  applicable) has been sent directly to the ordering physician. This report can also be obtained electronically by viewing images for this exam through the performing facility's EMR, or by logging directly into [employer]. Electronically Signed   By: Sabino Dick M.D.   On: 03/06/2014 10:00   Mm Digital Diagnostic Bilat  03/06/2014  CLINICAL DATA:  History of left breast cancer and lumpectomy EXAM: DIGITAL DIAGNOSTIC  BILATERAL MAMMOGRAM WITH CAD COMPARISON:  03/03/2013, 03/01/2012, 01/13/2012 and 02/27/2011 ACR Breast Density Category c: The breast tissue is heterogeneously dense, which may obscure small masses. FINDINGS: Postlumpectomy changes are noted within the subareolar left breast. Expected postsurgical architectural distortion is present scattered benign-appearing calcifications are noted bilaterally. The breasts are not significantly changed from prior. Mammographic images were processed with CAD. IMPRESSION: No mammographic evidence of malignancy. RECOMMENDATION: Recommend annual diagnostic mammograms. The patient should return sooner if clinically indicated. I have discussed the findings and recommendations with the patient. Results were also provided in writing at the conclusion of the visit. If applicable, a reminder letter will be sent to the patient regarding the next appointment. BI-RADS CATEGORY  2: Benign Finding(s) Electronically Signed   By: Rosemarie Ax   On: 03/06/2014 10:15    Assessment & Plan:   [place] was seen today for [employer].  Diagnoses and all orders for this visit:  Well adult exam -     Lipid panel; Future -     Basic metabolic panel; Future -     Hepatic function panel; Future -     CBC with Differential/Platelet; Future -     TSH; Future -     Urinalysis; Future  Routine history and physical examination of adult -     Lipid panel; Future -     Basic metabolic panel; Future -     Hepatic function panel; Future -     CBC with Differential/Platelet;  Future -     TSH; Future -     Urinalysis; Future  Breast cancer of upper-outer quadrant of left female breast ([place]) -     Lipid panel; Future -     Basic metabolic panel; Future -     Hepatic function panel; Future -     CBC with Differential/Platelet; Future -     TSH; Future -     Urinalysis; Future  History of colonic polyps -     Lipid panel; Future -     Basic metabolic panel; Future -     Hepatic function panel; Future -     CBC with Differential/Platelet; Future -     TSH; Future -     Urinalysis; Future  White coat hypertension -     Lipid panel; Future -     Basic metabolic panel; Future -     Hepatic function panel; Future -     CBC with Differential/Platelet; Future -     TSH; Future -     Urinalysis; Future  Dyslipidemia -  Lipid panel; Future -     TSH; Future  Smell disturbance   I have discontinued Ms. Bors's desonide. I am also having her maintain her metronidazole, multivitamin with minerals, Glucosamine Sulfate, cholecalciferol, Omega-3 Fatty Acids (FISH OIL CONCENTRATE PO), and acetaminophen.  Meds ordered this encounter  Medications  . acetaminophen (TYLENOL) 500 MG tablet    Sig: Take 500 mg by mouth 1 day or 1 dose.     Follow-up: No Follow-up on file.  Walker Kehr, MD

## 2015-02-05 NOTE — Patient Instructions (Addendum)
Caroline Olson , Thank you for taking time to come for your Medicare Wellness Visit. I appreciate your ongoing commitment to your health goals. Please review the following plan we discussed and let me know if I can assist you in the future.   Will call and leave Manuela Schwartz a message when flu shot was taken; [phone] Will start weight exercises 2 times a week Continue to walk  Calcium 1200 recommended and can review foods with calcium  Eye exam schedule next week;   These are the goals we discussed: Goals    . Exercise 150 minutes per week (moderate activity)     Will add low weight bearing exercises 2 times a week        This is a list of the screening recommended for you and due dates:  Health Maintenance  Topic Date Due  . Flu Shot  10/30/2014  . Colon Cancer Screening  09/10/2017  . Tetanus Vaccine  07/01/2023  . DEXA scan (bone density measurement)  Completed  . Shingles Vaccine  Completed  . Pneumonia vaccines  Completed     Health Maintenance, Female Adopting a healthy lifestyle and getting preventive care can go a long way to promote health and wellness. Talk with your health care provider about what schedule of regular examinations is right for you. This is a good chance for you to check in with your provider about disease prevention and staying healthy. In between checkups, there are plenty of things you can do on your own. Experts have done a lot of research about which lifestyle changes and preventive measures are most likely to keep you healthy. Ask your health care provider for more information. WEIGHT AND DIET  Eat a healthy diet  Be sure to include plenty of vegetables, fruits, low-fat dairy products, and lean protein.  Do not eat a lot of foods high in solid fats, added sugars, or salt.  Get regular exercise. This is one of the most important things you can do for your health.  Most adults should exercise for at least 150 minutes each week. The exercise should  increase your heart rate and make you sweat (moderate-intensity exercise).  Most adults should also do strengthening exercises at least twice a week. This is in addition to the moderate-intensity exercise.  Maintain a healthy weight  Body mass index (BMI) is a measurement that can be used to identify possible weight problems. It estimates body fat based on height and weight. Your health care provider can help determine your BMI and help you achieve or maintain a healthy weight.  For females 34 years of age and older:   A BMI below 18.5 is considered underweight.  A BMI of 18.5 to 24.9 is normal.  A BMI of 25 to 29.9 is considered overweight.  A BMI of 30 and above is considered obese.  Watch levels of cholesterol and blood lipids  You should start having your blood tested for lipids and cholesterol at 76 years of age, then have this test every 5 years.  You may need to have your cholesterol levels checked more often if:  Your lipid or cholesterol levels are high.  You are older than 76 years of age.  You are at high risk for heart disease.  CANCER SCREENING   Lung Cancer  Lung cancer screening is recommended for adults 97-39 years old who are at high risk for lung cancer because of a history of smoking.  A yearly low-dose CT scan  of the lungs is recommended for people who:  Currently smoke.  Have quit within the past 15 years.  Have at least a 30-pack-year history of smoking. A pack year is smoking an average of one pack of cigarettes a day for 1 year.  Yearly screening should continue until it has been 15 years since you quit.  Yearly screening should stop if you develop a health problem that would prevent you from having lung cancer treatment.  Breast Cancer  Practice breast self-awareness. This means understanding how your breasts normally appear and feel.  It also means doing regular breast self-exams. Let your health care provider know about any changes, no  matter how small.  If you are in your 20s or 30s, you should have a clinical breast exam (CBE) by a health care provider every 1-3 years as part of a regular health exam.  If you are 5 or older, have a CBE every year. Also consider having a breast X-ray (mammogram) every year.  If you have a family history of breast cancer, talk to your health care provider about genetic screening.  If you are at high risk for breast cancer, talk to your health care provider about having an MRI and a mammogram every year.  Breast cancer gene (BRCA) assessment is recommended for women who have family members with BRCA-related cancers. BRCA-related cancers include:  Breast.  Ovarian.  Tubal.  Peritoneal cancers.  Results of the assessment will determine the need for genetic counseling and BRCA1 and BRCA2 testing. Cervical Cancer Your health care provider may recommend that you be screened regularly for cancer of the pelvic organs (ovaries, uterus, and vagina). This screening involves a pelvic examination, including checking for microscopic changes to the surface of your cervix (Pap test). You may be encouraged to have this screening done every 3 years, beginning at age 34.  For women ages 82-65, health care providers may recommend pelvic exams and Pap testing every 3 years, or they may recommend the Pap and pelvic exam, combined with testing for human papilloma virus (HPV), every 5 years. Some types of HPV increase your risk of cervical cancer. Testing for HPV may also be done on women of any age with unclear Pap test results.  Other health care providers may not recommend any screening for nonpregnant women who are considered low risk for pelvic cancer and who do not have symptoms. Ask your health care provider if a screening pelvic exam is right for you.  If you have had past treatment for cervical cancer or a condition that could lead to cancer, you need Pap tests and screening for cancer for at least  20 years after your treatment. If Pap tests have been discontinued, your risk factors (such as having a new sexual partner) need to be reassessed to determine if screening should resume. Some women have medical problems that increase the chance of getting cervical cancer. In these cases, your health care provider may recommend more frequent screening and Pap tests. Colorectal Cancer  This type of cancer can be detected and often prevented.  Routine colorectal cancer screening usually begins at 76 years of age and continues through 76 years of age.  Your health care provider may recommend screening at an earlier age if you have risk factors for colon cancer.  Your health care provider may also recommend using home test kits to check for hidden blood in the stool.  A small camera at the end of a tube can be used to  examine your colon directly (sigmoidoscopy or colonoscopy). This is done to check for the earliest forms of colorectal cancer.  Routine screening usually begins at age 47.  Direct examination of the colon should be repeated every 5-10 years through 76 years of age. However, you may need to be screened more often if early forms of precancerous polyps or small growths are found. Skin Cancer  Check your skin from head to toe regularly.  Tell your health care provider about any new moles or changes in moles, especially if there is a change in a mole's shape or color.  Also tell your health care provider if you have a mole that is larger than the size of a pencil eraser.  Always use sunscreen. Apply sunscreen liberally and repeatedly throughout the day.  Protect yourself by wearing long sleeves, pants, a wide-brimmed hat, and sunglasses whenever you are outside. HEART DISEASE, DIABETES, AND HIGH BLOOD PRESSURE   High blood pressure causes heart disease and increases the risk of stroke. High blood pressure is more likely to develop in:  People who have blood pressure in the high end  of the normal range (130-139/85-89 mm Hg).  People who are overweight or obese.  People who are African American.  If you are 62-28 years of age, have your blood pressure checked every 3-5 years. If you are 52 years of age or older, have your blood pressure checked every year. You should have your blood pressure measured twice--once when you are at a hospital or clinic, and once when you are not at a hospital or clinic. Record the average of the two measurements. To check your blood pressure when you are not at a hospital or clinic, you can use:  An automated blood pressure machine at a pharmacy.  A home blood pressure monitor.  If you are between 18 years and 22 years old, ask your health care provider if you should take aspirin to prevent strokes.  Have regular diabetes screenings. This involves taking a blood sample to check your fasting blood sugar level.  If you are at a normal weight and have a low risk for diabetes, have this test once every three years after 76 years of age.  If you are overweight and have a high risk for diabetes, consider being tested at a younger age or more often. PREVENTING INFECTION  Hepatitis B  If you have a higher risk for hepatitis B, you should be screened for this virus. You are considered at high risk for hepatitis B if:  You were born in a country where hepatitis B is common. Ask your health care provider which countries are considered high risk.  Your parents were born in a high-risk country, and you have not been immunized against hepatitis B (hepatitis B vaccine).  You have HIV or AIDS.  You use needles to inject street drugs.  You live with someone who has hepatitis B.  You have had sex with someone who has hepatitis B.  You get hemodialysis treatment.  You take certain medicines for conditions, including cancer, organ transplantation, and autoimmune conditions. Hepatitis C  Blood testing is recommended for:  Everyone born from  [birth year] through 1967.  Anyone with known risk factors for hepatitis C. Sexually transmitted infections (STIs)  You should be screened for sexually transmitted infections (STIs) including gonorrhea and chlamydia if:  You are sexually active and are younger than 76 years of age.  You are older than 76 years of age and your health  care provider tells you that you are at risk for this type of infection.  Your sexual activity has changed since you were last screened and you are at an increased risk for chlamydia or gonorrhea. Ask your health care provider if you are at risk.  If you do not have HIV, but are at risk, it may be recommended that you take a prescription medicine daily to prevent HIV infection. This is called pre-exposure prophylaxis (PrEP). You are considered at risk if:  You are sexually active and do not regularly use condoms or know the HIV status of your partner(s).  You take drugs by injection.  You are sexually active with a partner who has HIV. Talk with your health care provider about whether you are at high risk of being infected with HIV. If you choose to begin PrEP, you should first be tested for HIV. You should then be tested every 3 months for as long as you are taking PrEP.  PREGNANCY   If you are premenopausal and you may become pregnant, ask your health care provider about preconception counseling.  If you may become pregnant, take 400 to 800 micrograms (mcg) of folic acid every day.  If you want to prevent pregnancy, talk to your health care provider about birth control (contraception). OSTEOPOROSIS AND MENOPAUSE   Osteoporosis is a disease in which the bones lose minerals and strength with aging. This can result in serious bone fractures. Your risk for osteoporosis can be identified using a bone density scan.  If you are 28 years of age or older, or if you are at risk for osteoporosis and fractures, ask your health care provider if you should be screened.  Ask  your health care provider whether you should take a calcium or vitamin D supplement to lower your risk for osteoporosis.  Menopause may have certain physical symptoms and risks.  Hormone replacement therapy may reduce some of these symptoms and risks. Talk to your health care provider about whether hormone replacement therapy is right for you.  HOME CARE INSTRUCTIONS   Schedule regular health, dental, and eye exams.  Stay current with your immunizations.   Do not use any tobacco products including cigarettes, chewing tobacco, or electronic cigarettes.  If you are pregnant, do not drink alcohol.  If you are breastfeeding, limit how much and how often you drink alcohol.  Limit alcohol intake to no more than 1 drink per day for nonpregnant women. One drink equals 12 ounces of beer, 5 ounces of wine, or 1 ounces of hard liquor.  Do not use street drugs.  Do not share needles.  Ask your health care provider for help if you need support or information about quitting drugs.  Tell your health care provider if you often feel depressed.  Tell your health care provider if you have ever been abused or do not feel safe at home.   This information is not intended to replace advice given to you by your health care provider. Make sure you discuss any questions you have with your health care provider.   Document Released: 09/30/2010 Document Revised: 04/07/2014 Document Reviewed: 02/16/2013 Elsevier Interactive Patient Education 2016 [place] for Adults, Female A healthy lifestyle and preventive care can promote health and wellness. Preventive health guidelines for women include the following key practices.  A routine yearly physical is a good way to check with your health care provider about your health and preventive screening. It is a chance to share any  concerns and updates on your health and to receive a thorough exam.  Visit your dentist for a routine exam and  preventive care every 6 months. Brush your teeth twice a day and floss once a day. Good oral hygiene prevents tooth decay and gum disease.  The frequency of eye exams is based on your age, health, family medical history, use of contact lenses, and other factors. Follow your health care provider's recommendations for frequency of eye exams.  Eat a healthy diet. Foods like vegetables, fruits, whole grains, low-fat dairy products, and lean protein foods contain the nutrients you need without too many calories. Decrease your intake of foods high in solid fats, added sugars, and salt. Eat the right amount of calories for you.Get information about a proper diet from your health care provider, if necessary.  Regular physical exercise is one of the most important things you can do for your health. Most adults should get at least 150 minutes of moderate-intensity exercise (any activity that increases your heart rate and causes you to sweat) each week. In addition, most adults need muscle-strengthening exercises on 2 or more days a week.  Maintain a healthy weight. The body mass index (BMI) is a screening tool to identify possible weight problems. It provides an estimate of body fat based on height and weight. Your health care provider can find your BMI and can help you achieve or maintain a healthy weight.For adults 20 years and older:  A BMI below 18.5 is considered underweight.  A BMI of 18.5 to 24.9 is normal.  A BMI of 25 to 29.9 is considered overweight.  A BMI of 30 and above is considered obese.  Maintain normal blood lipids and cholesterol levels by exercising and minimizing your intake of saturated fat. Eat a balanced diet with plenty of fruit and vegetables. Blood tests for lipids and cholesterol should begin at age 25 and be repeated every 5 years. If your lipid or cholesterol levels are high, you are over 50, or you are at high risk for heart disease, you may need your cholesterol levels  checked more frequently.Ongoing high lipid and cholesterol levels should be treated with medicines if diet and exercise are not working.  If you smoke, find out from your health care provider how to quit. If you do not use tobacco, do not start.  Lung cancer screening is recommended for adults aged 28-80 years who are at high risk for developing lung cancer because of a history of smoking. A yearly low-dose CT scan of the lungs is recommended for people who have at least a 30-pack-year history of smoking and are a current smoker or have quit within the past 15 years. A pack year of smoking is smoking an average of 1 pack of cigarettes a day for 1 year (for example: 1 pack a day for 30 years or 2 packs a day for 15 years). Yearly screening should continue until the smoker has stopped smoking for at least 15 years. Yearly screening should be stopped for people who develop a health problem that would prevent them from having lung cancer treatment.  If you are pregnant, do not drink alcohol. If you are breastfeeding, be very cautious about drinking alcohol. If you are not pregnant and choose to drink alcohol, do not have more than 1 drink per day. One drink is considered to be 12 ounces (355 mL) of beer, 5 ounces (148 mL) of wine, or 1.5 ounces (44 mL) of liquor.  Avoid use  of street drugs. Do not share needles with anyone. Ask for help if you need support or instructions about stopping the use of drugs.  High blood pressure causes heart disease and increases the risk of stroke. Your blood pressure should be checked at least every 1 to 2 years. Ongoing high blood pressure should be treated with medicines if weight loss and exercise do not work.  If you are 53-15 years old, ask your health care provider if you should take aspirin to prevent strokes.  Diabetes screening is done by taking a blood sample to check your blood glucose level after you have not eaten for a certain period of time (fasting). If you  are not overweight and you do not have risk factors for diabetes, you should be screened once every 3 years starting at age 98. If you are overweight or obese and you are 89-33 years of age, you should be screened for diabetes every year as part of your cardiovascular risk assessment.  Breast cancer screening is essential preventive care for women. You should practice "breast self-awareness." This means understanding the normal appearance and feel of your breasts and may include breast self-examination. Any changes detected, no matter how small, should be reported to a health care provider. Women in their 14s and 30s should have a clinical breast exam (CBE) by a health care provider as part of a regular health exam every 1 to 3 years. After age 1, women should have a CBE every year. Starting at age 41, women should consider having a mammogram (breast X-ray test) every year. Women who have a family history of breast cancer should talk to their health care provider about genetic screening. Women at a high risk of breast cancer should talk to their health care providers about having an MRI and a mammogram every year.  Breast cancer gene (BRCA)-related cancer risk assessment is recommended for women who have family members with BRCA-related cancers. BRCA-related cancers include breast, ovarian, tubal, and peritoneal cancers. Having family members with these cancers may be associated with an increased risk for harmful changes (mutations) in the breast cancer genes BRCA1 and BRCA2. Results of the assessment will determine the need for genetic counseling and BRCA1 and BRCA2 testing.  Your health care provider may recommend that you be screened regularly for cancer of the pelvic organs (ovaries, uterus, and vagina). This screening involves a pelvic examination, including checking for microscopic changes to the surface of your cervix (Pap test). You may be encouraged to have this screening done every 3 years,  beginning at age 59.  For women ages 73-65, health care providers may recommend pelvic exams and Pap testing every 3 years, or they may recommend the Pap and pelvic exam, combined with testing for human papilloma virus (HPV), every 5 years. Some types of HPV increase your risk of cervical cancer. Testing for HPV may also be done on women of any age with unclear Pap test results.  Other health care providers may not recommend any screening for nonpregnant women who are considered low risk for pelvic cancer and who do not have symptoms. Ask your health care provider if a screening pelvic exam is right for you.  If you have had past treatment for cervical cancer or a condition that could lead to cancer, you need Pap tests and screening for cancer for at least 20 years after your treatment. If Pap tests have been discontinued, your risk factors (such as having a new sexual partner) need to be  reassessed to determine if screening should resume. Some women have medical problems that increase the chance of getting cervical cancer. In these cases, your health care provider may recommend more frequent screening and Pap tests.  Colorectal cancer can be detected and often prevented. Most routine colorectal cancer screening begins at the age of 21 years and continues through age 78 years. However, your health care provider may recommend screening at an earlier age if you have risk factors for colon cancer. On a yearly basis, your health care provider may provide home test kits to check for hidden blood in the stool. Use of a small camera at the end of a tube, to directly examine the colon (sigmoidoscopy or colonoscopy), can detect the earliest forms of colorectal cancer. Talk to your health care provider about this at age 73, when routine screening begins. Direct exam of the colon should be repeated every 5-10 years through age 41 years, unless early forms of precancerous polyps or small growths are found.  People  who are at an increased risk for hepatitis B should be screened for this virus. You are considered at high risk for hepatitis B if:  You were born in a country where hepatitis B occurs often. Talk with your health care provider about which countries are considered high risk.  Your parents were born in a high-risk country and you have not received a shot to protect against hepatitis B (hepatitis B vaccine).  You have HIV or AIDS.  You use needles to inject street drugs.  You live with, or have sex with, someone who has hepatitis B.  You get hemodialysis treatment.  You take certain medicines for conditions like cancer, organ transplantation, and autoimmune conditions.  Hepatitis C blood testing is recommended for all people born from [birth year] through 1967 and any individual with known risks for hepatitis C.  Practice safe sex. Use condoms and avoid high-risk sexual practices to reduce the spread of sexually transmitted infections (STIs). STIs include gonorrhea, chlamydia, syphilis, trichomonas, herpes, HPV, and human immunodeficiency virus (HIV). Herpes, HIV, and HPV are viral illnesses that have no cure. They can result in disability, cancer, and death.  You should be screened for sexually transmitted illnesses (STIs) including gonorrhea and chlamydia if:  You are sexually active and are younger than 24 years.  You are older than 24 years and your health care provider tells you that you are at risk for this type of infection.  Your sexual activity has changed since you were last screened and you are at an increased risk for chlamydia or gonorrhea. Ask your health care provider if you are at risk.  If you are at risk of being infected with HIV, it is recommended that you take a prescription medicine daily to prevent HIV infection. This is called preexposure prophylaxis (PrEP). You are considered at risk if:  You are sexually active and do not regularly use condoms or know the HIV status of  your partner(s).  You take drugs by injection.  You are sexually active with a partner who has HIV.  Talk with your health care provider about whether you are at high risk of being infected with HIV. If you choose to begin PrEP, you should first be tested for HIV. You should then be tested every 3 months for as long as you are taking PrEP.  Osteoporosis is a disease in which the bones lose minerals and strength with aging. This can result in serious bone fractures or breaks. The risk  of osteoporosis can be identified using a bone density scan. Women ages 95 years and over and women at risk for fractures or osteoporosis should discuss screening with their health care providers. Ask your health care provider whether you should take a calcium supplement or vitamin D to reduce the rate of osteoporosis.  Menopause can be associated with physical symptoms and risks. Hormone replacement therapy is available to decrease symptoms and risks. You should talk to your health care provider about whether hormone replacement therapy is right for you.  Use sunscreen. Apply sunscreen liberally and repeatedly throughout the day. You should seek shade when your shadow is shorter than you. Protect yourself by wearing long sleeves, pants, a wide-brimmed hat, and sunglasses year round, whenever you are outdoors.  Once a month, do a whole body skin exam, using a mirror to look at the skin on your back. Tell your health care provider of new moles, moles that have irregular borders, moles that are larger than a pencil eraser, or moles that have changed in shape or color.  Stay current with required vaccines (immunizations).  Influenza vaccine. All adults should be immunized every year.  Tetanus, diphtheria, and acellular pertussis (Td, Tdap) vaccine. Pregnant women should receive 1 dose of Tdap vaccine during each pregnancy. The dose should be obtained regardless of the length of time since the last dose. Immunization is  preferred during the 27th-36th week of gestation. An adult who has not previously received Tdap or who does not know her vaccine status should receive 1 dose of Tdap. This initial dose should be followed by tetanus and diphtheria toxoids (Td) booster doses every 10 years. Adults with an unknown or incomplete history of completing a 3-dose immunization series with Td-containing vaccines should begin or complete a primary immunization series including a Tdap dose. Adults should receive a Td booster every 10 years.  Varicella vaccine. An adult without evidence of immunity to varicella should receive 2 doses or a second dose if she has previously received 1 dose. Pregnant females who do not have evidence of immunity should receive the first dose after pregnancy. This first dose should be obtained before leaving the health care facility. The second dose should be obtained 4-8 weeks after the first dose.  Human papillomavirus (HPV) vaccine. Females aged 13-26 years who have not received the vaccine previously should obtain the 3-dose series. The vaccine is not recommended for use in pregnant females. However, pregnancy testing is not needed before receiving a dose. If a female is found to be pregnant after receiving a dose, no treatment is needed. In that case, the remaining doses should be delayed until after the pregnancy. Immunization is recommended for any person with an immunocompromised condition through the age of 105 years if she did not get any or all doses earlier. During the 3-dose series, the second dose should be obtained 4-8 weeks after the first dose. The third dose should be obtained 24 weeks after the first dose and 16 weeks after the second dose.  Zoster vaccine. One dose is recommended for adults aged 68 years or older unless certain conditions are present.  Measles, mumps, and rubella (MMR) vaccine. Adults born before [birth year] generally are considered immune to measles and mumps. Adults born in [birth year]  or later should have 1 or more doses of MMR vaccine unless there is a contraindication to the vaccine or there is laboratory evidence of immunity to each of the three diseases. A routine second dose of MMR vaccine should  be obtained at least 28 days after the first dose for students attending postsecondary schools, health care workers, or international travelers. People who received inactivated measles vaccine or an unknown type of measles vaccine during 1963-1967 should receive 2 doses of MMR vaccine. People who received inactivated mumps vaccine or an unknown type of mumps vaccine before 1979 and are at high risk for mumps infection should consider immunization with 2 doses of MMR vaccine. For females of childbearing age, rubella immunity should be determined. If there is no evidence of immunity, females who are not pregnant should be vaccinated. If there is no evidence of immunity, females who are pregnant should delay immunization until after pregnancy. Unvaccinated health care workers born before [birth year] who lack laboratory evidence of measles, mumps, or rubella immunity or laboratory confirmation of disease should consider measles and mumps immunization with 2 doses of MMR vaccine or rubella immunization with 1 dose of MMR vaccine.  Pneumococcal 13-valent conjugate (PCV13) vaccine. When indicated, a person who is uncertain of his immunization history and has no record of immunization should receive the PCV13 vaccine. All adults 40 years of age and older should receive this vaccine. An adult aged 70 years or older who has certain medical conditions and has not been previously immunized should receive 1 dose of PCV13 vaccine. This PCV13 should be followed with a dose of pneumococcal polysaccharide (PPSV23) vaccine. Adults who are at high risk for pneumococcal disease should obtain the PPSV23 vaccine at least 8 weeks after the dose of PCV13 vaccine. Adults older than 76 years of age who have normal immune system  function should obtain the PPSV23 vaccine dose at least 1 year after the dose of PCV13 vaccine.  Pneumococcal polysaccharide (PPSV23) vaccine. When PCV13 is also indicated, PCV13 should be obtained first. All adults aged 92 years and older should be immunized. An adult younger than age 16 years who has certain medical conditions should be immunized. Any person who resides in a nursing home or long-term care facility should be immunized. An adult smoker should be immunized. People with an immunocompromised condition and certain other conditions should receive both PCV13 and PPSV23 vaccines. People with human immunodeficiency virus (HIV) infection should be immunized as soon as possible after diagnosis. Immunization during chemotherapy or radiation therapy should be avoided. Routine use of PPSV23 vaccine is not recommended for American Indians, [place] Natives, or people younger than 65 years unless there are medical conditions that require PPSV23 vaccine. When indicated, people who have unknown immunization and have no record of immunization should receive PPSV23 vaccine. One-time revaccination 5 years after the first dose of PPSV23 is recommended for people aged 19-64 years who have chronic kidney failure, nephrotic syndrome, asplenia, or immunocompromised conditions. People who received 1-2 doses of PPSV23 before age 39 years should receive another dose of PPSV23 vaccine at age 40 years or later if at least 5 years have passed since the previous dose. Doses of PPSV23 are not needed for people immunized with PPSV23 at or after age 71 years.  Meningococcal vaccine. Adults with asplenia or persistent complement component deficiencies should receive 2 doses of quadrivalent meningococcal conjugate (MenACWY-D) vaccine. The doses should be obtained at least 2 months apart. Microbiologists working with certain meningococcal bacteria, [place] recruits, people at risk during an outbreak, and people who travel to or live  in countries with a high rate of meningitis should be immunized. A first-year college student up through age 33 years who is living in a residence hall should  receive a dose if she did not receive a dose on or after her 16th birthday. Adults who have certain high-risk conditions should receive one or more doses of vaccine.  Hepatitis A vaccine. Adults who wish to be protected from this disease, have certain high-risk conditions, work with hepatitis A-infected animals, work in hepatitis A research labs, or travel to or work in countries with a high rate of hepatitis A should be immunized. Adults who were previously unvaccinated and who anticipate close contact with an international adoptee during the first 60 days after arrival in the [place] from a country with a high rate of hepatitis A should be immunized.  Hepatitis B vaccine. Adults who wish to be protected from this disease, have certain high-risk conditions, may be exposed to blood or other infectious body fluids, are household contacts or sex partners of hepatitis B positive people, are clients or workers in certain care facilities, or travel to or work in countries with a high rate of hepatitis B should be immunized.  Haemophilus influenzae type b (Hib) vaccine. A previously unvaccinated person with asplenia or sickle cell disease or having a scheduled splenectomy should receive 1 dose of Hib vaccine. Regardless of previous immunization, a recipient of a hematopoietic stem cell transplant should receive a 3-dose series 6-12 months after her successful transplant. Hib vaccine is not recommended for adults with HIV infection. Preventive Services / Frequency Ages 30 to 44 years  Blood pressure check.** / Every 3-5 years.  Lipid and cholesterol check.** / Every 5 years beginning at age 59.  Clinical breast exam.** / Every 3 years for women in their 65s and 30s.  BRCA-related cancer risk assessment.** / For women who have family members with  a BRCA-related cancer (breast, ovarian, tubal, or peritoneal cancers).  Pap test.** / Every 2 years from ages 59 through 7. Every 3 years starting at age 13 through age 45 or 50 with a history of 3 consecutive normal Pap tests.  HPV screening.** / Every 3 years from ages 96 through ages 61 to 53 with a history of 3 consecutive normal Pap tests.  Hepatitis C blood test.** / For any individual with known risks for hepatitis C.  Skin self-exam. / Monthly.  Influenza vaccine. / Every year.  Tetanus, diphtheria, and acellular pertussis (Tdap, Td) vaccine.** / Consult your health care provider. Pregnant women should receive 1 dose of Tdap vaccine during each pregnancy. 1 dose of Td every 10 years.  Varicella vaccine.** / Consult your health care provider. Pregnant females who do not have evidence of immunity should receive the first dose after pregnancy.  HPV vaccine. / 3 doses over 6 months, if 44 and younger. The vaccine is not recommended for use in pregnant females. However, pregnancy testing is not needed before receiving a dose.  Measles, mumps, rubella (MMR) vaccine.** / You need at least 1 dose of MMR if you were born in 1957 or later. You may also need a 2nd dose. For females of childbearing age, rubella immunity should be determined. If there is no evidence of immunity, females who are not pregnant should be vaccinated. If there is no evidence of immunity, females who are pregnant should delay immunization until after pregnancy.  Pneumococcal 13-valent conjugate (PCV13) vaccine.** / Consult your health care provider.  Pneumococcal polysaccharide (PPSV23) vaccine.** / 1 to 2 doses if you smoke cigarettes or if you have certain conditions.  Meningococcal vaccine.** / 1 dose if you are age 49 to 37 years and  a [employer] researcher living in a residence hall, or have one of several medical conditions, you need to get vaccinated against meningococcal disease. You may also need  additional booster doses.  Hepatitis A vaccine.** / Consult your health care provider.  Hepatitis B vaccine.** / Consult your health care provider.  Haemophilus influenzae type b (Hib) vaccine.** / Consult your health care provider. Ages 62 to 20 years  Blood pressure check.** / Every year.  Lipid and cholesterol check.** / Every 5 years beginning at age 84 years.  Lung cancer screening. / Every year if you are aged 55-80 years and have a 30-pack-year history of smoking and currently smoke or have quit within the past 15 years. Yearly screening is stopped once you have quit smoking for at least 15 years or develop a health problem that would prevent you from having lung cancer treatment.  Clinical breast exam.** / Every year after age 80 years.  BRCA-related cancer risk assessment.** / For women who have family members with a BRCA-related cancer (breast, ovarian, tubal, or peritoneal cancers).  Mammogram.** / Every year beginning at age 56 years and continuing for as long as you are in good health. Consult with your health care provider.  Pap test.** / Every 3 years starting at age 18 years through age 91 or 68 years with a history of 3 consecutive normal Pap tests.  HPV screening.** / Every 3 years from ages 34 years through ages 8 to 39 years with a history of 3 consecutive normal Pap tests.  Fecal occult blood test (FOBT) of stool. / Every year beginning at age 73 years and continuing until age 2 years. You may not need to do this test if you get a colonoscopy every 10 years.  Flexible sigmoidoscopy or colonoscopy.** / Every 5 years for a flexible sigmoidoscopy or every 10 years for a colonoscopy beginning at age 104 years and continuing until age 68 years.  Hepatitis C blood test.** / For all people born from [birth year] through 1965 and any individual with known risks for hepatitis C.  Skin self-exam. / Monthly.  Influenza vaccine. / Every year.  Tetanus, diphtheria, and acellular  pertussis (Tdap/Td) vaccine.** / Consult your health care provider. Pregnant women should receive 1 dose of Tdap vaccine during each pregnancy. 1 dose of Td every 10 years.  Varicella vaccine.** / Consult your health care provider. Pregnant females who do not have evidence of immunity should receive the first dose after pregnancy.  Zoster vaccine.** / 1 dose for adults aged 22 years or older.  Measles, mumps, rubella (MMR) vaccine.** / You need at least 1 dose of MMR if you were born in 1957 or later. You may also need a second dose. For females of childbearing age, rubella immunity should be determined. If there is no evidence of immunity, females who are not pregnant should be vaccinated. If there is no evidence of immunity, females who are pregnant should delay immunization until after pregnancy.  Pneumococcal 13-valent conjugate (PCV13) vaccine.** / Consult your health care provider.  Pneumococcal polysaccharide (PPSV23) vaccine.** / 1 to 2 doses if you smoke cigarettes or if you have certain conditions.  Meningococcal vaccine.** / Consult your health care provider.  Hepatitis A vaccine.** / Consult your health care provider.  Hepatitis B vaccine.** / Consult your health care provider.  Haemophilus influenzae type b (Hib) vaccine.** / Consult your health care provider. Ages 83 years and over  Blood pressure check.** / Every year.  Lipid and cholesterol  check.** / Every 5 years beginning at age 80 years.  Lung cancer screening. / Every year if you are aged 37-80 years and have a 30-pack-year history of smoking and currently smoke or have quit within the past 15 years. Yearly screening is stopped once you have quit smoking for at least 15 years or develop a health problem that would prevent you from having lung cancer treatment.  Clinical breast exam.** / Every year after age 43 years.  BRCA-related cancer risk assessment.** / For women who have family members with a BRCA-related  cancer (breast, ovarian, tubal, or peritoneal cancers).  Mammogram.** / Every year beginning at age 29 years and continuing for as long as you are in good health. Consult with your health care provider.  Pap test.** / Every 3 years starting at age 110 years through age 87 or 63 years with 3 consecutive normal Pap tests. Testing can be stopped between 65 and 70 years with 3 consecutive normal Pap tests and no abnormal Pap or HPV tests in the past 10 years.  HPV screening.** / Every 3 years from ages 67 years through ages 40 or 34 years with a history of 3 consecutive normal Pap tests. Testing can be stopped between 65 and 70 years with 3 consecutive normal Pap tests and no abnormal Pap or HPV tests in the past 10 years.  Fecal occult blood test (FOBT) of stool. / Every year beginning at age 27 years and continuing until age 75 years. You may not need to do this test if you get a colonoscopy every 10 years.  Flexible sigmoidoscopy or colonoscopy.** / Every 5 years for a flexible sigmoidoscopy or every 10 years for a colonoscopy beginning at age 25 years and continuing until age 87 years.  Hepatitis C blood test.** / For all people born from [birth year] through 1965 and any individual with known risks for hepatitis C.  Osteoporosis screening.** / A one-time screening for women ages 38 years and over and women at risk for fractures or osteoporosis.  Skin self-exam. / Monthly.  Influenza vaccine. / Every year.  Tetanus, diphtheria, and acellular pertussis (Tdap/Td) vaccine.** / 1 dose of Td every 10 years.  Varicella vaccine.** / Consult your health care provider.  Zoster vaccine.** / 1 dose for adults aged 54 years or older.  Pneumococcal 13-valent conjugate (PCV13) vaccine.** / Consult your health care provider.  Pneumococcal polysaccharide (PPSV23) vaccine.** / 1 dose for all adults aged 70 years and older.  Meningococcal vaccine.** / Consult your health care provider.  Hepatitis A vaccine.** /  Consult your health care provider.  Hepatitis B vaccine.** / Consult your health care provider.  Haemophilus influenzae type b (Hib) vaccine.** / Consult your health care provider. ** Family history and personal history of risk and conditions may change your health care provider's recommendations.   This information is not intended to replace advice given to you by your health care provider. Make sure you discuss any questions you have with your health care provider.   Document Released: 05/13/2001 Document Revised: 04/07/2014 Document Reviewed: 08/12/2010 Elsevier Interactive Patient Education [employer].

## 2015-02-05 NOTE — Assessment & Plan Note (Signed)
Chronic Nl BP at home

## 2015-02-05 NOTE — Assessment & Plan Note (Signed)
Here for medicare wellness/physical  Diet: heart healthy  Physical activity: not sedentary  Depression/mood screen: negative  Hearing: intact to whispered voice  Visual acuity: grossly normal, performs annual eye exam  ADLs: capable  Fall risk: none  Home safety: good  Cognitive evaluation: intact to orientation, naming, recall and repetition  EOL planning: adv directives, full code/ I agree  I have personally reviewed and have noted  1. The patient's medical and social history  2. Their use of alcohol, tobacco or illicit drugs  3. Their current medications and supplements  4. The patient's functional ability including ADL's, fall risks, home safety risks and hearing or visual impairment.  5. Diet and physical activities  6. Evidence for depression or mood disorders 7. Roster of providers is reviewed    Today patient counseled on age appropriate routine health concerns for screening and prevention, each reviewed and up to date or declined. Immunizations reviewed and up to date or declined. Labs ordered and reviewed. Risk factors for depression reviewed and negative. Hearing function and visual acuity are intact. ADLs screened and addressed as needed. Functional ability and level of safety reviewed and appropriate. Education, counseling and referrals performed based on assessed risks today. Patient provided with a copy of personalized plan for preventive services.

## 2015-02-05 NOTE — Assessment & Plan Note (Signed)
11/16 "strange smell" at times at times since summer ?etiol ENT ref if not resolved

## 2015-02-05 NOTE — Assessment & Plan Note (Signed)
Dr Jana Hakim: status post left breast lumpectomy for ductal carcinoma in situ in 2011.  Dr Sabra Heck and a mammo q 12 mo

## 2015-02-14 ENCOUNTER — Other Ambulatory Visit (INDEPENDENT_AMBULATORY_CARE_PROVIDER_SITE_OTHER): Payer: Medicare Other

## 2015-02-14 DIAGNOSIS — Z0000 Encounter for general adult medical examination without abnormal findings: Secondary | ICD-10-CM

## 2015-02-14 DIAGNOSIS — Z86010 Personal history of colonic polyps: Secondary | ICD-10-CM

## 2015-02-14 DIAGNOSIS — C50412 Malignant neoplasm of upper-outer quadrant of left female breast: Secondary | ICD-10-CM

## 2015-02-14 DIAGNOSIS — IMO0001 Reserved for inherently not codable concepts without codable children: Secondary | ICD-10-CM

## 2015-02-14 DIAGNOSIS — E785 Hyperlipidemia, unspecified: Secondary | ICD-10-CM

## 2015-02-14 LAB — LIPID PANEL
Cholesterol: 198 mg/dL (ref 0–200)
HDL: 80.8 mg/dL (ref >39.00)
LDL CALC: 109 mg/dL — AB (ref 0–99)
NonHDL: 117.42
Total CHOL/HDL Ratio: 2
Triglycerides: 44 mg/dL (ref 0.0–149.0)
VLDL: 8.8 mg/dL (ref 0.0–40.0)

## 2015-02-14 LAB — CBC WITH DIFFERENTIAL/PLATELET
BASOS ABS: 0 10*3/uL (ref 0.0–0.1)
Basophils Relative: 0 % (ref 0.0–3.0)
EOS PCT: 1.2 % (ref 0.0–5.0)
Eosinophils Absolute: 0.1 10*3/uL (ref 0.0–0.7)
HCT: 36.5 % (ref 36.0–46.0)
Hemoglobin: 12.1 g/dL (ref 12.0–15.0)
LYMPHS PCT: 17.4 % (ref 12.0–46.0)
Lymphs Abs: 0.9 10*3/uL (ref 0.7–4.0)
MCHC: 33.2 g/dL (ref 30.0–36.0)
MCV: 86.8 fl (ref 78.0–100.0)
MONOS PCT: 7.6 % (ref 3.0–12.0)
Monocytes Absolute: 0.4 10*3/uL (ref 0.1–1.0)
Neutro Abs: 3.9 10*3/uL (ref 1.4–7.7)
Neutrophils Relative %: 73.8 % (ref 43.0–77.0)
Platelets: 247 10*3/uL (ref 150.0–400.0)
RBC: 4.2 Mil/uL (ref 3.87–5.11)
RDW: 13.6 % (ref 11.5–15.5)
WBC: 5.3 10*3/uL (ref 4.0–10.5)

## 2015-02-14 LAB — TSH: TSH: 0.98 u[IU]/mL (ref 0.35–4.50)

## 2015-02-14 LAB — URINALYSIS
Bilirubin Urine: NEGATIVE
Hgb urine dipstick: NEGATIVE
Ketones, ur: NEGATIVE
LEUKOCYTES UA: NEGATIVE
Nitrite: NEGATIVE
SPECIFIC GRAVITY, URINE: 1.02 (ref 1.000–1.030)
Total Protein, Urine: NEGATIVE
UROBILINOGEN UA: 0.2 (ref 0.0–1.0)
Urine Glucose: NEGATIVE
pH: 7 (ref 5.0–8.0)

## 2015-02-14 LAB — HEPATIC FUNCTION PANEL
ALBUMIN: 4.2 g/dL (ref 3.5–5.2)
ALT: 12 U/L (ref 0–35)
AST: 18 U/L (ref 0–37)
Alkaline Phosphatase: 74 U/L (ref 39–117)
Bilirubin, Direct: 0.1 mg/dL (ref 0.0–0.3)
TOTAL PROTEIN: 7.3 g/dL (ref 6.0–8.3)
Total Bilirubin: 0.6 mg/dL (ref 0.2–1.2)

## 2015-02-14 LAB — BASIC METABOLIC PANEL
BUN: 12 mg/dL (ref 6–23)
CO2: 29 meq/L (ref 19–32)
CREATININE: 0.62 mg/dL (ref 0.40–1.20)
Calcium: 9.8 mg/dL (ref 8.4–10.5)
Chloride: 102 mEq/L (ref 96–112)
GFR: 99.42 mL/min (ref >60.00)
Glucose, Bld: 90 mg/dL (ref 70–99)
Potassium: 4.2 mEq/L (ref 3.5–5.1)
Sodium: 139 mEq/L (ref 135–145)

## 2015-02-15 ENCOUNTER — Telehealth: Payer: Self-pay

## 2015-02-15 DIAGNOSIS — Z961 Presence of intraocular lens: Secondary | ICD-10-CM | POA: Diagnosis not present

## 2015-02-15 DIAGNOSIS — H353131 Nonexudative age-related macular degeneration, bilateral, early dry stage: Secondary | ICD-10-CM | POA: Diagnosis not present

## 2015-02-15 DIAGNOSIS — H04123 Dry eye syndrome of bilateral lacrimal glands: Secondary | ICD-10-CM | POA: Diagnosis not present

## 2015-02-15 DIAGNOSIS — L719 Rosacea, unspecified: Secondary | ICD-10-CM | POA: Diagnosis not present

## 2015-02-15 NOTE — Telephone Encounter (Signed)
Patient called to confirm [facility]  States her bp was elevated to 188/74 at health fair, but has not been that high prior; Agreed to [facility] nurse [facility] check her BP or other at least 3 times a week. If it remains elevated, she will need to fup with Dr. Alain Marion; Otherwise schedule nurse visit on 12/09 for recheck.  Any other recommendation; please advise.

## 2015-02-16 NOTE — Telephone Encounter (Signed)
Agree. She could also buy a BP monitor to check BP at home Thx

## 2015-03-06 ENCOUNTER — Ambulatory Visit (INDEPENDENT_AMBULATORY_CARE_PROVIDER_SITE_OTHER): Payer: Medicare Other | Admitting: Obstetrics & Gynecology

## 2015-03-06 ENCOUNTER — Telehealth: Payer: Self-pay | Admitting: Obstetrics & Gynecology

## 2015-03-06 ENCOUNTER — Encounter: Payer: Self-pay | Admitting: Obstetrics & Gynecology

## 2015-03-06 VITALS — BP 136/70 | HR 70 | Temp 98.3°F | Resp 16 | Ht 65.0 in | Wt 132.0 lb

## 2015-03-06 DIAGNOSIS — R300 Dysuria: Secondary | ICD-10-CM

## 2015-03-06 DIAGNOSIS — R3129 Other microscopic hematuria: Secondary | ICD-10-CM | POA: Diagnosis not present

## 2015-03-06 LAB — POCT URINALYSIS DIPSTICK
Bilirubin, UA: NEGATIVE
Glucose, UA: NEGATIVE
Ketones, UA: NEGATIVE
Nitrite, UA: NEGATIVE
Urobilinogen, UA: NEGATIVE
pH, UA: 5

## 2015-03-06 MED ORDER — SULFAMETHOXAZOLE-TRIMETHOPRIM 800-160 MG PO TABS: 1.0000 | ORAL_TABLET | Freq: Two times a day (BID) | ORAL | Status: DC

## 2015-03-06 MED ORDER — PHENAZOPYRIDINE HCL 100 MG PO TABS: 100.0000 mg | ORAL_TABLET | Freq: Three times a day (TID) | ORAL | Status: DC | PRN

## 2015-03-06 NOTE — Telephone Encounter (Signed)
Patient called and left a message during lunch. She said, "I am having urine frequency and burning. I have never had a urinary tract infection before so I need to know what to do." I called the patient back but had to leave a message to call back.

## 2015-03-06 NOTE — Telephone Encounter (Signed)
Scheduled 04/25/14

## 2015-03-06 NOTE — Progress Notes (Signed)
GYNECOLOGY  VISIT   HPI: 74 y.o.  Married G76P2 Caucasian female here for increased urinary urgency for 24-36 hours.  She noted a little pink in her urine during the past two days.  That has worsened today.  No back pain.  Pt reports she's never had a UTI so she wasn't sure what the symptoms were.  She denies vaginal bleeding.  Denies fever.  She woke up this morning and felt more pain with urination and now feels this is probably a UTI.  No vaginal bleeding.     GYNECOLOGIC HISTORY: Patient's last menstrual period was 04/01/1983.      Past Medical History  Diagnosis Date  . Breast cancer ([place]) 2011    DCIF  . Varicose veins   . Osteopenia    Review of Systems  Constitutional: Negative for fever, chills and malaise/fatigue.  Gastrointestinal: Negative for abdominal pain.  Genitourinary: Positive for dysuria, urgency, frequency and hematuria. Negative for flank pain.  Musculoskeletal: Negative for myalgias.    Physical Exam:    BP 136/70 mmHg  Pulse 70  Temp(Src) 98.3 F (36.8 C) (Oral)  Resp 16  Ht 5\' 5"  (1.651 m)  Wt 132 lb (59.875 kg)  BMI 21.97 kg/m2  LMP 04/01/1983     General appearance: WNWD, WF, NAD, alert, cooperative and appears stated age Abdomen: soft, non-tender; bowel sounds normal; no masses,  no organomegaly, no suprapubic tenderness Back:  No CVA tenderness Psych: Oriented x 3   Assessment: Probable UTI   Plan: Urine micro and culture pending Bactrim DS BID x 5 days Follow up will be scheduled pending results

## 2015-03-07 LAB — URINALYSIS, MICROSCOPIC ONLY
Casts: NONE SEEN [LPF]
Crystals: NONE SEEN [HPF]
SQUAMOUS EPITHELIAL / LPF: NONE SEEN [HPF] (ref <=5)
YEAST: NONE SEEN [HPF]

## 2015-03-09 ENCOUNTER — Telehealth: Payer: Self-pay

## 2015-03-09 ENCOUNTER — Other Ambulatory Visit: Payer: Self-pay

## 2015-03-09 ENCOUNTER — Ambulatory Visit: Payer: Medicare Other

## 2015-03-09 ENCOUNTER — Ambulatory Visit
Admission: RE | Admit: 2015-03-09 | Discharge: 2015-03-09 | Disposition: A | Discharge status: Home / Self Care | Payer: Medicare Other | Source: Ambulatory Visit | Attending: Obstetrics & Gynecology | Admitting: Obstetrics & Gynecology

## 2015-03-09 VITALS — BP 146/86

## 2015-03-09 DIAGNOSIS — Z853 Personal history of malignant neoplasm of breast: Secondary | ICD-10-CM

## 2015-03-09 DIAGNOSIS — R922 Inconclusive mammogram: Secondary | ICD-10-CM | POA: Diagnosis not present

## 2015-03-09 DIAGNOSIS — I10 Essential (primary) hypertension: Secondary | ICD-10-CM

## 2015-03-09 DIAGNOSIS — Z0130 Encounter for examination of blood pressure without abnormal findings: Secondary | ICD-10-CM

## 2015-03-09 DIAGNOSIS — Z9889 Other specified postprocedural states: Secondary | ICD-10-CM

## 2015-03-09 LAB — URINE CULTURE

## 2015-03-09 MED ORDER — LOSARTAN POTASSIUM 50 MG PO TABS: 50.0000 mg | ORAL_TABLET | Freq: Every day | ORAL | Status: DC

## 2015-03-09 NOTE — Progress Notes (Signed)
Pre visit review using our clinic review tool, if applicable. No additional management support is needed unless otherwise documented below in the visit note. 

## 2015-03-09 NOTE — Telephone Encounter (Signed)
Message to call Caroline Olson regarding her BP and starting Losartan 50mg  q day. BP  Vitals with BMI 03/09/2015 03/06/2015 02/05/2015 02/05/2015 [id number]  Systolic [id number]  Diastolic 86 70 80 80 94   Vitals with BMI 01/25/2014 08/04/2013 07/21/2013 06/30/2013 [id number]  Systolic 0000000 [id number]  Diastolic 86 74 76 80 74   LVM for call back;

## 2015-03-09 NOTE — Addendum Note (Signed)
Addended by: Megan Salon on: 03/09/2015 12:38 PM   Modules accepted: Orders, SmartSet

## 2015-03-09 NOTE — Telephone Encounter (Signed)
Patient came back in today to have BP rechecked due to Manuela Schwartz being concerned about it. Patients BP today was 146/86. Dr. Camila Li has decided to start her on Losartan 50mg  once daily and see her again in two to three months.

## 2015-03-09 NOTE — Telephone Encounter (Signed)
Per C.Heather Cox, the patient wanted to discuss prior to filling losartan ordered by MD. The patient called; see note below;   The patient agreed to Losartan; per order given to [employer]; Losartan 50mg  po q day ordered x 30 with 3 refills; to get fup apt 2 to 3 months; order sent to pharmacy. Will continue to check BP and call for an side effects.   Dr. Alain Marion; Just FYI. Tks

## 2015-03-09 NOTE — Telephone Encounter (Signed)
Call back to discuss BP and reading from the last 2 years; States she hasn't been able to walk and that she has eaten out more of late; Seen our earlier discussion regarding the DASH diet. Stated that BP can be somewhat controlled with diet and exercise; dec'ing sodium but that BP can be familial. In the later case, it is likely with her history she will need to take BP medicine.  Referred to pharmacist for any questions regarding the med; discussed purchase of cuff to keep up with BP at home.  Also had Mammogram today.  Ok to send med into [facility].

## 2015-03-12 ENCOUNTER — Telehealth: Payer: Self-pay

## 2015-03-12 NOTE — Telephone Encounter (Signed)
Spoke with patient. Results given as seen below from [facility]. Patient is agreeable and verbalizes understanding. TOC appointment scheduled for 03/21/2015 at 9:30 am. Agreeable to date and time.  Routing to provider for final review. Patient agreeable to disposition. Will close encounter.

## 2015-03-12 NOTE — Telephone Encounter (Signed)
-----   Message from Megan Salon, MD sent at 03/09/2015 12:37 PM EST ----- Please inform pt that urine culture showed e. Coli but this is sensitive to the antibiotic I started her on.  I do want to repeat her micro as there was a lot of blood in it.  Orders placed.

## 2015-03-21 ENCOUNTER — Other Ambulatory Visit: Payer: Self-pay

## 2015-03-21 ENCOUNTER — Ambulatory Visit (INDEPENDENT_AMBULATORY_CARE_PROVIDER_SITE_OTHER): Payer: Medicare Other

## 2015-03-21 VITALS — BP 122/68 | HR 60 | Resp 16 | Wt 131.0 lb

## 2015-03-21 DIAGNOSIS — D1801 Hemangioma of skin and subcutaneous tissue: Secondary | ICD-10-CM | POA: Diagnosis not present

## 2015-03-21 DIAGNOSIS — L821 Other seborrheic keratosis: Secondary | ICD-10-CM | POA: Diagnosis not present

## 2015-03-21 DIAGNOSIS — D2262 Melanocytic nevi of left upper limb, including shoulder: Secondary | ICD-10-CM | POA: Diagnosis not present

## 2015-03-21 DIAGNOSIS — R3129 Other microscopic hematuria: Secondary | ICD-10-CM | POA: Diagnosis not present

## 2015-03-21 DIAGNOSIS — L308 Other specified dermatitis: Secondary | ICD-10-CM | POA: Diagnosis not present

## 2015-03-21 DIAGNOSIS — L720 Epidermal cyst: Secondary | ICD-10-CM | POA: Diagnosis not present

## 2015-03-21 DIAGNOSIS — L570 Actinic keratosis: Secondary | ICD-10-CM | POA: Diagnosis not present

## 2015-03-21 DIAGNOSIS — D225 Melanocytic nevi of trunk: Secondary | ICD-10-CM | POA: Diagnosis not present

## 2015-03-21 DIAGNOSIS — B351 Tinea unguium: Secondary | ICD-10-CM | POA: Diagnosis not present

## 2015-03-21 DIAGNOSIS — D2239 Melanocytic nevi of other parts of face: Secondary | ICD-10-CM | POA: Diagnosis not present

## 2015-03-21 NOTE — Addendum Note (Signed)
Addended by: Graylon Good on: 03/21/2015 05:06 PM   Modules accepted: Orders

## 2015-03-21 NOTE — Progress Notes (Signed)
Patient here for Urine TOC.  She has completed the course of antibiotics. Specimen collected and sent to lab. She has no new concerns or complaints.//kg

## 2015-03-21 NOTE — Addendum Note (Signed)
Addended by: Graylon Good on: 03/21/2015 04:46 PM   Modules accepted: Orders, SmartSet

## 2015-03-21 NOTE — Addendum Note (Signed)
Addended by: Graylon Good on: 03/21/2015 05:27 PM   Modules accepted: Miquel Dunn

## 2015-03-22 LAB — URINALYSIS, MICROSCOPIC ONLY
Bacteria, UA: NONE SEEN [HPF]
CASTS: NONE SEEN [LPF]
CRYSTALS: NONE SEEN [HPF]
RBC / HPF: NONE SEEN RBC/HPF (ref <=2)
Squamous Epithelial / LPF: NONE SEEN [HPF] (ref <=5)
WBC UA: NONE SEEN WBC/HPF (ref <=5)
YEAST: NONE SEEN [HPF]

## 2015-03-30 ENCOUNTER — Telehealth: Payer: Self-pay

## 2015-03-30 NOTE — Telephone Encounter (Signed)
Noted apt for nov 2017/ outreached ms winfrey and stated she is doing well on BP med. BP was high but was just in to see Dr. Sabra Heck and it was down. Will come in early march to fup on new BP medication prior to re-filling; Apt made for march 3 at [facility]

## 2015-05-01 DIAGNOSIS — M25461 Effusion, right knee: Secondary | ICD-10-CM | POA: Diagnosis not present

## 2015-05-01 DIAGNOSIS — M2550 Pain in unspecified joint: Secondary | ICD-10-CM | POA: Diagnosis not present

## 2015-05-01 DIAGNOSIS — M461 Sacroiliitis, not elsewhere classified: Secondary | ICD-10-CM | POA: Diagnosis not present

## 2015-05-08 DIAGNOSIS — M25461 Effusion, right knee: Secondary | ICD-10-CM | POA: Diagnosis not present

## 2015-05-22 DIAGNOSIS — M79672 Pain in left foot: Secondary | ICD-10-CM | POA: Diagnosis not present

## 2015-05-22 DIAGNOSIS — M064 Inflammatory polyarthropathy: Secondary | ICD-10-CM | POA: Diagnosis not present

## 2015-05-22 DIAGNOSIS — I788 Other diseases of capillaries: Secondary | ICD-10-CM | POA: Diagnosis not present

## 2015-05-22 DIAGNOSIS — M25561 Pain in right knee: Secondary | ICD-10-CM | POA: Diagnosis not present

## 2015-05-22 DIAGNOSIS — M79671 Pain in right foot: Secondary | ICD-10-CM | POA: Diagnosis not present

## 2015-05-22 DIAGNOSIS — L858 Other specified epidermal thickening: Secondary | ICD-10-CM | POA: Diagnosis not present

## 2015-05-22 DIAGNOSIS — R768 Other specified abnormal immunological findings in serum: Secondary | ICD-10-CM | POA: Diagnosis not present

## 2015-06-01 ENCOUNTER — Ambulatory Visit: Payer: Medicare Other | Admitting: Internal Medicine

## 2015-06-12 DIAGNOSIS — M25561 Pain in right knee: Secondary | ICD-10-CM | POA: Diagnosis not present

## 2015-06-12 DIAGNOSIS — R768 Other specified abnormal immunological findings in serum: Secondary | ICD-10-CM | POA: Diagnosis not present

## 2015-06-12 DIAGNOSIS — M064 Inflammatory polyarthropathy: Secondary | ICD-10-CM | POA: Diagnosis not present

## 2015-06-15 ENCOUNTER — Ambulatory Visit (INDEPENDENT_AMBULATORY_CARE_PROVIDER_SITE_OTHER): Payer: Medicare Other | Admitting: Internal Medicine

## 2015-06-15 ENCOUNTER — Encounter: Payer: Self-pay | Admitting: Internal Medicine

## 2015-06-15 VITALS — BP 160/78 | HR 60 | Wt 134.0 lb

## 2015-06-15 DIAGNOSIS — R7309 Other abnormal glucose: Secondary | ICD-10-CM | POA: Insufficient documentation

## 2015-06-15 DIAGNOSIS — D508 Other iron deficiency anemias: Secondary | ICD-10-CM

## 2015-06-15 DIAGNOSIS — M170 Bilateral primary osteoarthritis of knee: Secondary | ICD-10-CM

## 2015-06-15 DIAGNOSIS — D519 Vitamin B12 deficiency anemia, unspecified: Secondary | ICD-10-CM | POA: Insufficient documentation

## 2015-06-15 DIAGNOSIS — I10 Essential (primary) hypertension: Secondary | ICD-10-CM

## 2015-06-15 MED ORDER — LOSARTAN POTASSIUM-HCTZ 100-12.5 MG PO TABS: 1.0000 | ORAL_TABLET | Freq: Every day | ORAL | Status: DC

## 2015-06-15 NOTE — Progress Notes (Signed)
Subjective:  Patient ID: Caroline Olson, female    DOB: 03/08/1941  Age: 75 y.o. MRN: [id number]  CC: No chief complaint on file.   HPI REET ALLAN presents for HTN f/u. On Meloxicam for R knee pain (Dr Trudie Reed, Dr Lynann Bologna)) SBP 145-160 at home. Hgb was 10.5 and ANA 1:320  Outpatient Prescriptions Prior to Visit  Medication Sig Dispense Refill  . cholecalciferol (VITAMIN D) 1000 UNITS tablet Take 1,000 Units by mouth daily.    . Glucosamine Sulfate 500 MG TABS Take by mouth 2 (two) times daily.    . metronidazole (NORITATE) 1 % cream Apply topically 2 (two) times daily.     . Multiple Vitamins-Minerals (MULTIVITAMIN WITH MINERALS) tablet Take 1 tablet by mouth 3 (three) times a week.     . Multiple Vitamins-Minerals (PRESERVISION AREDS) CAPS Take by mouth daily.    . Omega-3 Fatty Acids (FISH OIL CONCENTRATE PO) Take by mouth. 1 tsp daily    . losartan (COZAAR) 50 MG tablet Take 1 tablet (50 mg total) by mouth daily. 30 tablet 3  . acetaminophen (TYLENOL) 500 MG tablet Take 500 mg by mouth 1 day or 1 dose. Reported on 06/15/2015    . erythromycin ophthalmic ointment Reported on 06/15/2015    . phenazopyridine (PYRIDIUM) 100 MG tablet Take 1 tablet (100 mg total) by mouth 3 (three) times daily as needed for pain. (Patient not taking: Reported on 06/15/2015) 15 tablet 0   No facility-administered medications prior to visit.    ROS Review of Systems  Constitutional: Negative for chills, activity change, appetite change, fatigue and unexpected weight change.  HENT: Negative for congestion, mouth sores and sinus pressure.   Eyes: Negative for visual disturbance.  Respiratory: Negative for cough and chest tightness.   Gastrointestinal: Negative for nausea and abdominal pain.  Genitourinary: Negative for frequency, difficulty urinating and vaginal pain.  Musculoskeletal: Positive for arthralgias. Negative for back pain and gait problem.  Skin: Negative for pallor and rash.  Neurological:  Negative for dizziness, tremors, weakness, numbness and headaches.  Psychiatric/Behavioral: Negative for confusion and sleep disturbance. The patient is not nervous/anxious.     Objective:  BP 160/78 mmHg  Pulse 60  Wt 134 lb (60.782 kg)  SpO2 97%  LMP 04/01/1983  BP Readings from Last 3 Encounters:  06/15/15 160/78  03/21/15 122/68  03/09/15 146/86    Wt Readings from Last 3 Encounters:  06/15/15 134 lb (60.782 kg)  03/21/15 131 lb (59.421 kg)  03/06/15 132 lb (59.875 kg)    Physical Exam  Constitutional: She appears well-developed. No distress.  HENT:  Head: Normocephalic.  Right Ear: External ear normal.  Left Ear: External ear normal.  Nose: Nose normal.  Mouth/Throat: Oropharynx is clear and moist.  Eyes: Conjunctivae are normal. Pupils are equal, round, and reactive to light. Right eye exhibits no discharge. Left eye exhibits no discharge.  Neck: Normal range of motion. Neck supple. No JVD present. No tracheal deviation present. No thyromegaly present.  Cardiovascular: Normal rate, regular rhythm and normal heart sounds.   Pulmonary/Chest: No stridor. No respiratory distress. She has no wheezes.  Abdominal: Soft. Bowel sounds are normal. She exhibits no distension and no mass. There is no tenderness. There is no rebound and no guarding.  Musculoskeletal: She exhibits tenderness. She exhibits no edema.  Lymphadenopathy:    She has no cervical adenopathy.  Neurological: She displays normal reflexes. No cranial nerve deficit. She exhibits normal muscle tone. Coordination normal.  Skin: No  rash noted. No erythema.  Psychiatric: She has a normal mood and affect. Her behavior is normal. Judgment and thought content normal.  R knee is tender  Lab Results  Component Value Date   WBC 5.3 02/14/2015   HGB 12.1 02/14/2015   HCT 36.5 02/14/2015   PLT 247.0 02/14/2015   GLUCOSE 90 02/14/2015   CHOL 198 02/14/2015   TRIG 44.0 02/14/2015   HDL 80.80 02/14/2015   LDLDIRECT  104.3 06/07/2008   LDLCALC 109* 02/14/2015   ALT 12 02/14/2015   AST 18 02/14/2015   NA 139 02/14/2015   K 4.2 02/14/2015   CL 102 02/14/2015   CREATININE 0.62 02/14/2015   BUN 12 02/14/2015   CO2 29 02/14/2015   TSH 0.98 02/14/2015    Mm Diag Breast Tomo Bilateral  03/09/2015  CLINICAL DATA:  75 year old female presenting for routine postlumpectomy follow-up of the left breast. The patient has history of a left breast lumpectomy in 2011. Currently asymptomatic. EXAM: DIGITAL DIAGNOSTIC BILATERAL MAMMOGRAM WITH 3D TOMOSYNTHESIS AND CAD COMPARISON:  Previous exam(s). ACR Breast Density Category c: The breast tissue is heterogeneously dense, which may obscure small masses. FINDINGS: The lumpectomy site in the left breast is stable. No mammographic evidence recurrence. No suspicious calcifications, masses or areas of distortion are seen in the bilateral breasts. Mammographic images were processed with CAD. IMPRESSION: Stable left breast lumpectomy site. No mammographic evidence of malignancy in the bilateral breasts. RECOMMENDATION: Diagnostic mammogram is suggested in 1 year. (Code:DM-B-01Y) I have discussed the findings and recommendations with the patient. Results were also provided in writing at the conclusion of the visit. If applicable, a reminder letter will be sent to the patient regarding the next appointment. BI-RADS CATEGORY  2: Benign. Electronically Signed   By: Ammie Ferrier M.D.   On: 03/09/2015 11:38    Assessment & Plan:   There are no diagnoses linked to this encounter. I have discontinued Ms. Godown's losartan. I am also having her maintain her metronidazole, multivitamin with minerals, Glucosamine Sulfate, cholecalciferol, Omega-3 Fatty Acids (FISH OIL CONCENTRATE PO), acetaminophen, erythromycin, PRESERVISION AREDS, phenazopyridine, and meloxicam.  Meds ordered this encounter  Medications  . meloxicam (MOBIC) 15 MG tablet    Sig: Take 1 tablet by mouth daily.      Follow-up: No Follow-up on file.  Walker Kehr, MD

## 2015-06-15 NOTE — Assessment & Plan Note (Signed)
Blood donor   Repeat labs Iron rich food

## 2015-06-15 NOTE — Progress Notes (Signed)
Pre visit review using our clinic review tool, if applicable. No additional management support is needed unless otherwise documented below in the visit note. 

## 2015-06-15 NOTE — Assessment & Plan Note (Signed)
SBP 145-160 at home Change Losartan HCT

## 2015-06-15 NOTE — Assessment & Plan Note (Signed)
Labs

## 2015-06-15 NOTE — Assessment & Plan Note (Signed)
On Meloxicam for R knee pain (Dr Trudie Reed, Dr Lynann Bologna))

## 2015-06-16 ENCOUNTER — Encounter: Payer: Self-pay | Admitting: Internal Medicine

## 2015-09-13 DIAGNOSIS — Z79899 Other long term (current) drug therapy: Secondary | ICD-10-CM | POA: Diagnosis not present

## 2015-09-13 DIAGNOSIS — M064 Inflammatory polyarthropathy: Secondary | ICD-10-CM | POA: Diagnosis not present

## 2015-09-13 DIAGNOSIS — R768 Other specified abnormal immunological findings in serum: Secondary | ICD-10-CM | POA: Diagnosis not present

## 2015-09-27 ENCOUNTER — Ambulatory Visit (INDEPENDENT_AMBULATORY_CARE_PROVIDER_SITE_OTHER): Payer: Medicare Other | Admitting: Obstetrics & Gynecology

## 2015-09-27 ENCOUNTER — Encounter: Payer: Self-pay | Admitting: Obstetrics & Gynecology

## 2015-09-27 VITALS — BP 124/62 | HR 64 | Resp 16 | Ht 64.5 in | Wt 132.8 lb

## 2015-09-27 DIAGNOSIS — D649 Anemia, unspecified: Secondary | ICD-10-CM | POA: Diagnosis not present

## 2015-09-27 DIAGNOSIS — Z01419 Encounter for gynecological examination (general) (routine) without abnormal findings: Secondary | ICD-10-CM | POA: Diagnosis not present

## 2015-09-27 LAB — POC HEMOCCULT BLD/STL (OFFICE/1-CARD/DIAGNOSTIC): FECAL OCCULT BLD: NEGATIVE

## 2015-09-27 NOTE — Progress Notes (Signed)
75 y.o. G2P2 MarriedCaucasianF here for annual exam.  Pt reports having some issues with anemia with hb 10.5.  This has been rechecked as it is followed currently by Dr. Trudie Reed.  Pt is on meloxicam for joint issues.  Sh eis being followed for osteoarthritis and inflammatory arthritis.  Pt reports she did give blood right before the hemoglobin was drawn but it hasn't really changed.  Denies vaginal bleeding.    Had an e coli UTI in December.  Was treated and symptoms resolved.  Pt did do a follow up urine test as the micro was abnormal.  Follow up was normal.    Patient's last menstrual period was 04/01/1983.          Sexually active: Yes.    The current method of family planning is post menopausal status.    Exercising: Yes.    walking Smoker:  no  Health Maintenance: Pap:  2012 negative  History of abnormal Pap:  no MMG:  03/09/2015 BIRADS 2 benign  Colonoscopy:  09/10/2012 polyp, repeat 10 years BMD:   03/06/2014 osteopenia  TDaP:  06/30/2013  Pneumonia vaccine(s):  01/25/2014  Zostavax:   06/07/2008 Hep C testing: not indicated Screening Labs: PCP, Hb today: PCP, Urine today: PCP   reports that she has never smoked. She has never used smokeless tobacco. She reports that she drinks about 3.0 oz of alcohol per week. She reports that she does not use illicit drugs.  Past Medical History  Diagnosis Date  . Breast cancer ([place]) 2011    DCIF  . Varicose veins   . Osteopenia     Past Surgical History  Procedure Laterality Date  . Abdominal hysterectomy  1985  . Shoulder surgery  2003    tear  . Breast lumpectomy  2011    left, reexcision 1/12  . Triceps tendon repair  2009  . Appendectomy  1956    pt states appendix was on left side not rt. per surgeon  . Rotator cuff repair Right 2009  . Endovenous ablation saphenous vein w/ laser Left 01-27-2013    left greater saphenous vein and sclerotherapy left leg by Curt Jews MD  . Endovenous ablation saphenous vein w/ laser Right  02-10-2013    right greater saphenous vein and sclerotherapy right leg by Curt Jews MD  . Stab phlebectomy Left 07-21-2013    10-20 incisions left leg by Curt Jews MD    Current Outpatient Prescriptions  Medication Sig Dispense Refill  . acetaminophen (TYLENOL) 500 MG tablet Take 500 mg by mouth 1 day or 1 dose. Reported on 06/15/2015    . cholecalciferol (VITAMIN D) 1000 UNITS tablet Take 1,000 Units by mouth daily.    Marland Kitchen erythromycin ophthalmic ointment Reported on 06/15/2015    . Glucosamine Sulfate 500 MG TABS Take by mouth 2 (two) times daily.    Marland Kitchen losartan-hydrochlorothiazide (HYZAAR) 100-12.5 MG tablet Take 1 tablet by mouth daily. 90 tablet 3  . meloxicam (MOBIC) 15 MG tablet Take 1 tablet by mouth daily.    . metronidazole (NORITATE) 1 % cream Apply topically 2 (two) times daily.     . Multiple Vitamins-Minerals (MULTIVITAMIN WITH MINERALS) tablet Take 1 tablet by mouth 3 (three) times a week.     . Multiple Vitamins-Minerals (PRESERVISION AREDS) CAPS Take by mouth daily.    . Omega-3 Fatty Acids (FISH OIL CONCENTRATE PO) Take by mouth. 1 tsp daily    . phenazopyridine (PYRIDIUM) 100 MG tablet Take 1 tablet (100 mg total)  by mouth 3 (three) times daily as needed for pain. (Patient not taking: Reported on 06/15/2015) 15 tablet 0   No current facility-administered medications for this visit.    Family History  Problem Relation Age of Onset  . Cancer Mother   . Hypertension Mother   . Other Mother     varicose veins  . Heart disease Father   . Diabetes Father   . Colon cancer Neg Hx     ROS:  Pertinent items are noted in HPI.  Otherwise, a comprehensive ROS was negative.  Exam:   Filed Vitals:   09/27/15 1100  BP: 124/62  Pulse: 64  Resp: 16  Height: 5' 4.5" (1.638 m)  Weight: 132 lb 12.8 oz (60.238 kg)   General appearance: alert, cooperative and appears stated age Head: Normocephalic, without obvious abnormality, atraumatic Neck: no adenopathy, supple,  symmetrical, trachea midline and thyroid normal to inspection and palpation Lungs: clear to auscultation bilaterally Breasts: normal right breast, no masses, skin changes, LAD; left breast with lumpectomy scar and radiation changes that are stable, no masses, no LAD Heart: regular rate and rhythm Abdomen: soft, non-tender; bowel sounds normal; no masses,  no organomegaly Extremities: extremities normal, atraumatic, no cyanosis or edema Skin: Skin color, texture, turgor normal. No rashes or lesions Lymph nodes: Cervical, supraclavicular, and axillary nodes normal. No abnormal inguinal nodes palpated Neurologic: Grossly normal   Pelvic: External genitalia:  no lesions              Urethra:  normal appearing urethra with no masses, tenderness or lesions              Bartholins and Skenes: normal                 Vagina: normal appearing vagina with normal color and discharge, no lesions              Cervix: absent              Pap taken: No. Bimanual Exam:  Uterus:  uterus absent              Adnexa: no mass, fullness, tenderness               Rectovaginal: Confirms               Anus:  normal sphincter tone, no lesions  Guaiac test:  Negative  Chaperone was present for exam.  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 No longer followed by oncology Osteopenia SUI. Improved after pelvic PT two years ago.  Still doing well. H/O TAH Anemia that is being followed by Dr. Trudie Reed.  Guaiac negative today.  Colonoscopy UPT in 2014.  P: Mammogram yearly pap smear not indicated. No pap obtained. Seeing Dr. Alain Marion next week. Has blood work that is present in future orders for appt next week Vaccines are UTD Return annually or prn

## 2015-09-28 ENCOUNTER — Ambulatory Visit (INDEPENDENT_AMBULATORY_CARE_PROVIDER_SITE_OTHER): Payer: Medicare Other | Admitting: Internal Medicine

## 2015-09-28 ENCOUNTER — Encounter: Payer: Self-pay | Admitting: Internal Medicine

## 2015-09-28 VITALS — BP 135/80 | HR 65 | Wt 134.0 lb

## 2015-09-28 DIAGNOSIS — M170 Bilateral primary osteoarthritis of knee: Secondary | ICD-10-CM | POA: Diagnosis not present

## 2015-09-28 DIAGNOSIS — D508 Other iron deficiency anemias: Secondary | ICD-10-CM

## 2015-09-28 DIAGNOSIS — I10 Essential (primary) hypertension: Secondary | ICD-10-CM

## 2015-09-28 MED ORDER — FERROUS SULFATE 325 (65 FE) MG PO TABS: 325.0000 mg | ORAL_TABLET | Freq: Every day | ORAL | Status: DC

## 2015-09-28 MED ORDER — IRBESARTAN-HYDROCHLOROTHIAZIDE 300-12.5 MG PO TABS: 1.0000 | ORAL_TABLET | Freq: Every day | ORAL | Status: DC

## 2015-09-28 NOTE — Progress Notes (Signed)
Pre visit review using our clinic review tool, if applicable. No additional management support is needed unless otherwise documented below in the visit note. 

## 2015-09-28 NOTE — Assessment & Plan Note (Signed)
Donor Take iron OTC

## 2015-09-28 NOTE — Progress Notes (Signed)
Subjective:  Patient ID: Caroline Olson, female    DOB: 1940-04-03  Age: 75 y.o. MRN: [id number]  CC: No chief complaint on file.   HPI RUDINE GRANTHAM presents for HTN, anemia - blood donor, OA f/u  Outpatient Prescriptions Prior to Visit  Medication Sig Dispense Refill  . cholecalciferol (VITAMIN D) 1000 UNITS tablet Take 1,000 Units by mouth daily.    . Glucosamine Sulfate 500 MG TABS Take by mouth 2 (two) times daily.    Marland Kitchen losartan-hydrochlorothiazide (HYZAAR) 100-12.5 MG tablet Take 1 tablet by mouth daily. 90 tablet 3  . meloxicam (MOBIC) 15 MG tablet Take 1 tablet by mouth daily.    . metronidazole (NORITATE) 1 % cream Apply topically 2 (two) times daily.     . Multiple Vitamins-Minerals (MULTIVITAMIN WITH MINERALS) tablet Take 1 tablet by mouth 3 (three) times a week.     . Multiple Vitamins-Minerals (PRESERVISION AREDS) CAPS Take by mouth daily.    . Omega-3 Fatty Acids (FISH OIL CONCENTRATE PO) Take by mouth. 1 tsp daily    . acetaminophen (TYLENOL) 500 MG tablet Take 500 mg by mouth 1 day or 1 dose. Reported on 09/28/2015     No facility-administered medications prior to visit.    ROS Review of Systems  Constitutional: Negative for chills, activity change, appetite change, fatigue and unexpected weight change.  HENT: Negative for congestion, mouth sores and sinus pressure.   Eyes: Negative for visual disturbance.  Respiratory: Negative for cough and chest tightness.   Gastrointestinal: Negative for nausea and abdominal pain.  Genitourinary: Negative for frequency, difficulty urinating and vaginal pain.  Musculoskeletal: Negative for back pain, gait problem and neck stiffness.  Skin: Negative for pallor and rash.  Neurological: Negative for dizziness, tremors, weakness, numbness and headaches.  Psychiatric/Behavioral: Negative for confusion and sleep disturbance.    Objective:  BP 158/72 mmHg  Pulse 65  Wt 134 lb (60.782 kg)  SpO2 97%  LMP 04/01/1983  BP Readings  from Last 3 Encounters:  09/28/15 158/72  09/27/15 124/62  06/15/15 160/78    Wt Readings from Last 3 Encounters:  09/28/15 134 lb (60.782 kg)  09/27/15 132 lb 12.8 oz (60.238 kg)  06/15/15 134 lb (60.782 kg)    Physical Exam  Constitutional: She appears well-developed. No distress.  HENT:  Head: Normocephalic.  Right Ear: External ear normal.  Left Ear: External ear normal.  Nose: Nose normal.  Mouth/Throat: Oropharynx is clear and moist.  Eyes: Conjunctivae are normal. Pupils are equal, round, and reactive to light. Right eye exhibits no discharge. Left eye exhibits no discharge.  Neck: Normal range of motion. Neck supple. No JVD present. No tracheal deviation present. No thyromegaly present.  Cardiovascular: Normal rate, regular rhythm and normal heart sounds.   Pulmonary/Chest: No stridor. No respiratory distress. She has no wheezes.  Abdominal: Soft. Bowel sounds are normal. She exhibits no distension and no mass. There is no tenderness. There is no rebound and no guarding.  Musculoskeletal: She exhibits no edema or tenderness.  Lymphadenopathy:    She has no cervical adenopathy.  Neurological: She displays normal reflexes. No cranial nerve deficit. She exhibits normal muscle tone. Coordination normal.  Skin: No rash noted. No erythema.  Psychiatric: She has a normal mood and affect. Her behavior is normal. Judgment and thought content normal.  knees w/o swelling  Lab Results  Component Value Date   WBC 5.3 02/14/2015   HGB 12.1 02/14/2015   HCT 36.5 02/14/2015   PLT  247.0 02/14/2015   GLUCOSE 90 02/14/2015   CHOL 198 02/14/2015   TRIG 44.0 02/14/2015   HDL 80.80 02/14/2015   LDLDIRECT 104.3 06/07/2008   LDLCALC 109* 02/14/2015   ALT 12 02/14/2015   AST 18 02/14/2015   NA 139 02/14/2015   K 4.2 02/14/2015   CL 102 02/14/2015   CREATININE 0.62 02/14/2015   BUN 12 02/14/2015   CO2 29 02/14/2015   TSH 0.98 02/14/2015    Mm Diag Breast Tomo  Bilateral  03/09/2015  CLINICAL DATA:  75 year old female presenting for routine postlumpectomy follow-up of the left breast. The patient has history of a left breast lumpectomy in 2011. Currently asymptomatic. EXAM: DIGITAL DIAGNOSTIC BILATERAL MAMMOGRAM WITH 3D TOMOSYNTHESIS AND CAD COMPARISON:  Previous exam(s). ACR Breast Density Category c: The breast tissue is heterogeneously dense, which may obscure small masses. FINDINGS: The lumpectomy site in the left breast is stable. No mammographic evidence recurrence. No suspicious calcifications, masses or areas of distortion are seen in the bilateral breasts. Mammographic images were processed with CAD. IMPRESSION: Stable left breast lumpectomy site. No mammographic evidence of malignancy in the bilateral breasts. RECOMMENDATION: Diagnostic mammogram is suggested in 1 year. (Code:DM-B-01Y) I have discussed the findings and recommendations with the patient. Results were also provided in writing at the conclusion of the visit. If applicable, a reminder letter will be sent to the patient regarding the next appointment. BI-RADS CATEGORY  2: Benign. Electronically Signed   By: Ammie Ferrier M.D.   On: 03/09/2015 11:38    Assessment & Plan:   There are no diagnoses linked to this encounter. I am having Ms. Resler maintain her metronidazole, multivitamin with minerals, Glucosamine Sulfate, cholecalciferol, Omega-3 Fatty Acids (FISH OIL CONCENTRATE PO), acetaminophen, PRESERVISION AREDS, meloxicam, and losartan-hydrochlorothiazide.  No orders of the defined types were placed in this encounter.     Follow-up: No Follow-up on file.  Walker Kehr, MD

## 2015-09-28 NOTE — Assessment & Plan Note (Signed)
Not better - change to [facility]

## 2015-09-28 NOTE — Assessment & Plan Note (Signed)
On Meloxicam 

## 2015-10-11 ENCOUNTER — Telehealth: Payer: Self-pay | Admitting: Obstetrics & Gynecology

## 2015-10-11 ENCOUNTER — Ambulatory Visit (INDEPENDENT_AMBULATORY_CARE_PROVIDER_SITE_OTHER): Payer: Medicare Other | Admitting: Certified Nurse Midwife

## 2015-10-11 ENCOUNTER — Encounter: Payer: Self-pay | Admitting: Certified Nurse Midwife

## 2015-10-11 VITALS — BP 122/62 | HR 60 | Temp 98.7°F | Resp 16 | Ht 64.5 in | Wt 134.0 lb

## 2015-10-11 DIAGNOSIS — N390 Urinary tract infection, site not specified: Secondary | ICD-10-CM | POA: Diagnosis not present

## 2015-10-11 LAB — POCT URINALYSIS DIPSTICK
Bilirubin, UA: NEGATIVE
GLUCOSE UA: NEGATIVE
Ketones, UA: NEGATIVE
Nitrite, UA: NEGATIVE
Protein, UA: NEGATIVE
RBC UA: NEGATIVE
UROBILINOGEN UA: NEGATIVE
pH, UA: 5

## 2015-10-11 MED ORDER — NITROFURANTOIN MONOHYD MACRO 100 MG PO CAPS: 100.0000 mg | ORAL_CAPSULE | Freq: Two times a day (BID) | ORAL | Status: DC

## 2015-10-11 NOTE — Telephone Encounter (Signed)
Encounter not needed. Appointment scheduled.

## 2015-10-11 NOTE — Patient Instructions (Signed)

## 2015-10-11 NOTE — Progress Notes (Signed)
74 y.o. Married Caucasian female G2P2 here with complaint of UTI, with onset  on 4 days. Patient complaining of urinary frequency with urination and urine odor. Patient denies fever, chills, nausea or back pain. No new personal products. Patient feels not to sexual activity. Denies any vaginal symptoms.  Menopausal with no  vaginal dryness that she is aware of.. Patient drinking adequate water intake. No other health issues today.  O: Healthy female WDWN Affect: Normal, orientation x 3 Skin : warm and dry CVAT: neg. bilateral Abdomen: positive for suprapubic tenderness  Pelvic exam: External genital area: normal, no lesions Bladder,Urethra slightly tender, Urethral meatus: tender, red Vagina: normal vaginal discharge, normal appearance   Cervix: normal, non tender Uterus:normal,non tender Adnexa: normal non tender, no fullness or masses   A: Urethritis/UTI Normal pelvic exam poct urine: cloudy, wbc 1+  P: Reviewed findings of UTI and need for treatment. Rx: Macrobid see order with instructions [id number] micro, culture Reviewed warning signs and symptoms of UTI and need to advise if occurring. Encouraged to limit soda, tea, and coffee and be sure to increase water intake.  Discussed coconut oil use for moisture around meatus after treatment. Questions addressed  RV prn

## 2015-10-12 ENCOUNTER — Telehealth: Payer: Self-pay

## 2015-10-12 LAB — URINALYSIS, MICROSCOPIC ONLY
Bacteria, UA: NONE SEEN [HPF]
CASTS: NONE SEEN [LPF]
Crystals: NONE SEEN [HPF]
SQUAMOUS EPITHELIAL / LPF: NONE SEEN [HPF] (ref <=5)
WBC, UA: >60 WBC/HPF — AB (ref <=5)
Yeast: NONE SEEN [HPF]

## 2015-10-12 NOTE — Telephone Encounter (Signed)
-----   Message from Regina Eck, CNM sent at 10/12/2015  2:28 PM EDT ----- Notify patient that urine micro showed WBC and RBC which indicate some possible infection. Continue medication. Culture pending Patient status?

## 2015-10-12 NOTE — Telephone Encounter (Signed)
Patient notified of results. See lab 

## 2015-10-12 NOTE — Telephone Encounter (Signed)
lmtcb

## 2015-10-13 LAB — URINE CULTURE: Colony Count: 25000

## 2015-10-15 NOTE — Progress Notes (Signed)
Encounter reviewed Jill Jertson, MD   

## 2015-12-25 ENCOUNTER — Telehealth: Payer: Self-pay | Admitting: *Deleted

## 2015-12-25 NOTE — Telephone Encounter (Signed)
Pt states she was calling to find out how to keep fungus from a nail that fell off and is growing back. I told pt to keep area clean and dry, no polish, cotton socks to wick moisture away.

## 2016-01-23 DIAGNOSIS — M25561 Pain in right knee: Secondary | ICD-10-CM | POA: Diagnosis not present

## 2016-01-23 DIAGNOSIS — R5383 Other fatigue: Secondary | ICD-10-CM | POA: Diagnosis not present

## 2016-01-23 DIAGNOSIS — Z79899 Other long term (current) drug therapy: Secondary | ICD-10-CM | POA: Diagnosis not present

## 2016-01-23 DIAGNOSIS — M0609 Rheumatoid arthritis without rheumatoid factor, multiple sites: Secondary | ICD-10-CM | POA: Diagnosis not present

## 2016-01-23 DIAGNOSIS — M25562 Pain in left knee: Secondary | ICD-10-CM | POA: Diagnosis not present

## 2016-01-24 ENCOUNTER — Encounter: Payer: Self-pay | Admitting: Internal Medicine

## 2016-01-24 ENCOUNTER — Ambulatory Visit (INDEPENDENT_AMBULATORY_CARE_PROVIDER_SITE_OTHER): Payer: Medicare Other | Admitting: Internal Medicine

## 2016-01-24 DIAGNOSIS — D508 Other iron deficiency anemias: Secondary | ICD-10-CM

## 2016-01-24 DIAGNOSIS — M170 Bilateral primary osteoarthritis of knee: Secondary | ICD-10-CM | POA: Diagnosis not present

## 2016-01-24 DIAGNOSIS — I10 Essential (primary) hypertension: Secondary | ICD-10-CM

## 2016-01-24 NOTE — Assessment & Plan Note (Signed)
Better [facility]

## 2016-01-24 NOTE — Progress Notes (Signed)
Pre visit review using our clinic review tool, if applicable. No additional management support is needed unless otherwise documented below in the visit note. 

## 2016-01-24 NOTE — Assessment & Plan Note (Signed)
Labs done yesterday On iron

## 2016-01-24 NOTE — Assessment & Plan Note (Signed)
Inflammatory OA, r/o RA On Meloxicam prn Labs done yesterday

## 2016-01-24 NOTE — Progress Notes (Signed)
Subjective:  Patient ID: Caroline Olson, female    DOB: [birth year]  Age: 75 y.o. MRN: [id number]  CC: No chief complaint on file.   HPI Caroline Olson presents for a blood donor anemia - mild, HTN, inflammatory OA f/u  Outpatient Medications Prior to Visit  Medication Sig Dispense Refill  . cholecalciferol (VITAMIN D) 1000 UNITS tablet Take 1,000 Units by mouth daily.    . ferrous sulfate 325 (65 FE) MG tablet Take 1 tablet (325 mg total) by mouth daily. (Patient taking differently: Take 325 mg by mouth 3 (three) times a week. ) 30 tablet 3  . Glucosamine Sulfate 500 MG TABS Take by mouth 2 (two) times daily.    . irbesartan-hydrochlorothiazide (AVALIDE) 300-12.5 MG tablet Take 1 tablet by mouth daily. 90 tablet 3  . meloxicam (MOBIC) 15 MG tablet Take 1 tablet by mouth daily.    . metronidazole (NORITATE) 1 % cream Apply topically 2 (two) times daily.     . Multiple Vitamins-Minerals (MULTIVITAMIN WITH MINERALS) tablet Take 1 tablet by mouth 3 (three) times a week.     . Multiple Vitamins-Minerals (PRESERVISION AREDS) CAPS Take by mouth daily.    . Omega-3 Fatty Acids (FISH OIL CONCENTRATE PO) Take by mouth. 1 tsp daily    . losartan-hydrochlorothiazide (HYZAAR) 100-12.5 MG tablet Reported on 10/11/2015    . nitrofurantoin, macrocrystal-monohydrate, (MACROBID) 100 MG capsule Take 1 capsule (100 mg total) by mouth 2 (two) times daily. (Patient not taking: Reported on 01/24/2016) 14 capsule 0   No facility-administered medications prior to visit.     ROS Review of Systems  Constitutional: Negative for activity change, appetite change, chills, fatigue and unexpected weight change.  HENT: Negative for congestion, mouth sores and sinus pressure.   Eyes: Negative for visual disturbance.  Respiratory: Negative for cough and chest tightness.   Gastrointestinal: Negative for abdominal pain and nausea.  Genitourinary: Negative for difficulty urinating, frequency and vaginal pain.    Musculoskeletal: Positive for arthralgias. Negative for back pain and gait problem.  Skin: Negative for pallor and rash.  Neurological: Negative for dizziness, tremors, weakness, numbness and headaches.  Psychiatric/Behavioral: Negative for confusion and sleep disturbance.    Objective:  BP (!) 160/80   Pulse 64   Wt 138 lb (62.6 kg)   LMP 04/01/1983   SpO2 98%   BMI 23.32 kg/m   BP Readings from Last 3 Encounters:  01/24/16 (!) 160/80  10/11/15 122/62  09/28/15 135/80    Wt Readings from Last 3 Encounters:  01/24/16 138 lb (62.6 kg)  10/11/15 134 lb (60.8 kg)  09/28/15 134 lb (60.8 kg)    Physical Exam  Constitutional: She appears well-developed. No distress.  HENT:  Head: Normocephalic.  Right Ear: External ear normal.  Left Ear: External ear normal.  Nose: Nose normal.  Mouth/Throat: Oropharynx is clear and moist.  Eyes: Conjunctivae are normal. Pupils are equal, round, and reactive to light. Right eye exhibits no discharge. Left eye exhibits no discharge.  Neck: Normal range of motion. Neck supple. No JVD present. No tracheal deviation present. No thyromegaly present.  Cardiovascular: Normal rate, regular rhythm and normal heart sounds.   Pulmonary/Chest: No stridor. No respiratory distress. She has no wheezes.  Abdominal: Soft. Bowel sounds are normal. She exhibits no distension and no mass. There is no tenderness. There is no rebound and no guarding.  Musculoskeletal: She exhibits tenderness. She exhibits no edema.  Lymphadenopathy:    She has no cervical adenopathy.  Neurological: She displays normal reflexes. No cranial nerve deficit. She exhibits normal muscle tone. Coordination normal.  Skin: No rash noted. No erythema.  Psychiatric: She has a normal mood and affect. Her behavior is normal. Judgment and thought content normal.  knees are tender  Lab Results  Component Value Date   WBC 5.3 02/14/2015   HGB 12.1 02/14/2015   HCT 36.5 02/14/2015   PLT  247.0 02/14/2015   GLUCOSE 90 02/14/2015   CHOL 198 02/14/2015   TRIG 44.0 02/14/2015   HDL 80.80 02/14/2015   LDLDIRECT 104.3 06/07/2008   LDLCALC 109 (H) 02/14/2015   ALT 12 02/14/2015   AST 18 02/14/2015   NA 139 02/14/2015   K 4.2 02/14/2015   CL 102 02/14/2015   CREATININE 0.62 02/14/2015   BUN 12 02/14/2015   CO2 29 02/14/2015   TSH 0.98 02/14/2015    Mm Diag Breast Tomo Bilateral  Result Date: 03/09/2015 CLINICAL DATA:  75 year old female presenting for routine postlumpectomy follow-up of the left breast. The patient has history of a left breast lumpectomy in 2011. Currently asymptomatic. EXAM: DIGITAL DIAGNOSTIC BILATERAL MAMMOGRAM WITH 3D TOMOSYNTHESIS AND CAD COMPARISON:  Previous exam(s). ACR Breast Density Category c: The breast tissue is heterogeneously dense, which may obscure small masses. FINDINGS: The lumpectomy site in the left breast is stable. No mammographic evidence recurrence. No suspicious calcifications, masses or areas of distortion are seen in the bilateral breasts. Mammographic images were processed with CAD. IMPRESSION: Stable left breast lumpectomy site. No mammographic evidence of malignancy in the bilateral breasts. RECOMMENDATION: Diagnostic mammogram is suggested in 1 year. (Code:DM-B-01Y) I have discussed the findings and recommendations with the patient. Results were also provided in writing at the conclusion of the visit. If applicable, a reminder letter will be sent to the patient regarding the next appointment. BI-RADS CATEGORY  2: Benign. Electronically Signed   By: Ammie Ferrier M.D.   On: 03/09/2015 11:38    Assessment & Plan:   There are no diagnoses linked to this encounter. I have discontinued Caroline Olson's losartan-hydrochlorothiazide and nitrofurantoin (macrocrystal-monohydrate). I am also having her maintain her metronidazole, multivitamin with minerals, Glucosamine Sulfate, cholecalciferol, Omega-3 Fatty Acids (FISH OIL CONCENTRATE PO),  PRESERVISION AREDS, meloxicam, irbesartan-hydrochlorothiazide, and ferrous sulfate.  No orders of the defined types were placed in this encounter.    Follow-up: No Follow-up on file.  Walker Kehr, MD

## 2016-02-01 ENCOUNTER — Other Ambulatory Visit: Payer: Self-pay | Admitting: Obstetrics & Gynecology

## 2016-02-01 DIAGNOSIS — Z853 Personal history of malignant neoplasm of breast: Secondary | ICD-10-CM

## 2016-02-04 ENCOUNTER — Encounter: Payer: Medicare Other | Admitting: Internal Medicine

## 2016-02-09 LAB — GLUCOSE, POCT (MANUAL RESULT ENTRY): POC Glucose: 109 mg/dl — AB (ref 70–99)

## 2016-02-18 DIAGNOSIS — H353131 Nonexudative age-related macular degeneration, bilateral, early dry stage: Secondary | ICD-10-CM | POA: Diagnosis not present

## 2016-02-18 DIAGNOSIS — H52222 Regular astigmatism, left eye: Secondary | ICD-10-CM | POA: Diagnosis not present

## 2016-02-18 DIAGNOSIS — H524 Presbyopia: Secondary | ICD-10-CM | POA: Diagnosis not present

## 2016-02-18 DIAGNOSIS — H04123 Dry eye syndrome of bilateral lacrimal glands: Secondary | ICD-10-CM | POA: Diagnosis not present

## 2016-02-18 DIAGNOSIS — Z79899 Other long term (current) drug therapy: Secondary | ICD-10-CM | POA: Diagnosis not present

## 2016-02-18 DIAGNOSIS — H52221 Regular astigmatism, right eye: Secondary | ICD-10-CM | POA: Diagnosis not present

## 2016-02-18 DIAGNOSIS — H5212 Myopia, left eye: Secondary | ICD-10-CM | POA: Diagnosis not present

## 2016-02-18 DIAGNOSIS — L719 Rosacea, unspecified: Secondary | ICD-10-CM | POA: Diagnosis not present

## 2016-03-07 ENCOUNTER — Telehealth: Payer: Self-pay | Admitting: Emergency Medicine

## 2016-03-07 ENCOUNTER — Other Ambulatory Visit: Payer: Self-pay

## 2016-03-07 NOTE — Telephone Encounter (Addendum)
Chart updated. See immunizations.  

## 2016-03-07 NOTE — Telephone Encounter (Signed)
Pt called and had her flu shot on 02/09/16 at [facility]. Thanks.

## 2016-03-11 ENCOUNTER — Ambulatory Visit
Admission: RE | Admit: 2016-03-11 | Discharge: 2016-03-11 | Disposition: A | Discharge status: Home / Self Care | Payer: Medicare Other | Source: Ambulatory Visit | Attending: Obstetrics & Gynecology | Admitting: Obstetrics & Gynecology

## 2016-03-11 DIAGNOSIS — Z853 Personal history of malignant neoplasm of breast: Secondary | ICD-10-CM

## 2016-03-11 DIAGNOSIS — R928 Other abnormal and inconclusive findings on diagnostic imaging of breast: Secondary | ICD-10-CM | POA: Diagnosis not present

## 2016-03-14 DIAGNOSIS — M25562 Pain in left knee: Secondary | ICD-10-CM | POA: Diagnosis not present

## 2016-03-14 DIAGNOSIS — M0609 Rheumatoid arthritis without rheumatoid factor, multiple sites: Secondary | ICD-10-CM | POA: Diagnosis not present

## 2016-03-14 DIAGNOSIS — M25561 Pain in right knee: Secondary | ICD-10-CM | POA: Diagnosis not present

## 2016-03-14 DIAGNOSIS — Z79899 Other long term (current) drug therapy: Secondary | ICD-10-CM | POA: Diagnosis not present

## 2016-03-14 DIAGNOSIS — Z6822 Body mass index (BMI) 22.0-22.9, adult: Secondary | ICD-10-CM | POA: Diagnosis not present

## 2016-04-04 DIAGNOSIS — L821 Other seborrheic keratosis: Secondary | ICD-10-CM | POA: Diagnosis not present

## 2016-04-04 DIAGNOSIS — D1801 Hemangioma of skin and subcutaneous tissue: Secondary | ICD-10-CM | POA: Diagnosis not present

## 2016-04-04 DIAGNOSIS — D2239 Melanocytic nevi of other parts of face: Secondary | ICD-10-CM | POA: Diagnosis not present

## 2016-04-04 DIAGNOSIS — L570 Actinic keratosis: Secondary | ICD-10-CM | POA: Diagnosis not present

## 2016-04-04 DIAGNOSIS — D692 Other nonthrombocytopenic purpura: Secondary | ICD-10-CM | POA: Diagnosis not present

## 2016-04-04 DIAGNOSIS — B351 Tinea unguium: Secondary | ICD-10-CM | POA: Diagnosis not present

## 2016-05-07 DIAGNOSIS — G43809 Other migraine, not intractable, without status migrainosus: Secondary | ICD-10-CM | POA: Diagnosis not present

## 2016-06-09 ENCOUNTER — Other Ambulatory Visit (INDEPENDENT_AMBULATORY_CARE_PROVIDER_SITE_OTHER): Payer: Medicare Other

## 2016-06-09 ENCOUNTER — Ambulatory Visit (INDEPENDENT_AMBULATORY_CARE_PROVIDER_SITE_OTHER): Payer: Medicare Other | Admitting: Nurse Practitioner

## 2016-06-09 ENCOUNTER — Encounter: Payer: Self-pay | Admitting: Nurse Practitioner

## 2016-06-09 VITALS — BP 142/74 | HR 70 | Temp 98.7°F | Ht 65.0 in | Wt 136.0 lb

## 2016-06-09 DIAGNOSIS — B029 Zoster without complications: Secondary | ICD-10-CM

## 2016-06-09 LAB — BASIC METABOLIC PANEL
BUN: 22 mg/dL (ref 6–23)
CO2: 31 meq/L (ref 19–32)
CREATININE: 1.03 mg/dL (ref 0.40–1.20)
Calcium: 10.1 mg/dL (ref 8.4–10.5)
Chloride: 100 mEq/L (ref 96–112)
GFR: 55.15 mL/min — ABNORMAL LOW (ref >60.00)
GLUCOSE: 70 mg/dL (ref 70–99)
Potassium: 4.5 mEq/L (ref 3.5–5.1)
Sodium: 136 mEq/L (ref 135–145)

## 2016-06-09 MED ORDER — VALACYCLOVIR HCL 1 G PO TABS
1000.0000 mg | ORAL_TABLET | Freq: Three times a day (TID) | ORAL | 0 refills | Status: DC
Start: 2016-06-09 — End: 2016-08-04

## 2016-06-09 MED ORDER — GABAPENTIN 100 MG PO CAPS: ORAL_CAPSULE | ORAL | 1 refills | Status: DC

## 2016-06-09 NOTE — Patient Instructions (Addendum)
May also use Ibuprofen 800mg  oral every 6-8hrs as needed for pain (take with food).  Shingles Shingles, which is also known as herpes zoster, is an infection that causes a painful skin rash and fluid-filled blisters. Shingles is not related to genital herpes, which is a sexually transmitted infection. Shingles only develops in people who:  Have had chickenpox.  Have received the chickenpox vaccine. (This is rare.) What are the causes? Shingles is caused by varicella-zoster virus (VZV). This is the same virus that causes chickenpox. After exposure to VZV, the virus stays in the body in an inactive (dormant) state. Shingles develops if the virus reactivates. This can happen many years after the initial exposure to VZV. It is not known what causes this virus to reactivate. What increases the risk? People who have had chickenpox or received the chickenpox vaccine are at risk for shingles. Infection is more common in people who:  Are older than age 2.  Have a weakened defense (immune) system, such as those with HIV, AIDS, or cancer.  Are taking medicines that weaken the immune system, such as transplant medicines.  Are under great stress. What are the signs or symptoms? Early symptoms of this condition include itching, tingling, and pain in an area on your skin. Pain may be described as burning, stabbing, or throbbing. A few days or weeks after symptoms start, a painful red rash appears, usually on one side of the body in a bandlike or beltlike pattern. The rash eventually turns into fluid-filled blisters that break open, scab over, and dry up in about 2-3 weeks. At any time during the infection, you may also develop:  A fever.  Chills.  A headache.  An upset stomach. How is this diagnosed? This condition is diagnosed with a skin exam. Sometimes, skin or fluid samples are taken from the blisters before a diagnosis is made. These samples are examined under a microscope or sent to a lab  for testing. How is this treated? There is no specific cure for this condition. Your health care provider will probably prescribe medicines to help you manage pain, recover more quickly, and avoid long-term problems. Medicines may include:  Antiviral drugs.  Anti-inflammatory drugs.  Pain medicines. If the area involved is on your face, you may be referred to a specialist, such as an eye doctor (ophthalmologist) or an ear, nose, and throat (ENT) doctor to help you avoid eye problems, chronic pain, or disability. Follow these instructions at home: Medicines   Take medicines only as directed by your health care provider.  Apply an anti-itch or numbing cream to the affected area as directed by your health care provider. Blister and Rash Care   Take a cool bath or apply cool compresses to the area of the rash or blisters as directed by your health care provider. This may help with pain and itching.  Keep your rash covered with a loose bandage (dressing). Wear loose-fitting clothing to help ease the pain of material rubbing against the rash.  Keep your rash and blisters clean with mild soap and cool water or as directed by your health care provider.  Check your rash every day for signs of infection. These include redness, swelling, and pain that lasts or increases.  Do not pick your blisters.  Do not scratch your rash. General instructions   Rest as directed by your health care provider.  Keep all follow-up visits as directed by your health care provider. This is important.  Until your blisters scab over,  your infection can cause chickenpox in people who have never had it or been vaccinated against it. To prevent this from happening, avoid contact with other people, especially:  Babies.  Pregnant women.  Children who have eczema.  Elderly people who have transplants.  People who have chronic illnesses, such as leukemia or AIDS. Contact a health care provider if:  Your pain  is not relieved with prescribed medicines.  Your pain does not get better after the rash heals.  Your rash looks infected. Signs of infection include redness, swelling, and pain that lasts or increases. Get help right away if:  The rash is on your face or nose.  You have facial pain, pain around your eye area, or loss of feeling on one side of your face.  You have ear pain or you have ringing in your ear.  You have loss of taste.  Your condition gets worse. This information is not intended to replace advice given to you by your health care provider. Make sure you discuss any questions you have with your health care provider. Document Released: 03/17/2005 Document Revised: 11/11/2015 Document Reviewed: 01/26/2014 Elsevier Interactive Patient Education  2017 [employer].

## 2016-06-09 NOTE — Progress Notes (Signed)
Subjective:  Patient ID: Caroline Olson, female    DOB: 09-03-1940  Age: 76 y.o. MRN: [id number]  CC: Herpes Zoster (Pt stated have red bump are burning/painful for 4 days)   Rash  This is a new problem. Episode onset: on Friday. The problem has been rapidly worsening since onset. The affected locations include the torso (left lumbar). The rash is characterized by blistering, burning, pain and redness. She was exposed to nothing. Pertinent negatives include no facial edema, fatigue, fever or joint pain. Past treatments include nothing. Her past medical history is significant for varicella.    Outpatient Medications Prior to Visit  Medication Sig Dispense Refill  . cholecalciferol (VITAMIN D) 1000 UNITS tablet Take 1,000 Units by mouth daily.    . ferrous sulfate 325 (65 FE) MG tablet Take 1 tablet (325 mg total) by mouth daily. (Patient taking differently: Take 325 mg by mouth 3 (three) times a week. ) 30 tablet 3  . Glucosamine Sulfate 500 MG TABS Take by mouth 2 (two) times daily.    . irbesartan-hydrochlorothiazide (AVALIDE) 300-12.5 MG tablet Take 1 tablet by mouth daily. 90 tablet 3  . metronidazole (NORITATE) 1 % cream Apply topically 2 (two) times daily.     . Multiple Vitamins-Minerals (MULTIVITAMIN WITH MINERALS) tablet Take 1 tablet by mouth 3 (three) times a week.     . Multiple Vitamins-Minerals (PRESERVISION AREDS) CAPS Take by mouth daily.    . Omega-3 Fatty Acids (FISH OIL CONCENTRATE PO) Take by mouth. 1 tsp daily    . meloxicam (MOBIC) 15 MG tablet Take 1 tablet by mouth daily.     No facility-administered medications prior to visit.     ROS See HPI  Objective:  BP (!) 142/74   Pulse 70   Temp 98.7 F (37.1 C)   Ht 5\' 5"  (1.651 m)   Wt 136 lb (61.7 kg)   LMP 04/01/1983   SpO2 98%   BMI 22.63 kg/m   BP Readings from Last 3 Encounters:  06/09/16 (!) 142/74  01/24/16 (!) 160/80  10/11/15 122/62    Wt Readings from Last 3 Encounters:  06/09/16 136 lb (61.7  kg)  01/24/16 138 lb (62.6 kg)  10/11/15 134 lb (60.8 kg)    Physical Exam  Constitutional: No distress.  Neck: Normal range of motion. Neck supple.  Cardiovascular: Normal rate.   Pulmonary/Chest: Effort normal.  Skin: Skin is warm and dry. Lesion and rash noted. Rash is vesicular. There is erythema.     Vitals reviewed.   Lab Results  Component Value Date   WBC 5.3 02/14/2015   HGB 12.1 02/14/2015   HCT 36.5 02/14/2015   PLT 247.0 02/14/2015   GLUCOSE 70 06/09/2016   CHOL 198 02/14/2015   TRIG 44.0 02/14/2015   HDL 80.80 02/14/2015   LDLDIRECT 104.3 06/07/2008   LDLCALC 109 (H) 02/14/2015   ALT 12 02/14/2015   AST 18 02/14/2015   NA 136 06/09/2016   K 4.5 06/09/2016   CL 100 06/09/2016   CREATININE 1.03 06/09/2016   BUN 22 06/09/2016   CO2 31 06/09/2016   TSH 0.98 02/14/2015    Mm Diag Breast Tomo Bilateral  Result Date: 03/11/2016 CLINICAL DATA:  76 year old female with history of left breast cancer post lumpectomy 2011 followed by radiation therapy. EXAM: 2D DIGITAL DIAGNOSTIC BILATERAL MAMMOGRAM WITH CAD AND ADJUNCT TOMO COMPARISON:  Previous exam(s). ACR Breast Density Category b: There are scattered areas of fibroglandular density. FINDINGS: No suspicious masses or  calcifications are seen in either breast. Postsurgical changes are present in the central upper left breast related to prior lumpectomy. Spot compression magnification MLO view of the lumpectomy site in the left breast was performed. There is no mammographic evidence of locally recurrent malignancy. Mammographic images were processed with CAD. IMPRESSION: No mammographic evidence of malignancy in either breast. RECOMMENDATION: Diagnostic mammogram is suggested in 1 year. (Code:DM-B-01Y) I have discussed the findings and recommendations with the patient. Results were also provided in writing at the conclusion of the visit. If applicable, a reminder letter will be sent to the patient regarding the next  appointment. BI-RADS CATEGORY  2: Benign. Electronically Signed   By: Everlean Alstrom M.D.   On: 03/11/2016 12:59    Assessment & Plan:   Gisella was seen today for herpes zoster.  Diagnoses and all orders for this visit:  Herpes zoster without complication -     valACYclovir (VALTREX) 1000 MG tablet; Take 1 tablet (1,000 mg total) by mouth 3 (three) times daily. -     gabapentin (NEURONTIN) 100 MG capsule; Take 1cap in morning, and 2cap at bedtime. -     Basic metabolic panel; Future   I have discontinued Ms. Prestage's meloxicam. I am also having her start on valACYclovir and gabapentin. Additionally, I am having her maintain her metronidazole, multivitamin with minerals, Glucosamine Sulfate, cholecalciferol, Omega-3 Fatty Acids (FISH OIL CONCENTRATE PO), PRESERVISION AREDS, irbesartan-hydrochlorothiazide, and ferrous sulfate.  Meds ordered this encounter  Medications  . valACYclovir (VALTREX) 1000 MG tablet    Sig: Take 1 tablet (1,000 mg total) by mouth 3 (three) times daily.    Dispense:  21 tablet    Refill:  0    Order Specific Question:   Supervising Provider    Answer:   Cassandria Anger [[id number]]  . gabapentin (NEURONTIN) 100 MG capsule    Sig: Take 1cap in morning, and 2cap at bedtime.    Dispense:  30 capsule    Refill:  1    Order Specific Question:   Supervising Provider    Answer:   Cassandria Anger [[id number]]    Follow-up: Return if symptoms worsen or fail to improve.  Wilfred Lacy, NP

## 2016-06-13 ENCOUNTER — Telehealth: Payer: Self-pay | Admitting: Internal Medicine

## 2016-06-13 NOTE — Telephone Encounter (Signed)
Pt called stating Baldo Ash gave her sample of Duexis to help with pain for shingle. Pt was wondering if she can get some more of that or what else she can take. Please advise.

## 2016-06-13 NOTE — Telephone Encounter (Signed)
Pt aware to pick up front.

## 2016-06-13 NOTE — Telephone Encounter (Signed)
I have 1 package left. She can stop by for it. Thank you

## 2016-06-17 ENCOUNTER — Telehealth: Payer: Self-pay | Admitting: Internal Medicine

## 2016-06-17 DIAGNOSIS — B0229 Other postherpetic nervous system involvement: Secondary | ICD-10-CM

## 2016-06-17 MED ORDER — LIDOCAINE 5 % EX PTCH: 1.0000 | MEDICATED_PATCH | CUTANEOUS | 0 refills | Status: DC

## 2016-06-17 NOTE — Telephone Encounter (Signed)
Pt seen Caroline Olson recently for shingles. She states her shingles have dried up but the pain is so much worse. She only has one of the samples of Duxis that you gave her. Should she just take tylenol Can you please give her a call at [phone]

## 2016-06-17 NOTE — Telephone Encounter (Signed)
Pt verbalized understand. Lidocaine patch send in.

## 2016-06-17 NOTE — Telephone Encounter (Signed)
Routing to [place].Please advise

## 2016-06-17 NOTE — Telephone Encounter (Signed)
Please inquire if she is taking gabapentin as prescribed. If so, she needs to increase dosage to 1cap in morning and afternoon, then 2caps at bedtime. If no improvement, make another OV appt. Thank you

## 2016-06-18 NOTE — Telephone Encounter (Signed)
Pt aware. Insurance is not cover for lidocaine patch.

## 2016-06-18 NOTE — Telephone Encounter (Signed)
Patient called back today stating that [facility] notified them that our office needs to contact the insurance company in regard to [place].  Does not know if pharmacy faxed PA over or not.  [facility] - [phone].

## 2016-06-19 NOTE — Telephone Encounter (Signed)
PA for Lidocaine patches approved through [email or web address]. Caroline Olson for patient. Notified [facility].

## 2016-06-19 NOTE — Telephone Encounter (Signed)
PA for Lidocaine patch initiated  Key: [id number]

## 2016-06-20 ENCOUNTER — Ambulatory Visit (INDEPENDENT_AMBULATORY_CARE_PROVIDER_SITE_OTHER): Payer: Medicare Other | Admitting: Nurse Practitioner

## 2016-06-20 ENCOUNTER — Encounter: Payer: Self-pay | Admitting: Nurse Practitioner

## 2016-06-20 VITALS — BP 136/70 | HR 62 | Temp 97.5°F | Ht 65.0 in | Wt 138.0 lb

## 2016-06-20 DIAGNOSIS — B0222 Postherpetic trigeminal neuralgia: Secondary | ICD-10-CM

## 2016-06-20 MED ORDER — GABAPENTIN 300 MG PO CAPS: ORAL_CAPSULE | ORAL | 1 refills | Status: DC

## 2016-06-20 MED ORDER — PREDNISONE 10 MG (21) PO TBPK: ORAL_TABLET | ORAL | 0 refills | Status: DC

## 2016-06-20 NOTE — Progress Notes (Signed)
Subjective:  Patient ID: Caroline Olson, female    DOB: [birth year]  Age: 76 y.o. MRN: [id number]  CC: Herpes Zoster (shingle not better, still pain on left abdominal. )  Abdominal Pain  This is a new problem. The current episode started 1 to 4 weeks ago. The onset quality is gradual. The problem occurs constantly. The problem has been unchanged. The pain is located in the LUQ. The quality of the pain is aching and dull. Pain radiation: left flank. Pertinent negatives include no anorexia, belching, constipation, diarrhea, dysuria, fever, flatus, hematochezia, hematuria, melena, myalgias, nausea, vomiting or weight loss. The pain is aggravated by palpation and movement. The pain is relieved by nothing. She has tried acetaminophen (lidocaine patch and gabapentin) for the symptoms. The treatment provided mild relief.  herpetic rash on left flank region has healed.  Outpatient Medications Prior to Visit  Medication Sig Dispense Refill  . cholecalciferol (VITAMIN D) 1000 UNITS tablet Take 1,000 Units by mouth daily.    . ferrous sulfate 325 (65 FE) MG tablet Take 1 tablet (325 mg total) by mouth daily. (Patient taking differently: Take 325 mg by mouth 3 (three) times a week. ) 30 tablet 3  . Glucosamine Sulfate 500 MG TABS Take by mouth 2 (two) times daily.    . irbesartan-hydrochlorothiazide (AVALIDE) 300-12.5 MG tablet Take 1 tablet by mouth daily. 90 tablet 3  . lidocaine (LIDODERM) 5 % Place 1 patch onto the skin daily. Remove & Discard patch within 12 hours or as directed by MD 14 patch 0  . metronidazole (NORITATE) 1 % cream Apply topically 2 (two) times daily.     . Multiple Vitamins-Minerals (MULTIVITAMIN WITH MINERALS) tablet Take 1 tablet by mouth 3 (three) times a week.     . Multiple Vitamins-Minerals (PRESERVISION AREDS) CAPS Take by mouth daily.    . Omega-3 Fatty Acids (FISH OIL CONCENTRATE PO) Take by mouth. 1 tsp daily    . valACYclovir (VALTREX) 1000 MG tablet Take 1 tablet (1,000 mg  total) by mouth 3 (three) times daily. 21 tablet 0  . gabapentin (NEURONTIN) 100 MG capsule Take 1cap in morning, and 2cap at bedtime. 30 capsule 1   No facility-administered medications prior to visit.     ROS See HPI  Objective:  BP 136/70   Pulse 62   Temp 97.5 F (36.4 C)   Ht 5\' 5"  (1.651 m)   Wt 138 lb (62.6 kg)   LMP 04/01/1983   SpO2 99%   BMI 22.96 kg/m   BP Readings from Last 3 Encounters:  06/20/16 136/70  06/09/16 (!) 142/74  01/24/16 (!) 160/80    Wt Readings from Last 3 Encounters:  06/20/16 138 lb (62.6 kg)  06/09/16 136 lb (61.7 kg)  01/24/16 138 lb (62.6 kg)    Physical Exam  Constitutional: No distress.  Cardiovascular: Normal rate.   Pulmonary/Chest: Effort normal.  Abdominal: Soft. Bowel sounds are normal. She exhibits no distension and no mass. There is tenderness. There is no rebound and no guarding.  Skin: Skin is warm and dry. No rash noted. No erythema.  Vitals reviewed.   Lab Results  Component Value Date   WBC 5.3 02/14/2015   HGB 12.1 02/14/2015   HCT 36.5 02/14/2015   PLT 247.0 02/14/2015   GLUCOSE 70 06/09/2016   CHOL 198 02/14/2015   TRIG 44.0 02/14/2015   HDL 80.80 02/14/2015   LDLDIRECT 104.3 06/07/2008   LDLCALC 109 (H) 02/14/2015   ALT 12 02/14/2015  AST 18 02/14/2015   NA 136 06/09/2016   K 4.5 06/09/2016   CL 100 06/09/2016   CREATININE 1.03 06/09/2016   BUN 22 06/09/2016   CO2 31 06/09/2016   TSH 0.98 02/14/2015    Mm Diag Breast Tomo Bilateral  Result Date: 03/11/2016 CLINICAL DATA:  76 year old female with history of left breast cancer post lumpectomy 2011 followed by radiation therapy. EXAM: 2D DIGITAL DIAGNOSTIC BILATERAL MAMMOGRAM WITH CAD AND ADJUNCT TOMO COMPARISON:  Previous exam(s). ACR Breast Density Category b: There are scattered areas of fibroglandular density. FINDINGS: No suspicious masses or calcifications are seen in either breast. Postsurgical changes are present in the central upper left  breast related to prior lumpectomy. Spot compression magnification MLO view of the lumpectomy site in the left breast was performed. There is no mammographic evidence of locally recurrent malignancy. Mammographic images were processed with CAD. IMPRESSION: No mammographic evidence of malignancy in either breast. RECOMMENDATION: Diagnostic mammogram is suggested in 1 year. (Code:DM-B-01Y) I have discussed the findings and recommendations with the patient. Results were also provided in writing at the conclusion of the visit. If applicable, a reminder letter will be sent to the patient regarding the next appointment. BI-RADS CATEGORY  2: Benign. Electronically Signed   By: Everlean Alstrom M.D.   On: 03/11/2016 12:59    Assessment & Plan:   Nakema was seen today for herpes zoster.  Diagnoses and all orders for this visit:  Postherpetic trigeminal neuralgia -     gabapentin (NEURONTIN) 300 MG capsule; Take 1cap in morning and 2caps at bedtime prn -     predniSONE (STERAPRED UNI-PAK 21 TAB) 10 MG (21) TBPK tablet; Take by mouth as directed.   I have discontinued Ms. Mendosa's gabapentin. I am also having her start on gabapentin and predniSONE. Additionally, I am having her maintain her metronidazole, multivitamin with minerals, Glucosamine Sulfate, cholecalciferol, Omega-3 Fatty Acids (FISH OIL CONCENTRATE PO), PRESERVISION AREDS, irbesartan-hydrochlorothiazide, ferrous sulfate, valACYclovir, and lidocaine.  Meds ordered this encounter  Medications  . gabapentin (NEURONTIN) 300 MG capsule    Sig: Take 1cap in morning and 2caps at bedtime prn    Dispense:  90 capsule    Refill:  1    Order Specific Question:   Supervising Provider    Answer:   Cassandria Anger [[id number]]  . predniSONE (STERAPRED UNI-PAK 21 TAB) 10 MG (21) TBPK tablet    Sig: Take by mouth as directed.    Dispense:  21 tablet    Refill:  0    Order Specific Question:   Supervising Provider    Answer:   Cassandria Anger [[id number]]     Follow-up: No Follow-up on file.  Wilfred Lacy, NP

## 2016-06-20 NOTE — Patient Instructions (Signed)
Please increase gabapentin dose (100mg ) 3caps in morning and 6caps at bedtime; till complete current prescription.   Start oral prednisone today.  Continue use of lidocaine patch and cold compress as needed.  Call office for lyrica prescription if no improvement in 1week with increased gabapentin dose and oral prednisone.

## 2016-06-20 NOTE — Progress Notes (Signed)
Pre visit review using our clinic review tool, if applicable. No additional management support is needed unless otherwise documented below in the visit note. 

## 2016-07-02 ENCOUNTER — Telehealth: Payer: Self-pay

## 2016-07-02 NOTE — Telephone Encounter (Signed)
Received a fax from [place].   Called pt and she is feeling much better. Pt stated that she was able to pick up the lidocaine patches.

## 2016-07-22 ENCOUNTER — Ambulatory Visit: Payer: Medicare Other | Admitting: Internal Medicine

## 2016-08-01 NOTE — Progress Notes (Signed)
Pre visit review using our clinic review tool, if applicable. No additional management support is needed unless otherwise documented below in the visit note. 

## 2016-08-01 NOTE — Progress Notes (Addendum)
Subjective:   Caroline Olson is a 76 y.o. female who presents for Medicare Annual (Subsequent) preventive examination.  Review of Systems:  No ROS.  Medicare Wellness Visit.  Cardiac Risk Factors include: advanced age (>60men, >38 women);dyslipidemia;hypertension Sleep patterns: no sleep issues, feels rested on waking, gets up 1 times nightly to void and sleeps 9 99hours nightly.   Home Safety/Smoke Alarms: Feels safe in home. Smoke alarms in place.   Living environment; residence and Firearm Safety: 2-story house, no firearms. Lives with husband, no needs for DME Seat Belt Safety/Bike Helmet: Wears seat belt.   Counseling:   Eye Exam- appointment yearly Dental- appointment every 6 months  Female:   Pap- N/A       Mammo- Last 03/11/16, BI-RADS CATEGORY  2: Benign, patient states Dr. Sabra Heck will order during her upcoming visit.       Dexa scan-  Last 03/06/14, osteopenia, patient states Dr. Sabra Heck will order during her upcoming visit.     CCS- Last 09/10/12, precancerous polyps, recall 5 yeas     Objective:     Vitals: BP 126/72   Pulse (!) 58   Resp 20   Ht 5\' 5"  (1.651 m)   Wt 133 lb (60.3 kg)   LMP 04/01/1983   SpO2 99%   BMI 22.13 kg/m   Body mass index is 22.13 kg/m.   Tobacco History  Smoking Status  . Never Smoker  Smokeless Tobacco  . Never Used     Counseling given: Not Answered   Past Medical History:  Diagnosis Date  . Breast cancer ([place]) 2011   DCIF  . Osteopenia   . Varicose veins    Past Surgical History:  Procedure Laterality Date  . ABDOMINAL HYSTERECTOMY  1985  . APPENDECTOMY  1956   pt states appendix was on left side not rt. per surgeon  . BREAST LUMPECTOMY  2011   left, reexcision 1/12  . ENDOVENOUS ABLATION SAPHENOUS VEIN W/ LASER Left 01-27-2013   left greater saphenous vein and sclerotherapy left leg by Curt Jews MD  . ENDOVENOUS ABLATION SAPHENOUS VEIN W/ LASER Right 02-10-2013   right greater saphenous vein and sclerotherapy  right leg by Curt Jews MD  . ROTATOR CUFF REPAIR Right 2009  . SHOULDER SURGERY  2003   tear  . stab phlebectomy Left 07-21-2013   10-20 incisions left leg by Curt Jews MD  . TRICEPS TENDON REPAIR  2009   Family History  Problem Relation Age of Onset  . Cancer Mother   . Hypertension Mother   . Other Mother     varicose veins  . Heart disease Father   . Diabetes Father   . Colon cancer Neg Hx    History  Sexual Activity  . Sexual activity: Yes  . Partners: Male  . Birth control/ protection: Post-menopausal    Comment: TAH/BSO    Outpatient Encounter Prescriptions as of 08/04/2016  Medication Sig  . cholecalciferol (VITAMIN D) 1000 UNITS tablet Take 1,000 Units by mouth daily.  . ferrous sulfate 325 (65 FE) MG tablet Take 1 tablet (325 mg total) by mouth daily. (Patient taking differently: Take 325 mg by mouth 3 (three) times a week. )  . Glucosamine Sulfate 500 MG TABS Take by mouth 2 (two) times daily.  . irbesartan-hydrochlorothiazide (AVALIDE) 300-12.5 MG tablet Take 1 tablet by mouth daily.  . metronidazole (NORITATE) 1 % cream Apply topically 2 (two) times daily.   . Multiple Vitamins-Minerals (MULTIVITAMIN WITH MINERALS) tablet  Take 1 tablet by mouth 3 (three) times a week.   . Multiple Vitamins-Minerals (PRESERVISION AREDS) CAPS Take by mouth daily.  . Omega-3 Fatty Acids (FISH OIL CONCENTRATE PO) Take by mouth. 1 tsp daily  . [DISCONTINUED] gabapentin (NEURONTIN) 300 MG capsule Take 1cap in morning and 2caps at bedtime prn (Patient not taking: Reported on 08/04/2016)  . [DISCONTINUED] lidocaine (LIDODERM) 5 % Place 1 patch onto the skin daily. Remove & Discard patch within 12 hours or as directed by MD (Patient not taking: Reported on 08/04/2016)  . [DISCONTINUED] predniSONE (STERAPRED UNI-PAK 21 TAB) 10 MG (21) TBPK tablet Take by mouth as directed. (Patient not taking: Reported on 08/04/2016)  . [DISCONTINUED] valACYclovir (VALTREX) 1000 MG tablet Take 1 tablet (1,000 mg  total) by mouth 3 (three) times daily. (Patient not taking: Reported on 08/04/2016)   No facility-administered encounter medications on file as of 08/04/2016.     Activities of Daily Living In your present state of health, do you have any difficulty performing the following activities: 08/04/2016  Hearing? N  Vision? N  Difficulty concentrating or making decisions? N  Walking or climbing stairs? N  Dressing or bathing? N  Doing errands, shopping? N  Preparing Food and eating ? N  Using the Toilet? N  In the past six months, have you accidently leaked urine? Y  Do you have problems with loss of bowel control? N  Managing your Medications? N  Managing your Finances? N  Housekeeping or managing your Housekeeping? N  Some recent data might be hidden    Patient Care Team: Plotnikov, Evie Lacks, MD as PCP - General Magrinat, Virgie Dad, MD (Hematology and Oncology) Coralie Keens, MD (General Surgery) Megan Salon, MD as Attending Physician (Gynecology) Gavin Pound, MD as Consulting Physician (Rheumatology) Almedia Balls, MD as Consulting Physician (Orthopedic Surgery)    Assessment:    Physical assessment deferred to PCP.  Exercise Activities and Dietary recommendations Current Exercise Habits: Home exercise routine, Type of exercise: walking;stretching;calisthenics;strength training/weights, Time (Minutes): 50, Frequency (Times/Week): 5, Weekly Exercise (Minutes/Week): 250, Intensity: Moderate, Exercise limited by: None identified  Diet (meal preparation, eat out, water intake, caffeinated beverages, dairy products, fruits and vegetables): in general, a "healthy" diet  , well balanced, low fat/ cholesterol, low salt eats a variety of fruits and vegetables daily, limits salt, fat/cholesterol, sugar, caffeine, drinks 6-8 glasses of water daily.  Goals    . exercise           Start to work out with light weights to improve my physical health. Do a little more each day. Improve my  upper body strength    . Exercise 150 minutes per week (moderate activity)          Will add low weight bearing exercises 2 times a week       Fall Risk Fall Risk  08/04/2016 06/20/2016 03/07/2016 02/05/2015  Falls in the past year? Yes Yes No No  Number falls in past yr: 2 or more 2 or more - -  Injury with Fall? No No - -   Depression Screen PHQ 2/9 Scores 08/04/2016 06/20/2016 02/05/2015  PHQ - 2 Score 0 0 0    Cognitive Function       Ad8 score reviewed for issues:  Issues making decisions: no  Less interest in hobbies / activities: no  Repeats questions, stories (family complaining): no  Trouble using ordinary gadgets (microwave, computer, phone): no  Forgets the month or year: no  Mismanaging finances:  no  Remembering appts: no  Daily problems with thinking and/or memory: no Ad8 score is= 0  Immunization History  Administered Date(s) Administered  . Influenza Split 02/13/2015  . Influenza-Unspecified 01/18/2013, 01/12/2014, 01/30/2016  . Pneumococcal Conjugate-13 01/25/2014  . Pneumococcal Polysaccharide-23 07/08/2009  . Td 05/30/2003  . Tdap 06/30/2013  . Zoster 06/07/2008   Screening Tests Health Maintenance  Topic Date Due  . INFLUENZA VACCINE  10/29/2016  . COLONOSCOPY  09/10/2017  . TETANUS/TDAP  07/01/2023  . DEXA SCAN  Completed  . PNA vac Low Risk Adult  Completed      Plan:    Continue to eat heart healthy diet (full of fruits, vegetables, whole grains, lean protein, water--limit salt, fat, and sugar intake) and increase physical activity as tolerated.  Continue doing brain stimulating activities (puzzles, reading, adult coloring books, staying active) to keep memory sharp.   I have personally reviewed and noted the following in the patient's chart:   . Medical and social history . Use of alcohol, tobacco or illicit drugs  . Current medications and supplements . Functional ability and status . Nutritional status . Physical  activity . Advanced directives . List of other physicians . Hospitalizations, surgeries, and ER visits in previous 12 months . Vitals . Screenings to include cognitive, depression, and falls . Referrals and appointments  In addition, I have reviewed and discussed with patient certain preventive protocols, quality metrics, and best practice recommendations. A written personalized care plan for preventive services as well as general preventive health recommendations were provided to patient.     Michiel Cowboy, RN  08/04/2016   Medical screening examination/treatment/procedure(s) were performed by non-physician practitioner and as supervising physician I was immediately available for consultation/collaboration. I agree with above. Walker Kehr, MD

## 2016-08-04 ENCOUNTER — Ambulatory Visit (INDEPENDENT_AMBULATORY_CARE_PROVIDER_SITE_OTHER): Payer: Medicare Other | Admitting: *Deleted

## 2016-08-04 VITALS — BP 126/72 | HR 58 | Resp 20 | Ht 65.0 in | Wt 133.0 lb

## 2016-08-04 DIAGNOSIS — Z0000 Encounter for general adult medical examination without abnormal findings: Secondary | ICD-10-CM | POA: Diagnosis not present

## 2016-08-04 NOTE — Patient Instructions (Addendum)
Continue to eat heart healthy diet (full of fruits, vegetables, whole grains, lean protein, water--limit salt, fat, and sugar intake) and increase physical activity as tolerated.  Continue doing brain stimulating activities (puzzles, reading, adult coloring books, staying active) to keep memory sharp.    Caroline Olson , Thank you for taking time to come for your Medicare Wellness Visit. I appreciate your ongoing commitment to your health goals. Please review the following plan we discussed and let me know if I can assist you in the future.   These are the goals we discussed: Goals    . exercise           Start to work out with light weights to improve my physical health. Do a little more each day. Improve my upper body strength    . Exercise 150 minutes per week (moderate activity)          Will add low weight bearing exercises 2 times a week        This is a list of the screening recommended for you and due dates:  Health Maintenance  Topic Date Due  . Flu Shot  10/29/2016  . Colon Cancer Screening  09/10/2017  . Tetanus Vaccine  07/01/2023  . DEXA scan (bone density measurement)  Completed  . Pneumonia vaccines  Completed    High-Fiber Diet Fiber, also called dietary fiber, is a type of carbohydrate found in fruits, vegetables, whole grains, and beans. A high-fiber diet can have many health benefits. Your health care provider may recommend a high-fiber diet to help:  Prevent constipation. Fiber can make your bowel movements more regular.  Lower your cholesterol.  Relieve hemorrhoids, uncomplicated diverticulosis, or irritable bowel syndrome.  Prevent overeating as part of a weight-loss plan.  Prevent heart disease, type 2 diabetes, and certain cancers. What is my plan? The recommended daily intake of fiber includes:  38 grams for men under age 49.  52 grams for men over age 17.  16 grams for women under age 61.  42 grams for women over age 37. You can get the  recommended daily intake of dietary fiber by eating a variety of fruits, vegetables, grains, and beans. Your health care provider may also recommend a fiber supplement if it is not possible to get enough fiber through your diet. What do I need to know about a high-fiber diet?  Fiber supplements have not been widely studied for their effectiveness, so it is better to get fiber through food sources.  Always check the fiber content on thenutrition facts label of any prepackaged food. Look for foods that contain at least 5 grams of fiber per serving.  Ask your dietitian if you have questions about specific foods that are related to your condition, especially if those foods are not listed in the following section.  Increase your daily fiber consumption gradually. Increasing your intake of dietary fiber too quickly may cause bloating, cramping, or gas.  Drink plenty of water. Water helps you to digest fiber. What foods can I eat? Grains  Whole-grain breads. Multigrain cereal. Oats and oatmeal. Brown rice. Barley. Bulgur wheat. [place]. Bran muffins. Popcorn. Rye wafer crackers. Vegetables  Sweet potatoes. Spinach. Kale. Artichokes. Cabbage. Broccoli. Green peas. Carrots. Squash. Fruits  Berries. Pears. Apples. Oranges. Avocados. Prunes and raisins. Dried figs. Meats and Other Protein Sources  [place], kidney, pinto, and soy beans. Split peas. Lentils. Nuts and seeds. Dairy  Fiber-fortified yogurt. Beverages  Fiber-fortified soy milk. Fiber-fortified orange juice. Other  Fiber  bars. The items listed above may not be a complete list of recommended foods or beverages. Contact your dietitian for more options.  What foods are not recommended? Grains  White bread. Pasta made with refined flour. White rice. Vegetables  Fried potatoes. Canned vegetables. Well-cooked vegetables. Fruits  Fruit juice. Cooked, strained fruit. Meats and Other Protein Sources  Fatty cuts of meat. Fried Sales executive or fried  fish. Dairy  Milk. Yogurt. Cream cheese. Sour cream. Beverages  Soft drinks. Other  Cakes and pastries. Butter and oils. The items listed above may not be a complete list of foods and beverages to avoid. Contact your dietitian for more information.  What are some tips for including high-fiber foods in my diet?  Eat a wide variety of high-fiber foods.  Make sure that half of all grains consumed each day are whole grains.  Replace breads and cereals made from refined flour or white flour with whole-grain breads and cereals.  Replace white rice with brown rice, bulgur wheat, or millet.  Start the day with a breakfast that is high in fiber, such as a cereal that contains at least 5 grams of fiber per serving.  Use beans in place of meat in soups, salads, or pasta.  Eat high-fiber snacks, such as berries, raw vegetables, nuts, or popcorn. This information is not intended to replace advice given to you by your health care provider. Make sure you discuss any questions you have with your health care provider. Document Released: 03/17/2005 Document Revised: 08/23/2015 Document Reviewed: 08/30/2013 Elsevier Interactive Patient Education  2017 [employer].

## 2016-08-22 ENCOUNTER — Other Ambulatory Visit: Payer: Self-pay | Admitting: *Deleted

## 2016-08-22 MED ORDER — ZOSTER VAC RECOMB ADJUVANTED 50 MCG/0.5ML IM SUSR: INTRAMUSCULAR | 1 refills | Status: DC

## 2016-08-22 NOTE — Telephone Encounter (Signed)
Called patient to inform that Shingrix vaccination prescription has been sent to her pharmacy.

## 2016-09-12 DIAGNOSIS — M25511 Pain in right shoulder: Secondary | ICD-10-CM | POA: Diagnosis not present

## 2016-09-12 DIAGNOSIS — Z6822 Body mass index (BMI) 22.0-22.9, adult: Secondary | ICD-10-CM | POA: Diagnosis not present

## 2016-09-12 DIAGNOSIS — M064 Inflammatory polyarthropathy: Secondary | ICD-10-CM | POA: Diagnosis not present

## 2016-09-12 DIAGNOSIS — M25561 Pain in right knee: Secondary | ICD-10-CM | POA: Diagnosis not present

## 2016-10-03 DIAGNOSIS — I83892 Varicose veins of left lower extremities with other complications: Secondary | ICD-10-CM | POA: Diagnosis not present

## 2016-10-03 DIAGNOSIS — L821 Other seborrheic keratosis: Secondary | ICD-10-CM | POA: Diagnosis not present

## 2016-10-06 ENCOUNTER — Telehealth: Payer: Self-pay | Admitting: Gastroenterology

## 2016-10-06 NOTE — Telephone Encounter (Signed)
Please offer an appt with an APP

## 2016-10-10 ENCOUNTER — Other Ambulatory Visit: Payer: Self-pay | Admitting: Internal Medicine

## 2016-12-31 NOTE — Progress Notes (Signed)
76 y.o. G2P2 Married Caucasian F here for annual exam.  Doing well.  Had shingles in March.  Still having some skin pain/irritation.  Has started the Shingrix vaccination.  Denies vaginal bleeding.  Does report she's had rectal bleeding earlier in the year.  Was bright red and a lot in the toilet.  Called GI but no one every called her back.    Still taking iron.  Would like to know if she can stop this.  Hasn't had blood work done in about two years except with Dr. Trudie Reed.    Patient's last menstrual period was 04/01/1983.          Sexually active: Yes.    The current method of family planning is status post hysterectomy.    Exercising: Yes.    walking and hiking Smoker:  no  Health Maintenance: Pap:  2012 negative  History of abnormal Pap:  no MMG:  03/11/16  BIRADS 2 benign  Colonoscopy:  09/10/12 polyp- repeat 10 years  BMD:   03/06/14 osteopenia  TDaP:  06/30/13  Pneumonia vaccine(s):  07/08/09, 01/25/14  Zostavax:   06/07/08, 11/11/16 (Shingrix)  Hep C testing: not indicated  Screening Labs: discuss today   reports that she has never smoked. She has never used smokeless tobacco. She reports that she drinks about 3.0 oz of alcohol per week . She reports that she does not use drugs.  Past Medical History:  Diagnosis Date  . Breast cancer ([place]) 2011   DCIF  . Osteopenia   . Varicose veins     Past Surgical History:  Procedure Laterality Date  . ABDOMINAL HYSTERECTOMY  1985  . APPENDECTOMY  1956   pt states appendix was on left side not rt. per surgeon  . BREAST LUMPECTOMY  2011   left, reexcision 1/12  . ENDOVENOUS ABLATION SAPHENOUS VEIN W/ LASER Left 01-27-2013   left greater saphenous vein and sclerotherapy left leg by Curt Jews MD  . ENDOVENOUS ABLATION SAPHENOUS VEIN W/ LASER Right 02-10-2013   right greater saphenous vein and sclerotherapy right leg by Curt Jews MD  . ROTATOR CUFF REPAIR Right 2009  . SHOULDER SURGERY  2003   tear  . stab phlebectomy Left  07-21-2013   10-20 incisions left leg by Curt Jews MD  . TRICEPS TENDON REPAIR  2009    Current Outpatient Prescriptions  Medication Sig Dispense Refill  . cholecalciferol (VITAMIN D) 1000 UNITS tablet Take 1,000 Units by mouth daily.    . ferrous sulfate 325 (65 FE) MG tablet Take 1 tablet (325 mg total) by mouth daily. (Patient taking differently: Take 325 mg by mouth 3 (three) times a week. ) 30 tablet 3  . Glucosamine Sulfate 500 MG TABS Take by mouth 2 (two) times daily.    . irbesartan-hydrochlorothiazide (AVALIDE) 300-12.5 MG tablet Take 1 tablet by mouth daily. Annual appt w/labs due in Oct must see MD for refills 90 tablet 0  . metronidazole (NORITATE) 1 % cream Apply topically 2 (two) times daily.     . Multiple Vitamins-Minerals (MULTIVITAMIN WITH MINERALS) tablet Take 1 tablet by mouth 3 (three) times a week.     . Multiple Vitamins-Minerals (PRESERVISION AREDS) CAPS Take by mouth daily.    . Omega-3 Fatty Acids (FISH OIL CONCENTRATE PO) Take by mouth. 1 tsp daily     No current facility-administered medications for this visit.     Family History  Problem Relation Age of Onset  . Cancer Mother   . Hypertension  Mother   . Other Mother        varicose veins  . Heart disease Father   . Diabetes Father   . Colon cancer Neg Hx     ROS:  Pertinent items are noted in HPI.  Otherwise, a comprehensive ROS was negative.  Exam:   BP 118/70 (BP Location: Right Arm, Patient Position: Sitting, Cuff Size: Normal)   Pulse 72   Resp 16   Ht 5' 4.25" (1.632 m)   Wt 136 lb (61.7 kg)   LMP 04/01/1983   BMI 23.16 kg/m   Weight:  +4# Height: 5' 4.25" (163.2 cm)  Ht Readings from Last 3 Encounters:  01/01/17 5' 4.25" (1.632 m)  08/04/16 5\' 5"  (1.651 m)  06/20/16 5\' 5"  (1.651 m)    General appearance: alert, cooperative and appears stated age Head: Normocephalic, without obvious abnormality, atraumatic Neck: no adenopathy, supple, symmetrical, trachea midline and thyroid normal  to inspection and palpation Lungs: clear to auscultation bilaterally Breasts: right breast without masses, skin changes, nipple discharge, left breast s/p lumpectomy with stable radiation changes, no masses, no LAD Heart: regular rate and rhythm Abdomen: soft, non-tender; bowel sounds normal; no masses,  no organomegaly Extremities: extremities normal, atraumatic, no cyanosis or edema Skin: Skin color, texture, turgor normal. No rashes or lesions Lymph nodes: Cervical, supraclavicular, and axillary nodes normal. No abnormal inguinal nodes palpated Neurologic: Grossly normal   Pelvic: External genitalia:  no lesions              Urethra:  normal appearing urethra with no masses, tenderness or lesions              Bartholins and Skenes: normal                 Vagina: normal appearing vagina with normal color and discharge, no lesions              Cervix: absent              Pap taken: No. Bimanual Exam:  Uterus:  uterus absent              Adnexa: no mass, fullness, tenderness               Rectovaginal: Confirms               Anus:  normal sphincter tone, no lesions, significant external hemorrhoids [place]: + today  Chaperone was present for exam.  A:  Well Woman with normal exam PMP, no HRT H/O DCIS 12/11 s/p lumpectomy and radiation.  Has been released by oncology. Osteopenia SUI, that improved with pelvic PT H/O TAH H/O anemia.  On iron TID.  Would like to stop if possible.  Colonoscopy 2014. Stool + for blood today Hemorrhoids  P:   Mammogram guidelines reviewed.  Pt doing yearly MMG. pap smear not indicated CBC, CMP, lipids, Vit D, iron profile obtained today Referral will be made to Dr. Carlean Purl for colonoscopy Return annually or prn

## 2017-01-01 ENCOUNTER — Encounter: Payer: Self-pay | Admitting: Obstetrics & Gynecology

## 2017-01-01 ENCOUNTER — Ambulatory Visit (INDEPENDENT_AMBULATORY_CARE_PROVIDER_SITE_OTHER): Payer: Medicare Other | Admitting: Obstetrics & Gynecology

## 2017-01-01 ENCOUNTER — Telehealth: Payer: Self-pay | Admitting: Internal Medicine

## 2017-01-01 VITALS — BP 118/70 | HR 72 | Resp 16 | Ht 64.25 in | Wt 136.0 lb

## 2017-01-01 DIAGNOSIS — E559 Vitamin D deficiency, unspecified: Secondary | ICD-10-CM | POA: Diagnosis not present

## 2017-01-01 DIAGNOSIS — E7800 Pure hypercholesterolemia, unspecified: Secondary | ICD-10-CM

## 2017-01-01 DIAGNOSIS — R944 Abnormal results of kidney function studies: Secondary | ICD-10-CM | POA: Diagnosis not present

## 2017-01-01 DIAGNOSIS — K648 Other hemorrhoids: Secondary | ICD-10-CM | POA: Diagnosis not present

## 2017-01-01 DIAGNOSIS — D508 Other iron deficiency anemias: Secondary | ICD-10-CM | POA: Diagnosis not present

## 2017-01-01 DIAGNOSIS — Z01419 Encounter for gynecological examination (general) (routine) without abnormal findings: Secondary | ICD-10-CM

## 2017-01-01 DIAGNOSIS — R195 Other fecal abnormalities: Secondary | ICD-10-CM

## 2017-01-01 DIAGNOSIS — I10 Essential (primary) hypertension: Secondary | ICD-10-CM | POA: Diagnosis not present

## 2017-01-01 DIAGNOSIS — K625 Hemorrhage of anus and rectum: Secondary | ICD-10-CM

## 2017-01-01 LAB — HEMOCCULT GUIAC POC 1CARD (OFFICE): FECAL OCCULT BLD: POSITIVE — AB

## 2017-01-01 NOTE — Addendum Note (Signed)
Addended by: Terence Lux A on: 01/01/2017 11:46 AM   Modules accepted: Orders

## 2017-01-01 NOTE — Telephone Encounter (Signed)
Yes. I will see her.

## 2017-01-02 LAB — LIPID PANEL
CHOL/HDL RATIO: 2.7 ratio (ref 0.0–4.4)
Cholesterol, Total: 212 mg/dL — ABNORMAL HIGH (ref 100–199)
HDL: 80 mg/dL (ref >39)
LDL Calculated: 117 mg/dL — ABNORMAL HIGH (ref 0–99)
Triglycerides: 77 mg/dL (ref 0–149)
VLDL Cholesterol Cal: 15 mg/dL (ref 5–40)

## 2017-01-02 LAB — COMPREHENSIVE METABOLIC PANEL
A/G RATIO: 1.9 (ref 1.2–2.2)
ALK PHOS: 91 IU/L (ref 39–117)
ALT: 17 IU/L (ref 0–32)
AST: 19 IU/L (ref 0–40)
Albumin: 4.7 g/dL (ref 3.5–4.8)
BILIRUBIN TOTAL: 0.3 mg/dL (ref 0.0–1.2)
BUN/Creatinine Ratio: 19 (ref 12–28)
BUN: 21 mg/dL (ref 8–27)
CO2: 23 mmol/L (ref 20–29)
CREATININE: 1.08 mg/dL — AB (ref 0.57–1.00)
Calcium: 10.1 mg/dL (ref 8.7–10.3)
Chloride: 101 mmol/L (ref 96–106)
GFR calc Af Amer: 57 mL/min/{1.73_m2} — ABNORMAL LOW (ref >59)
GFR calc non Af Amer: 49 mL/min/{1.73_m2} — ABNORMAL LOW (ref >59)
GLOBULIN, TOTAL: 2.5 g/dL (ref 1.5–4.5)
Glucose: 97 mg/dL (ref 65–99)
POTASSIUM: 4.8 mmol/L (ref 3.5–5.2)
SODIUM: 140 mmol/L (ref 134–144)
Total Protein: 7.2 g/dL (ref 6.0–8.5)

## 2017-01-02 LAB — CBC
Hematocrit: 32.2 % — ABNORMAL LOW (ref 34.0–46.6)
Hemoglobin: 11 g/dL — ABNORMAL LOW (ref 11.1–15.9)
MCH: 30.6 pg (ref 26.6–33.0)
MCHC: 34.2 g/dL (ref 31.5–35.7)
MCV: 89 fL (ref 79–97)
PLATELETS: 241 10*3/uL (ref 150–379)
RBC: 3.6 x10E6/uL — AB (ref 3.77–5.28)
RDW: 12.4 % (ref 12.3–15.4)
WBC: 6.1 10*3/uL (ref 3.4–10.8)

## 2017-01-02 LAB — IRON AND TIBC
IRON SATURATION: 33 % (ref 15–55)
IRON: 95 ug/dL (ref 27–139)
Total Iron Binding Capacity: 289 ug/dL (ref 250–450)
UIBC: 194 ug/dL (ref 118–369)

## 2017-01-02 LAB — VITAMIN D 25 HYDROXY (VIT D DEFICIENCY, FRACTURES): Vit D, 25-Hydroxy: 41.8 ng/mL (ref 30.0–100.0)

## 2017-01-05 ENCOUNTER — Other Ambulatory Visit: Payer: Self-pay | Admitting: Internal Medicine

## 2017-01-05 NOTE — Addendum Note (Signed)
Addended by: Megan Salon on: 01/05/2017 11:54 PM   Modules accepted: Orders

## 2017-01-08 DIAGNOSIS — Z6822 Body mass index (BMI) 22.0-22.9, adult: Secondary | ICD-10-CM | POA: Diagnosis not present

## 2017-01-08 DIAGNOSIS — M25561 Pain in right knee: Secondary | ICD-10-CM | POA: Diagnosis not present

## 2017-01-08 DIAGNOSIS — G5702 Lesion of sciatic nerve, left lower limb: Secondary | ICD-10-CM | POA: Diagnosis not present

## 2017-01-08 DIAGNOSIS — M0609 Rheumatoid arthritis without rheumatoid factor, multiple sites: Secondary | ICD-10-CM | POA: Diagnosis not present

## 2017-02-05 ENCOUNTER — Telehealth: Payer: Self-pay | Admitting: *Deleted

## 2017-02-05 NOTE — Telephone Encounter (Signed)
Called patient back in regards to her question of how many units of vitamin D she should take daily. Dr. Alain Marion stated 2000 units. Patient verbalized understanding.

## 2017-02-12 ENCOUNTER — Other Ambulatory Visit: Payer: Self-pay | Admitting: Obstetrics & Gynecology

## 2017-02-12 DIAGNOSIS — Z853 Personal history of malignant neoplasm of breast: Secondary | ICD-10-CM

## 2017-02-23 DIAGNOSIS — H5212 Myopia, left eye: Secondary | ICD-10-CM | POA: Diagnosis not present

## 2017-02-23 DIAGNOSIS — H524 Presbyopia: Secondary | ICD-10-CM | POA: Diagnosis not present

## 2017-02-23 DIAGNOSIS — L719 Rosacea, unspecified: Secondary | ICD-10-CM | POA: Diagnosis not present

## 2017-02-23 DIAGNOSIS — H52221 Regular astigmatism, right eye: Secondary | ICD-10-CM | POA: Diagnosis not present

## 2017-02-23 DIAGNOSIS — H52222 Regular astigmatism, left eye: Secondary | ICD-10-CM | POA: Diagnosis not present

## 2017-02-23 DIAGNOSIS — H01001 Unspecified blepharitis right upper eyelid: Secondary | ICD-10-CM | POA: Diagnosis not present

## 2017-02-23 DIAGNOSIS — H353131 Nonexudative age-related macular degeneration, bilateral, early dry stage: Secondary | ICD-10-CM | POA: Diagnosis not present

## 2017-02-23 DIAGNOSIS — H04123 Dry eye syndrome of bilateral lacrimal glands: Secondary | ICD-10-CM | POA: Diagnosis not present

## 2017-02-27 ENCOUNTER — Other Ambulatory Visit (INDEPENDENT_AMBULATORY_CARE_PROVIDER_SITE_OTHER): Payer: Medicare Other

## 2017-02-27 ENCOUNTER — Encounter: Payer: Self-pay | Admitting: Internal Medicine

## 2017-02-27 ENCOUNTER — Ambulatory Visit (INDEPENDENT_AMBULATORY_CARE_PROVIDER_SITE_OTHER): Payer: Medicare Other | Admitting: Internal Medicine

## 2017-02-27 VITALS — BP 118/64 | HR 68 | Ht 65.0 in | Wt 138.0 lb

## 2017-02-27 DIAGNOSIS — D649 Anemia, unspecified: Secondary | ICD-10-CM | POA: Diagnosis not present

## 2017-02-27 DIAGNOSIS — K649 Unspecified hemorrhoids: Secondary | ICD-10-CM

## 2017-02-27 LAB — CBC WITH DIFFERENTIAL/PLATELET
BASOS ABS: 0 10*3/uL (ref 0.0–0.1)
Basophils Relative: 0.5 % (ref 0.0–3.0)
EOS PCT: 1.6 % (ref 0.0–5.0)
Eosinophils Absolute: 0.1 10*3/uL (ref 0.0–0.7)
HEMATOCRIT: 31.5 % — AB (ref 36.0–46.0)
HEMOGLOBIN: 10.7 g/dL — AB (ref 12.0–15.0)
LYMPHS ABS: 1.1 10*3/uL (ref 0.7–4.0)
LYMPHS PCT: 18.7 % (ref 12.0–46.0)
MCHC: 33.9 g/dL (ref 30.0–36.0)
MCV: 91.8 fl (ref 78.0–100.0)
Monocytes Absolute: 0.5 10*3/uL (ref 0.1–1.0)
Monocytes Relative: 7.7 % (ref 3.0–12.0)
NEUTROS PCT: 71.5 % (ref 43.0–77.0)
Neutro Abs: 4.2 10*3/uL (ref 1.4–7.7)
Platelets: 250 10*3/uL (ref 150.0–400.0)
RBC: 3.43 Mil/uL — AB (ref 3.87–5.11)
RDW: 13.2 % (ref 11.5–15.5)
WBC: 5.9 10*3/uL (ref 4.0–10.5)

## 2017-02-27 LAB — FERRITIN: FERRITIN: 45.2 ng/mL (ref 10.0–291.0)

## 2017-02-27 NOTE — Patient Instructions (Signed)
  Your physician has requested that you go to the basement for the following lab work before leaving today: CBC/diff, Ferritin   I appreciate the opportunity to care for you. Silvano Rusk, MD, [facility]

## 2017-02-27 NOTE — Progress Notes (Signed)
Caroline Olson 76 y.o. 26-Oct-1940 [id number]  Assessment & Plan:   Encounter Diagnoses  Name Primary?  . Bleeding hemorrhoids Yes  . Mild chronic anemia     The history and examination today are consistent with her having bleeding from hemorrhoids.  I think the heme positive stool Could have been from digital irritation of the hemorrhoid.  She has had a microcytic anemia and her iron saturation is actually normal so I wonder about some component of a chronic disease anemia perhaps.  She is due for a routine repeat colonoscopy next year having had some small adenomatous polyps in 2014.  I am going to check a CBC and a ferritin.  If she looks like she is iron deficient I will encourage her to have a colonoscopy.  She was not inclined to proceed with one now and I think that is most likely okay.  I appreciate the opportunity to care for this patient. CC: Plotnikov, Evie Lacks, MD Dr. Edwinna Areola  Subjective:   Chief Complaint: Rectal bleeding  HPI The patient is a very nice elderly white woman with a history of diminutive adenomatous colon polyp removed in 2014, previously followed by Dr. Delfin Edis who developed problems with rectal bleeding this summer.  This is not a new thing she has had it off and on some over the years and she her hemorrhoids were swollen irritated she had some bleeding.  It was more than she typically sees but then it subsided and there was a occasional or rare occurrence off and on.  Her bowel habits can vary from loose to firm but that is not new.  She saw Dr. Sabra Heck in October and on examination a digital exam demonstrated a heme positive stool.  There was some rectal bleeding in the week prior to that she thinks. Allergies  Allergen Reactions  . Aspirin Other (See Comments)    REACTION: ringing in ears  . Codeine Sulfate Nausea And Vomiting   Current Meds  Medication Sig  . cholecalciferol (VITAMIN D) 1000 UNITS tablet Take 2,000 Units by mouth daily.    . Glucosamine Sulfate 500 MG TABS Take by mouth 2 (two) times daily.  . irbesartan-hydrochlorothiazide (AVALIDE) 300-12.5 MG tablet Take 1 tablet by mouth daily.  . metronidazole (NORITATE) 1 % cream Apply topically 2 (two) times daily.   . Multiple Vitamins-Minerals (MULTIVITAMIN WITH MINERALS) tablet Take 1 tablet by mouth 3 (three) times a week.   . Multiple Vitamins-Minerals (PRESERVISION AREDS) CAPS Take by mouth daily.  . Omega-3 Fatty Acids (FISH OIL CONCENTRATE PO) Take by mouth. 1 tsp daily  . [DISCONTINUED] ferrous sulfate 325 (65 FE) MG tablet Take 1 tablet (325 mg total) by mouth daily. (Patient taking differently: Take 325 mg by mouth 3 (three) times a week. )   Past Medical History:  Diagnosis Date  . Adenomatous colon polyp 2014  . Breast cancer ([place]) 2011   DCIF  . Osteopenia   . Varicose veins    Past Surgical History:  Procedure Laterality Date  . ABDOMINAL HYSTERECTOMY  1985  . APPENDECTOMY  1956   pt states appendix was on left side not rt. per surgeon  . BREAST LUMPECTOMY  2011   left, reexcision 1/12  . COLONOSCOPY W/ POLYPECTOMY  2014  . ENDOVENOUS ABLATION SAPHENOUS VEIN W/ LASER Left 01-27-2013   left greater saphenous vein and sclerotherapy left leg by Curt Jews MD  . ENDOVENOUS ABLATION SAPHENOUS VEIN W/ LASER Right 02-10-2013   right  greater saphenous vein and sclerotherapy right leg by Curt Jews MD  . ROTATOR CUFF REPAIR Right 2009  . SHOULDER SURGERY  2003   tear  . stab phlebectomy Left 07-21-2013   10-20 incisions left leg by Curt Jews MD  . [place]  2009   Social History   Social History Narrative   Patient is married she is retired and has 2 daughters   Some alcohol use no tobacco or drug use   family history includes Cancer in her mother; Diabetes in her father; Heart disease in her father; Hypertension in her mother; Other in her mother.   Review of Systems As per HPI.  All other review of systems are listed as  negative in the review form.  Objective:   Physical Exam BP 118/64   Pulse 68   Ht 5\' 5"  (1.651 m)   Wt 138 lb (62.6 kg)   LMP 04/01/1983   BMI 22.96 kg/m  NAD The eyes are anicteric  Pricilla Riffle, LPN present  Rectal - multiple small anal tags No mass - firm brown stool  Anoscopy mildly inflamed Gr 1 external hemorrhoids - esp LL  She is alert and oriented x3 and has an appropriate mood and affect   Data:  CBC Latest Ref Rng & Units 02/27/2017 01/01/2017 02/14/2015  WBC 4.0 - 10.5 K/uL 5.9 6.1 5.3  Hemoglobin 12.0 - 15.0 g/dL 10.7(L) 11.0(L) 12.1  Hematocrit 36.0 - 46.0 % 31.5(L) 32.2(L) 36.5  Platelets 150.0 - 400.0 K/uL 250.0 241 247.0

## 2017-03-02 ENCOUNTER — Other Ambulatory Visit: Payer: Self-pay

## 2017-03-02 DIAGNOSIS — D649 Anemia, unspecified: Secondary | ICD-10-CM

## 2017-03-02 NOTE — Progress Notes (Signed)
Anemia slightly worse Ferritin NL as was iron sat I think hematology evaluation reasonable and will suggest  Dx is normocytic anemia   We will call her with this recommendation - she can check with PCP also if desired

## 2017-03-13 ENCOUNTER — Ambulatory Visit
Admission: RE | Admit: 2017-03-13 | Discharge: 2017-03-13 | Disposition: A | Discharge status: Home / Self Care | Payer: Medicare Other | Source: Ambulatory Visit | Attending: Obstetrics & Gynecology | Admitting: Obstetrics & Gynecology

## 2017-03-13 ENCOUNTER — Telehealth: Payer: Self-pay | Admitting: Hematology and Oncology

## 2017-03-13 DIAGNOSIS — M064 Inflammatory polyarthropathy: Secondary | ICD-10-CM | POA: Diagnosis not present

## 2017-03-13 DIAGNOSIS — M25561 Pain in right knee: Secondary | ICD-10-CM | POA: Diagnosis not present

## 2017-03-13 DIAGNOSIS — G5702 Lesion of sciatic nerve, left lower limb: Secondary | ICD-10-CM | POA: Diagnosis not present

## 2017-03-13 DIAGNOSIS — Z6823 Body mass index (BMI) 23.0-23.9, adult: Secondary | ICD-10-CM | POA: Diagnosis not present

## 2017-03-13 DIAGNOSIS — Z853 Personal history of malignant neoplasm of breast: Secondary | ICD-10-CM

## 2017-03-13 DIAGNOSIS — R922 Inconclusive mammogram: Secondary | ICD-10-CM | POA: Diagnosis not present

## 2017-03-13 HISTORY — DX: Personal history of irradiation: Z92.3

## 2017-03-13 NOTE — Telephone Encounter (Signed)
Spoke with patient and gave her information regarding appointment ..Date/Time/Location 7 Phone number

## 2017-04-01 NOTE — Telephone Encounter (Signed)
Pt seen on 02-27-17

## 2017-04-03 ENCOUNTER — Telehealth: Payer: Self-pay | Admitting: Hematology and Oncology

## 2017-04-03 ENCOUNTER — Ambulatory Visit (HOSPITAL_BASED_OUTPATIENT_CLINIC_OR_DEPARTMENT_OTHER): Payer: Medicare Other | Admitting: Hematology and Oncology

## 2017-04-03 ENCOUNTER — Encounter: Payer: Self-pay | Admitting: Hematology and Oncology

## 2017-04-03 ENCOUNTER — Ambulatory Visit (HOSPITAL_BASED_OUTPATIENT_CLINIC_OR_DEPARTMENT_OTHER): Payer: Medicare Other

## 2017-04-03 VITALS — BP 140/50 | HR 62 | Temp 98.6°F | Resp 17 | Wt 141.4 lb

## 2017-04-03 DIAGNOSIS — D649 Anemia, unspecified: Secondary | ICD-10-CM | POA: Diagnosis not present

## 2017-04-03 DIAGNOSIS — R200 Anesthesia of skin: Secondary | ICD-10-CM

## 2017-04-03 DIAGNOSIS — R5383 Other fatigue: Secondary | ICD-10-CM | POA: Diagnosis not present

## 2017-04-03 DIAGNOSIS — R0602 Shortness of breath: Secondary | ICD-10-CM

## 2017-04-03 LAB — TSH: TSH: 0.454 m(IU)/L (ref 0.308–3.960)

## 2017-04-03 LAB — CBC & DIFF AND RETIC
BASO%: 0.2 % (ref 0.0–2.0)
Basophils Absolute: 0 10e3/uL (ref 0.0–0.1)
EOS%: 1.1 % (ref 0.0–7.0)
Eosinophils Absolute: 0.1 10e3/uL (ref 0.0–0.5)
HCT: 31.5 % — ABNORMAL LOW (ref 34.8–46.6)
HGB: 10.7 g/dL — ABNORMAL LOW (ref 11.6–15.9)
Immature Retic Fract: 1.6 % (ref 1.60–10.00)
LYMPH%: 18 % (ref 14.0–49.7)
MCH: 30.8 pg (ref 25.1–34.0)
MCHC: 34 g/dL (ref 31.5–36.0)
MCV: 90.8 fL (ref 79.5–101.0)
MONO#: 0.5 10e3/uL (ref 0.1–0.9)
MONO%: 7.3 % (ref 0.0–14.0)
NEUT#: 4.8 10e3/uL (ref 1.5–6.5)
NEUT%: 73.4 % (ref 38.4–76.8)
Platelets: 223 10e3/uL (ref 145–400)
RBC: 3.47 10e6/uL — ABNORMAL LOW (ref 3.70–5.45)
RDW: 12.5 % (ref 11.2–14.5)
Retic %: 1.03 % (ref 0.70–2.10)
Retic Ct Abs: 35.74 10e3/uL (ref 33.70–90.70)
WBC: 6.6 10e3/uL (ref 3.9–10.3)
lymph#: 1.2 10e3/uL (ref 0.9–3.3)

## 2017-04-03 LAB — COMPREHENSIVE METABOLIC PANEL WITH GFR
ALT: 15 U/L (ref 0–55)
AST: 17 U/L (ref 5–34)
Albumin: 4 g/dL (ref 3.5–5.0)
Alkaline Phosphatase: 82 U/L (ref 40–150)
Anion Gap: 7 meq/L (ref 3–11)
BUN: 25.3 mg/dL (ref 7.0–26.0)
CO2: 28 meq/L (ref 22–29)
Calcium: 9.6 mg/dL (ref 8.4–10.4)
Chloride: 101 meq/L (ref 98–109)
Creatinine: 1.1 mg/dL (ref 0.6–1.1)
EGFR: 46 ml/min/1.73 m2 — ABNORMAL LOW
Glucose: 86 mg/dL (ref 70–140)
Potassium: 4.8 meq/L (ref 3.5–5.1)
Sodium: 135 meq/L — ABNORMAL LOW (ref 136–145)
Total Bilirubin: 0.49 mg/dL (ref 0.20–1.20)
Total Protein: 7.2 g/dL (ref 6.4–8.3)

## 2017-04-03 LAB — MORPHOLOGY: PLT EST: ADEQUATE

## 2017-04-03 LAB — IRON AND TIBC
%SAT: 38 % (ref 21–57)
IRON: 108 ug/dL (ref 41–142)
TIBC: 284 ug/dL (ref 236–444)
UIBC: 176 ug/dL (ref 120–384)

## 2017-04-03 LAB — FERRITIN: FERRITIN: 54 ng/mL (ref 9–269)

## 2017-04-03 LAB — LACTATE DEHYDROGENASE: LDH: 171 U/L (ref 125–245)

## 2017-04-03 NOTE — Telephone Encounter (Signed)
Gave avs and calendar for January  °

## 2017-04-04 LAB — FOLATE

## 2017-04-04 LAB — HAPTOGLOBIN: Haptoglobin: 121 mg/dL (ref 34–200)

## 2017-04-04 LAB — VITAMIN B12: Vitamin B12: 263 pg/mL (ref 232–1245)

## 2017-04-06 LAB — DIRECT ANTIGLOBULIN TEST (NOT AT ARMC): COOMBS', DIRECT: NEGATIVE

## 2017-04-07 ENCOUNTER — Other Ambulatory Visit (INDEPENDENT_AMBULATORY_CARE_PROVIDER_SITE_OTHER): Payer: Medicare Other

## 2017-04-07 ENCOUNTER — Other Ambulatory Visit: Payer: Self-pay

## 2017-04-07 DIAGNOSIS — L821 Other seborrheic keratosis: Secondary | ICD-10-CM | POA: Diagnosis not present

## 2017-04-07 DIAGNOSIS — D2272 Melanocytic nevi of left lower limb, including hip: Secondary | ICD-10-CM | POA: Diagnosis not present

## 2017-04-07 DIAGNOSIS — I10 Essential (primary) hypertension: Secondary | ICD-10-CM

## 2017-04-07 DIAGNOSIS — R944 Abnormal results of kidney function studies: Secondary | ICD-10-CM

## 2017-04-07 DIAGNOSIS — D225 Melanocytic nevi of trunk: Secondary | ICD-10-CM | POA: Diagnosis not present

## 2017-04-07 DIAGNOSIS — B351 Tinea unguium: Secondary | ICD-10-CM | POA: Diagnosis not present

## 2017-04-07 DIAGNOSIS — L814 Other melanin hyperpigmentation: Secondary | ICD-10-CM | POA: Diagnosis not present

## 2017-04-07 DIAGNOSIS — D1801 Hemangioma of skin and subcutaneous tissue: Secondary | ICD-10-CM | POA: Diagnosis not present

## 2017-04-07 DIAGNOSIS — D220 Melanocytic nevi of lip: Secondary | ICD-10-CM | POA: Diagnosis not present

## 2017-04-07 DIAGNOSIS — D2239 Melanocytic nevi of other parts of face: Secondary | ICD-10-CM | POA: Diagnosis not present

## 2017-04-07 DIAGNOSIS — L578 Other skin changes due to chronic exposure to nonionizing radiation: Secondary | ICD-10-CM | POA: Diagnosis not present

## 2017-04-07 DIAGNOSIS — N289 Disorder of kidney and ureter, unspecified: Secondary | ICD-10-CM

## 2017-04-07 DIAGNOSIS — D2262 Melanocytic nevi of left upper limb, including shoulder: Secondary | ICD-10-CM | POA: Diagnosis not present

## 2017-04-07 NOTE — Progress Notes (Signed)
Order for patient to have CMP 3 month recheck placed.

## 2017-04-08 LAB — COMPREHENSIVE METABOLIC PANEL
A/G RATIO: 1.6 (ref 1.2–2.2)
ALT: 14 IU/L (ref 0–32)
AST: 18 IU/L (ref 0–40)
Albumin: 4.4 g/dL (ref 3.5–4.8)
Alkaline Phosphatase: 82 IU/L (ref 39–117)
BILIRUBIN TOTAL: 0.6 mg/dL (ref 0.0–1.2)
BUN/Creatinine Ratio: 17 (ref 12–28)
BUN: 19 mg/dL (ref 8–27)
CHLORIDE: 102 mmol/L (ref 96–106)
CO2: 25 mmol/L (ref 20–29)
Calcium: 10.1 mg/dL (ref 8.7–10.3)
Creatinine, Ser: 1.14 mg/dL — ABNORMAL HIGH (ref 0.57–1.00)
GFR calc Af Amer: 53 mL/min/{1.73_m2} — ABNORMAL LOW (ref >59)
GFR calc non Af Amer: 46 mL/min/{1.73_m2} — ABNORMAL LOW (ref >59)
Globulin, Total: 2.7 g/dL (ref 1.5–4.5)
Glucose: 94 mg/dL (ref 65–99)
Potassium: 5.1 mmol/L (ref 3.5–5.2)
Sodium: 142 mmol/L (ref 134–144)
Total Protein: 7.1 g/dL (ref 6.0–8.5)

## 2017-04-15 ENCOUNTER — Telehealth: Payer: Self-pay | Admitting: *Deleted

## 2017-04-15 NOTE — Telephone Encounter (Signed)
Spoke with patient. Advised as seen below per Dr. Sabra Heck. Patient states she is seeing wellness nurse in May, Dr. Lebron Conners on 04/20/17. Patient states she will call to schedule earlier OV with PCP, request to wait to schedule until after visit with Dr. Lebron Conners. Advised patient will update Dr. Sabra Heck, return call with any additional questions/concerns or assistance with scheduling. Patient verbalizes understanding and is agreeable.   Routing to provider for final review. Patient is agreeable to disposition. Will close encounter.

## 2017-04-15 NOTE — Progress Notes (Signed)
[facility] New Visit:  Assessment: Anemia 77 y.o. female with mild, gradually progressive normocytic, normochromic anemia with trend towards macrocytosis over the past 2 years.  Some associated symptoms of hemorrhoidal bleeding, but also progressive fatigue, and occasional paresthesias in the right upper extremity.  Differential is presently broad, will start with basic lab work to start narrowing down the diagnosis.  Plan: -- Obtain lab work today as a as outlined below. --Return to clinic in 2 weeks to review the findings and continue evaluation as needed.  Voice recognition software was used and creation of this note. Despite my best effort at editing the text, some misspelling/errors may have occurred.  Orders Placed This Encounter  Procedures  . CBC & Diff and Retic    Standing Status:   Future    Number of Occurrences:   1    Standing Expiration Date:   04/03/2018  . Morphology    Standing Status:   Future    Number of Occurrences:   1    Standing Expiration Date:   04/03/2018  . Comprehensive metabolic panel    Standing Status:   Future    Number of Occurrences:   1    Standing Expiration Date:   04/03/2018  . Lactate dehydrogenase (LDH)    Standing Status:   Future    Number of Occurrences:   1    Standing Expiration Date:   04/03/2018  . Ferritin    Standing Status:   Future    Number of Occurrences:   1    Standing Expiration Date:   04/03/2018  . Iron and TIBC    Standing Status:   Future    Number of Occurrences:   1    Standing Expiration Date:   04/03/2018  . Vitamin B12    Standing Status:   Future    Number of Occurrences:   1    Standing Expiration Date:   04/03/2018  . Folate, Serum    Standing Status:   Future    Number of Occurrences:   1    Standing Expiration Date:   04/03/2018  . Haptoglobin    Standing Status:   Future    Number of Occurrences:   1    Standing Expiration Date:   04/03/2018  . TSH    Standing Status:   Future    Number of  Occurrences:   1    Standing Expiration Date:   04/03/2018  . Direct antiglobulin test ([place])    Standing Status:   Future    Number of Occurrences:   1    Standing Expiration Date:   04/03/2018    All questions were answered.  . The patient knows to call the clinic with any problems, questions or concerns.  This note was electronically signed.    History of Presenting Illness Caroline Olson 77 y.o. presenting to the [facility] for evaluation of anemia, referred by Dr Gatha Mayer.  Please see hematological history below for details.  Patient's past medical history is significant for DCIS of the breast diagnosed in 2011 treated with surgery, radiation therapy, no adjuvant aromatase inhibitor.  Patient also has history of adenomatous colon polyps detected on colonoscopy in 2014 with the next colonoscopy scheduled next year.  Other than that, patient is remarkably healthy.   Patient denies epistaxis, hemoptysis, hematemesis, melena.  Patient has no hematuria or vaginal bleeding.  No easy bruisability or swollen lymph nodes.  Patient  does have history of bleeding hemorrhoids which are currently not bothering her too much. the initial onset of bleeding was in July 2018, most recent episode preceded the lab work obtained on 02/27/14 by about 1-2 weeks. In the past, patient was receiving iron supplementation she stopped taking oral iron in October 2018.  At the present time, patient denies any fevers, chills, night sweats. Patient reports a gradual decline in overall energy level associated with mild dyspnea over the past several months accompanying activity.  Patient also has had a bout of numbness in the right hand about 2-3 weeks ago which resolved spontaneously.  Oncological/hematological History: --Labs, 02/14/15: WBC 5.3, Hgb 12.1, MCV 86.8, MCH     ..., RDW 13.6, Plt 247 --Labs, 01/01/17: WBC 6.1, Hgb 11.0, MCV 89.0, MCH 30.6, RDW 12.4, Plt 241 --Labs, 02/27/17: WBC 5.9, Hgb 10.7, MCV  91.8, MCH     ..., RDW 13.2, Plt 250; Ferritin 45.2  Medical History: Past Medical History:  Diagnosis Date  . Adenomatous colon polyp 2014  . Breast cancer ([place]) 2011   DCIF  . Osteopenia   . Personal history of radiation therapy   . Varicose veins     Surgical History: Past Surgical History:  Procedure Laterality Date  . ABDOMINAL HYSTERECTOMY  1985  . APPENDECTOMY  1956   pt states appendix was on left side not rt. per surgeon  . BREAST LUMPECTOMY  2011   left, reexcision 1/12  . COLONOSCOPY W/ POLYPECTOMY  2014  . ENDOVENOUS ABLATION SAPHENOUS VEIN W/ LASER Left 01-27-2013   left greater saphenous vein and sclerotherapy left leg by Curt Jews MD  . ENDOVENOUS ABLATION SAPHENOUS VEIN W/ LASER Right 02-10-2013   right greater saphenous vein and sclerotherapy right leg by Curt Jews MD  . ROTATOR CUFF REPAIR Right 2009  . SHOULDER SURGERY  2003   tear  . stab phlebectomy Left 07-21-2013   10-20 incisions left leg by Curt Jews MD  . TRICEPS TENDON REPAIR  2009    Family History: Family History  Problem Relation Age of Onset  . Cancer Mother   . Hypertension Mother   . Other Mother        varicose veins  . Heart disease Father   . Diabetes Father   . Colon cancer Neg Hx     Social History: Social History   Socioeconomic History  . Marital status: Married    Spouse name: Not on file  . Number of children: Not on file  . Years of education: Not on file  . Highest education level: Not on file  Social Needs  . Financial resource strain: Not on file  . Food insecurity - worry: Not on file  . Food insecurity - inability: Not on file  . Transportation needs - medical: Not on file  . Transportation needs - non-medical: Not on file  Occupational History  . Not on file  Tobacco Use  . Smoking status: Never Smoker  . Smokeless tobacco: Never Used  Substance and Sexual Activity  . Alcohol use: Yes    Alcohol/week: 3.0 oz    Types: 5 Glasses of wine per week   . Drug use: No  . Sexual activity: Yes    Partners: Male    Birth control/protection: Post-menopausal    Comment: TAH/BSO  Other Topics Concern  . Not on file  Social History Narrative   Patient is married she is retired and has 2 daughters   Some alcohol use no tobacco  or drug use    Allergies: Allergies  Allergen Reactions  . Aspirin Other (See Comments)    REACTION: ringing in ears  . Codeine Sulfate Nausea And Vomiting    Medications:  Current Outpatient Medications  Medication Sig Dispense Refill  . cholecalciferol (VITAMIN D) 1000 UNITS tablet Take 2,000 Units by mouth daily.     . irbesartan-hydrochlorothiazide (AVALIDE) 300-12.5 MG tablet Take 1 tablet by mouth daily. 90 tablet 3  . metronidazole (NORITATE) 1 % cream Apply topically 2 (two) times daily.     . Multiple Vitamins-Minerals (MULTIVITAMIN WITH MINERALS) tablet Take 1 tablet by mouth 3 (three) times a week.     . Multiple Vitamins-Minerals (PRESERVISION AREDS) CAPS Take by mouth daily.    . Omega-3 Fatty Acids (FISH OIL CONCENTRATE PO) Take by mouth. 1 tsp daily     No current facility-administered medications for this visit.     Review of Systems: Review of Systems  Constitutional: Positive for fatigue.  Respiratory: Positive for shortness of breath.   Neurological: Positive for numbness.     PHYSICAL EXAMINATION Blood pressure (!) 140/50, pulse 62, temperature 98.6 F (37 C), temperature source Oral, resp. rate 17, weight 141 lb 6.4 oz (64.1 kg), last menstrual period 04/01/1983, SpO2 100 %.  ECOG PERFORMANCE STATUS: 1 - Symptomatic but completely ambulatory  Physical Exam  Constitutional: She is oriented to person, place, and time and well-developed, well-nourished, and in no distress. No distress.  HENT:  Head: Normocephalic and atraumatic.  Mouth/Throat: Oropharynx is clear and moist. No oropharyngeal exudate.  Eyes: Conjunctivae and EOM are normal. Pupils are equal, round, and reactive to  light. No scleral icterus.  Neck: No thyromegaly present.  Cardiovascular: Normal rate, regular rhythm and normal heart sounds.  No murmur heard. Pulmonary/Chest: Effort normal and breath sounds normal. No respiratory distress. She has no wheezes. She has no rales.  Abdominal: Soft. Bowel sounds are normal. She exhibits no distension. There is no tenderness. There is no rebound.  Musculoskeletal: She exhibits no edema.  Lymphadenopathy:    She has no cervical adenopathy.  Neurological: She is alert and oriented to person, place, and time. She has normal reflexes. No cranial nerve deficit.  Skin: Skin is warm and dry. No rash noted. She is not diaphoretic. No erythema. No pallor.     LABORATORY DATA: I have personally reviewed the data as listed: Appointment on 04/03/2017  Component Date Value Ref Range Status  . WBC 04/03/2017 6.6  3.9 - 10.3 10e3/uL Final  . NEUT# 04/03/2017 4.8  1.5 - 6.5 10e3/uL Final  . HGB 04/03/2017 10.7* 11.6 - 15.9 g/dL Final  . HCT 04/03/2017 31.5* 34.8 - 46.6 % Final  . Platelets 04/03/2017 223  145 - 400 10e3/uL Final  . MCV 04/03/2017 90.8  79.5 - 101.0 fL Final  . MCH 04/03/2017 30.8  25.1 - 34.0 pg Final  . MCHC 04/03/2017 34.0  31.5 - 36.0 g/dL Final  . RBC 04/03/2017 3.47* 3.70 - 5.45 10e6/uL Final  . RDW 04/03/2017 12.5  11.2 - 14.5 % Final  . lymph# 04/03/2017 1.2  0.9 - 3.3 10e3/uL Final  . MONO# 04/03/2017 0.5  0.1 - 0.9 10e3/uL Final  . Eosinophils Absolute 04/03/2017 0.1  0.0 - 0.5 10e3/uL Final  . Basophils Absolute 04/03/2017 0.0  0.0 - 0.1 10e3/uL Final  . NEUT% 04/03/2017 73.4  38.4 - 76.8 % Final  . LYMPH% 04/03/2017 18.0  14.0 - 49.7 % Final  . MONO% 04/03/2017 7.3  0.0 - 14.0 % Final  . EOS% 04/03/2017 1.1  0.0 - 7.0 % Final  . BASO% 04/03/2017 0.2  0.0 - 2.0 % Final  . Retic % 04/03/2017 1.03  0.70 - 2.10 % Final  . Retic Ct Abs 04/03/2017 35.74  33.70 - 90.70 10e3/uL Final  . Immature Retic Fract 04/03/2017 1.60  1.60 - 10.00 %  Final  . Sodium 04/03/2017 135* 136 - 145 mEq/L Final  . Potassium 04/03/2017 4.8  3.5 - 5.1 mEq/L Final  . Chloride 04/03/2017 101  98 - 109 mEq/L Final  . CO2 04/03/2017 28  22 - 29 mEq/L Final  . Glucose 04/03/2017 86  70 - 140 mg/dl Final   Glucose reference range is for nonfasting patients. Fasting glucose reference range is 70- 100.  Marland Kitchen BUN 04/03/2017 25.3  7.0 - 26.0 mg/dL Final  . Creatinine 04/03/2017 1.1  0.6 - 1.1 mg/dL Final  . Total Bilirubin 04/03/2017 0.49  0.20 - 1.20 mg/dL Final  . Alkaline Phosphatase 04/03/2017 82  40 - 150 U/L Final  . AST 04/03/2017 17  5 - 34 U/L Final  . ALT 04/03/2017 15  0 - 55 U/L Final  . Total Protein 04/03/2017 7.2  6.4 - 8.3 g/dL Final  . Albumin 04/03/2017 4.0  3.5 - 5.0 g/dL Final  . Calcium 04/03/2017 9.6  8.4 - 10.4 mg/dL Final  . Anion Gap 04/03/2017 7  3 - 11 mEq/L Final  . EGFR 04/03/2017 46* >60 ml/min/1.73 m2 Final   eGFR is calculated using the CKD-EPI Creatinine Equation (2009)  . Polychromasia 04/03/2017 Slight  Slight Final  . Ovalocytes 04/03/2017 Few  Negative Final  . White Cell Comments 04/03/2017 C/W auto diff   Final  . PLT EST 04/03/2017 Adequate  Adequate Final  . LDH 04/03/2017 171  125 - 245 U/L Final  . Ferritin 04/03/2017 54  9 - 269 ng/ml Final  . Iron 04/03/2017 108  41 - 142 ug/dL Final  . TIBC 04/03/2017 284  236 - 444 ug/dL Final  . UIBC 04/03/2017 176  120 - 384 ug/dL Final  . %SAT 04/03/2017 38  21 - 57 % Final  . Vitamin B12 04/03/2017 263  232 - 1,245 pg/mL Final  . Folate 04/03/2017 >20.0  >3.0 ng/mL Final   Comment: A serum folate concentration of less than 3.1 ng/mL is considered to represent clinical deficiency.   . Haptoglobin 04/03/2017 121  34 - 200 mg/dL Final  . [place]', Direct 04/03/2017 Negative  Negative Final  . TSH 04/03/2017 0.454  0.308 - 3.960 m(IU)/L Final         Ardath Sax, MD

## 2017-04-15 NOTE — Assessment & Plan Note (Signed)
77 y.o. female with mild, gradually progressive normocytic, normochromic anemia with trend towards macrocytosis over the past 2 years.  Some associated symptoms of hemorrhoidal bleeding, but also progressive fatigue, and occasional paresthesias in the right upper extremity.  Differential is presently broad, will start with basic lab work to start narrowing down the diagnosis.  Plan: -- Obtain lab work today as a as outlined below. --Return to clinic in 2 weeks to review the findings and continue evaluation as needed.

## 2017-04-15 NOTE — Telephone Encounter (Signed)
-----   Message from Megan Salon, MD sent at 04/12/2017  9:59 PM EST ----- Please let pt know her kidney function is about the same and is mildly decreased.  I think she has an appt in May with PCPs office but I can't tell who she will be seeing then.  Does she know.  I think she needs to be seen sooner just to review and make sure no medication changes should be made.  Or I could send her to a nephrologist but, honestly, I think this can initially be managed with primary care.  Thanks.

## 2017-04-15 NOTE — Telephone Encounter (Signed)
Notes recorded by Burnice Logan, RN on 04/15/2017 at 10:29 AM EST Left message to call Sharee Pimple at [phone]. See telephone encounter dated 04/15/17.

## 2017-04-20 ENCOUNTER — Encounter (INDEPENDENT_AMBULATORY_CARE_PROVIDER_SITE_OTHER): Payer: Self-pay

## 2017-04-20 ENCOUNTER — Telehealth: Payer: Self-pay

## 2017-04-20 ENCOUNTER — Encounter: Payer: Self-pay | Admitting: Hematology and Oncology

## 2017-04-20 ENCOUNTER — Telehealth: Payer: Self-pay | Admitting: Hematology and Oncology

## 2017-04-20 ENCOUNTER — Inpatient Hospital Stay: Payer: Medicare Other | Attending: Hematology and Oncology | Admitting: Hematology and Oncology

## 2017-04-20 VITALS — BP 135/41 | HR 61 | Temp 98.2°F | Resp 18 | Ht 65.0 in | Wt 135.5 lb

## 2017-04-20 DIAGNOSIS — E538 Deficiency of other specified B group vitamins: Secondary | ICD-10-CM

## 2017-04-20 DIAGNOSIS — D649 Anemia, unspecified: Secondary | ICD-10-CM | POA: Diagnosis not present

## 2017-04-20 DIAGNOSIS — D519 Vitamin B12 deficiency anemia, unspecified: Secondary | ICD-10-CM

## 2017-04-20 MED ORDER — CYANOCOBALAMIN 1000 MCG PO TABS
1000.0000 ug | ORAL_TABLET | Freq: Every day | ORAL | 3 refills | Status: AC
Start: 2017-04-20 — End: 2017-05-20

## 2017-04-20 NOTE — Telephone Encounter (Signed)
Scheduled appts per 1/21 los - patient did not want avs or calendar.

## 2017-04-20 NOTE — Telephone Encounter (Signed)
Pt sent MyChart message questioning the amount of B12 Dr. Lebron Conners recommended for her to take. Sent response to pt via MyChart that Dr. Lebron Conners recommends the pt take 1,000 mcg B12 PO daily.

## 2017-04-23 ENCOUNTER — Ambulatory Visit (INDEPENDENT_AMBULATORY_CARE_PROVIDER_SITE_OTHER): Payer: Medicare Other | Admitting: Internal Medicine

## 2017-04-23 ENCOUNTER — Other Ambulatory Visit (INDEPENDENT_AMBULATORY_CARE_PROVIDER_SITE_OTHER): Payer: Medicare Other

## 2017-04-23 ENCOUNTER — Encounter: Payer: Self-pay | Admitting: Internal Medicine

## 2017-04-23 VITALS — BP 144/72 | HR 62 | Temp 98.8°F | Ht 65.0 in | Wt 135.0 lb

## 2017-04-23 DIAGNOSIS — N390 Urinary tract infection, site not specified: Secondary | ICD-10-CM | POA: Insufficient documentation

## 2017-04-23 DIAGNOSIS — R3129 Other microscopic hematuria: Secondary | ICD-10-CM

## 2017-04-23 DIAGNOSIS — R319 Hematuria, unspecified: Secondary | ICD-10-CM | POA: Diagnosis not present

## 2017-04-23 DIAGNOSIS — R7989 Other specified abnormal findings of blood chemistry: Secondary | ICD-10-CM | POA: Diagnosis not present

## 2017-04-23 DIAGNOSIS — I10 Essential (primary) hypertension: Secondary | ICD-10-CM | POA: Diagnosis not present

## 2017-04-23 DIAGNOSIS — N183 Chronic kidney disease, stage 3 unspecified: Secondary | ICD-10-CM | POA: Insufficient documentation

## 2017-04-23 DIAGNOSIS — E785 Hyperlipidemia, unspecified: Secondary | ICD-10-CM | POA: Diagnosis not present

## 2017-04-23 LAB — URINALYSIS, ROUTINE W REFLEX MICROSCOPIC
BILIRUBIN URINE: NEGATIVE
KETONES UR: NEGATIVE
NITRITE: NEGATIVE
Total Protein, Urine: NEGATIVE
URINE GLUCOSE: NEGATIVE
Urobilinogen, UA: 0.2 (ref 0.0–1.0)
pH: 7 (ref 5.0–8.0)

## 2017-04-23 MED ORDER — CIPROFLOXACIN HCL 250 MG PO TABS: 250.0000 mg | ORAL_TABLET | Freq: Two times a day (BID) | ORAL | 0 refills | Status: DC

## 2017-04-23 NOTE — Progress Notes (Signed)
Subjective:  Patient ID: Caroline Olson, female    DOB: 10-28-1940  Age: 77 y.o. MRN: [id number]  CC: No chief complaint on file.   HPI Caroline Olson presents for urinary urgency x 2-3 d, bloody this am C/o elev creat 2 weeks ago F/u HTN  Outpatient Medications Prior to Visit  Medication Sig Dispense Refill  . cholecalciferol (VITAMIN D) 1000 UNITS tablet Take 2,000 Units by mouth daily.     . cyanocobalamin 1000 MCG tablet Take 1 tablet (1,000 mcg total) by mouth daily. 60 tablet 3  . irbesartan-hydrochlorothiazide (AVALIDE) 300-12.5 MG tablet Take 1 tablet by mouth daily. 90 tablet 3  . metronidazole (NORITATE) 1 % cream Apply topically 2 (two) times daily.     . Multiple Vitamins-Minerals (MULTIVITAMIN WITH MINERALS) tablet Take 1 tablet by mouth 3 (three) times a week.     . Multiple Vitamins-Minerals (PRESERVISION AREDS) CAPS Take by mouth daily.    . Omega-3 Fatty Acids (FISH OIL CONCENTRATE PO) Take by mouth. 1 tsp daily     No facility-administered medications prior to visit.     ROS Review of Systems  Constitutional: Negative for activity change, appetite change, chills, fatigue and unexpected weight change.  HENT: Negative for congestion, mouth sores and sinus pressure.   Eyes: Negative for visual disturbance.  Respiratory: Negative for cough and chest tightness.   Gastrointestinal: Negative for abdominal pain and nausea.  Genitourinary: Positive for frequency and hematuria. Negative for difficulty urinating and vaginal pain.  Musculoskeletal: Negative for back pain and gait problem.  Skin: Negative for pallor and rash.  Neurological: Negative for dizziness, tremors, weakness, numbness and headaches.  Psychiatric/Behavioral: Negative for confusion and sleep disturbance.    Objective:  BP (!) 144/72 (BP Location: Right Arm, Patient Position: Sitting, Cuff Size: Normal)   Pulse 62   Temp 98.8 F (37.1 C) (Oral)   Ht 5\' 5"  (1.651 m)   Wt 135 lb (61.2 kg)   LMP  04/01/1983   SpO2 100%   BMI 22.47 kg/m   BP Readings from Last 3 Encounters:  04/23/17 (!) 144/72  04/20/17 (!) 135/41  04/03/17 (!) 140/50    Wt Readings from Last 3 Encounters:  04/23/17 135 lb (61.2 kg)  04/20/17 135 lb 8 oz (61.5 kg)  04/03/17 141 lb 6.4 oz (64.1 kg)    Physical Exam  Constitutional: She appears well-developed. No distress.  HENT:  Head: Normocephalic.  Right Ear: External ear normal.  Left Ear: External ear normal.  Nose: Nose normal.  Mouth/Throat: Oropharynx is clear and moist.  Eyes: Conjunctivae are normal. Pupils are equal, round, and reactive to light. Right eye exhibits no discharge. Left eye exhibits no discharge.  Neck: Normal range of motion. Neck supple. No JVD present. No tracheal deviation present. No thyromegaly present.  Cardiovascular: Normal rate, regular rhythm and normal heart sounds.  Pulmonary/Chest: No stridor. No respiratory distress. She has no wheezes.  Abdominal: Soft. Bowel sounds are normal. She exhibits no distension and no mass. There is no tenderness. There is no rebound and no guarding.  Musculoskeletal: She exhibits no edema or tenderness.  Lymphadenopathy:    She has no cervical adenopathy.  Neurological: She displays normal reflexes. No cranial nerve deficit. She exhibits normal muscle tone. Coordination normal.  Skin: No rash noted. No erythema.  Psychiatric: She has a normal mood and affect. Her behavior is normal. Judgment and thought content normal.  R costo-vert angle is tender w/tapping on it  Lab Results  Component Value Date   WBC 6.6 04/03/2017   HGB 10.7 (L) 04/03/2017   HCT 31.5 (L) 04/03/2017   PLT 223 04/03/2017   GLUCOSE 94 04/07/2017   CHOL 212 (H) 01/01/2017   TRIG 77 01/01/2017   HDL 80 01/01/2017   LDLDIRECT 104.3 06/07/2008   LDLCALC 117 (H) 01/01/2017   ALT 14 04/07/2017   AST 18 04/07/2017   NA 142 04/07/2017   K 5.1 04/07/2017   CL 102 04/07/2017   CREATININE 1.14 (H) 04/07/2017    BUN 19 04/07/2017   CO2 25 04/07/2017   TSH 0.454 04/03/2017    Mm Diag Breast Tomo Bilateral  Result Date: 03/13/2017 CLINICAL DATA:  History of a left lumpectomy for breast carcinoma in 2011.No current breast complaints. EXAM: 2D DIGITAL DIAGNOSTIC BILATERAL MAMMOGRAM WITH CAD AND ADJUNCT TOMO COMPARISON:  Previous exam(s). ACR Breast Density Category c: The breast tissue is heterogeneously dense, which may obscure small masses. FINDINGS: Architectural distortion in the upper left breast is stable prior exams reflecting post surgical scarring. There are no discrete masses or new areas of architectural distortion. There are no suspicious calcifications. Mammographic images were processed with CAD. IMPRESSION: 1. No evidence of new or recurrent breast carcinoma. 2. Benign postsurgical changes on the left. RECOMMENDATION: Screening mammogram in one year.(Code:SM-B-01Y) I have discussed the findings and recommendations with the patient. Results were also provided in writing at the conclusion of the visit. If applicable, a reminder letter will be sent to the patient regarding the next appointment. BI-RADS CATEGORY  2: Benign. Electronically Signed   By: Lajean Manes M.D.   On: 03/13/2017 13:28    Assessment & Plan:   There are no diagnoses linked to this encounter. I am having Caroline Crochet. Olson "Caroline Olson" maintain her metronidazole, multivitamin with minerals, cholecalciferol, Omega-3 Fatty Acids (FISH OIL CONCENTRATE PO), PRESERVISION AREDS, irbesartan-hydrochlorothiazide, and cyanocobalamin.  No orders of the defined types were placed in this encounter.    Follow-up: No Follow-up on file.  Walker Kehr, MD

## 2017-04-23 NOTE — Assessment & Plan Note (Signed)
Labs Renal US 

## 2017-04-23 NOTE — Assessment & Plan Note (Signed)
On Avalide

## 2017-04-23 NOTE — Assessment & Plan Note (Signed)
Labs/UA Renal US PO abx

## 2017-04-23 NOTE — Assessment & Plan Note (Signed)
Labs/UA Renal US Urol ref if needed

## 2017-05-08 NOTE — Progress Notes (Signed)
[name]:  Assessment: Anemia 77 y.o. female with mild, gradually progressive normocytic, normochromic anemia with trend towards macrocytosis over the past 2 years.  Some associated symptoms of hemorrhoidal bleeding, but also progressive fatigue, and occasional paresthesias in the right upper extremity.    Laboratory evaluation revealed mild B12 deficiency which is likely responsible for the patient's anemia.  Plan: -Start oral B12 supplementation 1000 mcg daily -Return to clinic in 3 months with labs as outlined below, and clinic visit to review hematological state.  Voice recognition software was used and creation of this note. Despite my best effort at editing the text, some misspelling/errors may have occurred.  Orders Placed This Encounter  Procedures  . CBC with Differential (Cancer Center Only)    Standing Status:   Future    Standing Expiration Date:   04/20/2018  . CMP ([facility] only)    Standing Status:   Future    Standing Expiration Date:   04/20/2018  . Vitamin B12    Standing Status:   Future    Standing Expiration Date:   04/20/2018  . Methylmalonic acid, serum    Standing Status:   Future    Standing Expiration Date:   04/20/2018  . Ferritin    Standing Status:   Future    Standing Expiration Date:   04/20/2018  . Iron and TIBC    Standing Status:   Future    Standing Expiration Date:   04/20/2018    Cancer Staging No matching staging information was found for the patient.  All questions were answered.   The patient knows to call the clinic with any problems, questions or concerns.  This note was electronically signed.    History of Presenting Illness Caroline Olson is a 77 y.o. followed in the [facility] for diagnosis of anemia, referred by Dr Gatha Mayer.  Please see hematological history below for details.  Patient's past medical history is significant for DCIS of the breast diagnosed in 2011 treated with  surgery, radiation therapy, no adjuvant aromatase inhibitor.  Patient also has history of adenomatous colon polyps detected on colonoscopy in 2014 with the next colonoscopy scheduled next year.  Other than that, patient is remarkably healthy.   Patient denies epistaxis, hemoptysis, hematemesis, melena.  Patient has no hematuria or vaginal bleeding.  No easy bruisability or swollen lymph nodes.  Patient does have history of bleeding hemorrhoids which are currently not bothering her too much. the initial onset of bleeding was in July 2018, most recent episode preceded the lab work obtained on 02/27/14 by about 1-2 weeks. In the past, patient was receiving iron supplementation she stopped taking oral iron in October 2018.  At the present time, patient denies any fevers, chills, night sweats. Patient reports a gradual decline in overall energy level associated with mild dyspnea over the past several months accompanying activity.  Patient also has had a bout of numbness in the right hand about 2-3 weeks ago which resolved spontaneously.  Oncological/hematological History: --Labs, 02/14/15: WBC 5.3, Hgb 12.1, MCV 86.8, MCH     ..., RDW 13.6, Plt 247 --Labs, 01/01/17: WBC 6.1, Hgb 11.0, MCV 89.0, MCH 30.6, RDW 12.4, Plt 241 --Labs, 02/27/17: WBC 5.9, Hgb 10.7, MCV 91.8, MCH     ..., RDW 13.2, Plt 250;       Ferritin 45 --Labs, 04/03/17: WBC 6.6, Hgb 10.7, MCV 90.8, MCH 30.8, RDW     ..., Plt 233; Fe 108, FeSat  38%, TIBC 284, Ferritin 54, Folate >20, Vit B12 263    Medical History: Past Medical History:  Diagnosis Date  . Adenomatous colon polyp 2014  . Breast cancer ([place]) 2011   DCIF  . Osteopenia   . Personal history of radiation therapy   . Varicose veins     Surgical History: Past Surgical History:  Procedure Laterality Date  . ABDOMINAL HYSTERECTOMY  1985  . APPENDECTOMY  1956   pt states appendix was on left side not rt. per surgeon  . BREAST LUMPECTOMY  2011   left, reexcision 1/12  .  COLONOSCOPY W/ POLYPECTOMY  2014  . ENDOVENOUS ABLATION SAPHENOUS VEIN W/ LASER Left 01-27-2013   left greater saphenous vein and sclerotherapy left leg by Curt Jews MD  . ENDOVENOUS ABLATION SAPHENOUS VEIN W/ LASER Right 02-10-2013   right greater saphenous vein and sclerotherapy right leg by Curt Jews MD  . ROTATOR CUFF REPAIR Right 2009  . SHOULDER SURGERY  2003   tear  . stab phlebectomy Left 07-21-2013   10-20 incisions left leg by Curt Jews MD  . TRICEPS TENDON REPAIR  2009    Family History: Family History  Problem Relation Age of Onset  . Cancer Mother   . Hypertension Mother   . Other Mother        varicose veins  . Heart disease Father   . Diabetes Father   . Colon cancer Neg Hx     Social History: Social History   Socioeconomic History  . Marital status: Married    Spouse name: Not on file  . Number of children: Not on file  . Years of education: Not on file  . Highest education level: Not on file  Social Needs  . Financial resource strain: Not on file  . Food insecurity - worry: Not on file  . Food insecurity - inability: Not on file  . Transportation needs - medical: Not on file  . Transportation needs - non-medical: Not on file  Occupational History  . Not on file  Tobacco Use  . Smoking status: Never Smoker  . Smokeless tobacco: Never Used  Substance and Sexual Activity  . Alcohol use: Yes    Alcohol/week: 3.0 oz    Types: 5 Glasses of wine per week  . Drug use: No  . Sexual activity: Yes    Partners: Male    Birth control/protection: Post-menopausal    Comment: TAH/BSO  Other Topics Concern  . Not on file  Social History Narrative   Patient is married she is retired and has 2 daughters   Some alcohol use no tobacco or drug use    Allergies: Allergies  Allergen Reactions  . Aspirin Other (See Comments)    REACTION: ringing in ears  . Codeine Sulfate Nausea And Vomiting    Medications:  Current Outpatient Medications  Medication  Sig Dispense Refill  . cholecalciferol (VITAMIN D) 1000 UNITS tablet Take 2,000 Units by mouth daily.     . irbesartan-hydrochlorothiazide (AVALIDE) 300-12.5 MG tablet Take 1 tablet by mouth daily. 90 tablet 3  . metronidazole (NORITATE) 1 % cream Apply topically 2 (two) times daily.     . Multiple Vitamins-Minerals (MULTIVITAMIN WITH MINERALS) tablet Take 1 tablet by mouth 3 (three) times a week.     . Multiple Vitamins-Minerals (PRESERVISION AREDS) CAPS Take by mouth daily.    . Omega-3 Fatty Acids (FISH OIL CONCENTRATE PO) Take by mouth. 1 tsp daily    . ciprofloxacin (CIPRO)  250 MG tablet Take 1 tablet (250 mg total) by mouth 2 (two) times daily. 10 tablet 0  . cyanocobalamin 1000 MCG tablet Take 1 tablet (1,000 mcg total) by mouth daily. 60 tablet 3   No current facility-administered medications for this visit.     Review of Systems: Review of Systems  Constitutional: Positive for fatigue.  Respiratory: Positive for shortness of breath.   Neurological: Positive for numbness.     PHYSICAL EXAMINATION Blood pressure (!) 135/41, pulse 61, temperature 98.2 F (36.8 C), temperature source Oral, resp. rate 18, height 5\' 5"  (1.651 m), weight 135 lb 8 oz (61.5 kg), last menstrual period 04/01/1983, SpO2 100 %.  ECOG PERFORMANCE STATUS: 1 - Symptomatic but completely ambulatory  Physical Exam  Constitutional: She is oriented to person, place, and time and well-developed, well-nourished, and in no distress. No distress.  HENT:  Head: Normocephalic and atraumatic.  Mouth/Throat: Oropharynx is clear and moist. No oropharyngeal exudate.  Eyes: Conjunctivae and EOM are normal. Pupils are equal, round, and reactive to light. No scleral icterus.  Neck: No thyromegaly present.  Cardiovascular: Normal rate, regular rhythm and normal heart sounds.  No murmur heard. Pulmonary/Chest: Effort normal and breath sounds normal. No respiratory distress. She has no wheezes. She has no rales.   Abdominal: Soft. Bowel sounds are normal. She exhibits no distension. There is no tenderness. There is no rebound.  Musculoskeletal: She exhibits no edema.  Lymphadenopathy:    She has no cervical adenopathy.  Neurological: She is alert and oriented to person, place, and time. She has normal reflexes. No cranial nerve deficit.  Skin: Skin is warm and dry. No rash noted. She is not diaphoretic. No erythema. No pallor.     LABORATORY DATA: I have personally reviewed the data as listed: No visits with results within 1 Week(s) from this visit.  Latest known visit with results is:  Appointment on 04/07/2017  Component Date Value Ref Range Status  . Glucose 04/07/2017 94  65 - 99 mg/dL Final  . BUN 04/07/2017 19  8 - 27 mg/dL Final  . Creatinine, Ser 04/07/2017 1.14* 0.57 - 1.00 mg/dL Final  . GFR calc non Af Amer 04/07/2017 46* >59 mL/min/1.73 Final  . GFR calc Af Amer 04/07/2017 53* >59 mL/min/1.73 Final  . BUN/Creatinine Ratio 04/07/2017 17  12 - 28 Final  . Sodium 04/07/2017 142  134 - 144 mmol/L Final  . Potassium 04/07/2017 5.1  3.5 - 5.2 mmol/L Final  . Chloride 04/07/2017 102  96 - 106 mmol/L Final  . CO2 04/07/2017 25  20 - 29 mmol/L Final  . Calcium 04/07/2017 10.1  8.7 - 10.3 mg/dL Final  . Total Protein 04/07/2017 7.1  6.0 - 8.5 g/dL Final  . Albumin 04/07/2017 4.4  3.5 - 4.8 g/dL Final  . Globulin, Total 04/07/2017 2.7  1.5 - 4.5 g/dL Final  . Albumin/Globulin Ratio 04/07/2017 1.6  1.2 - 2.2 Final  . Bilirubin Total 04/07/2017 0.6  0.0 - 1.2 mg/dL Final  . Alkaline Phosphatase 04/07/2017 82  39 - 117 IU/L Final  . AST 04/07/2017 18  0 - 40 IU/L Final  . ALT 04/07/2017 14  0 - 32 IU/L Final       Ardath Sax, MD

## 2017-05-10 NOTE — Assessment & Plan Note (Signed)
77 y.o. female with mild, gradually progressive normocytic, normochromic anemia with trend towards macrocytosis over the past 2 years.  Some associated symptoms of hemorrhoidal bleeding, but also progressive fatigue, and occasional paresthesias in the right upper extremity.    Laboratory evaluation revealed mild B12 deficiency which is likely responsible for the patient's anemia.  Plan: -Start oral B12 supplementation 1000 mcg daily -Return to clinic in 3 months with labs as outlined below, and clinic visit to review hematological state.

## 2017-05-18 ENCOUNTER — Ambulatory Visit
Admission: RE | Admit: 2017-05-18 | Discharge: 2017-05-18 | Disposition: A | Discharge status: Home / Self Care | Payer: Medicare Other | Source: Ambulatory Visit | Attending: Internal Medicine | Admitting: Internal Medicine

## 2017-05-18 DIAGNOSIS — R319 Hematuria, unspecified: Secondary | ICD-10-CM | POA: Diagnosis not present

## 2017-05-18 DIAGNOSIS — R3129 Other microscopic hematuria: Secondary | ICD-10-CM

## 2017-05-18 DIAGNOSIS — R7989 Other specified abnormal findings of blood chemistry: Secondary | ICD-10-CM

## 2017-05-18 DIAGNOSIS — N390 Urinary tract infection, site not specified: Secondary | ICD-10-CM

## 2017-06-01 ENCOUNTER — Other Ambulatory Visit (INDEPENDENT_AMBULATORY_CARE_PROVIDER_SITE_OTHER): Payer: Medicare Other

## 2017-06-01 DIAGNOSIS — R3129 Other microscopic hematuria: Secondary | ICD-10-CM

## 2017-06-01 DIAGNOSIS — I10 Essential (primary) hypertension: Secondary | ICD-10-CM

## 2017-06-01 DIAGNOSIS — E785 Hyperlipidemia, unspecified: Secondary | ICD-10-CM | POA: Diagnosis not present

## 2017-06-01 LAB — BASIC METABOLIC PANEL
BUN: 26 mg/dL — AB (ref 6–23)
CHLORIDE: 101 meq/L (ref 96–112)
CO2: 30 meq/L (ref 19–32)
CREATININE: 1.24 mg/dL — AB (ref 0.40–1.20)
Calcium: 10.1 mg/dL (ref 8.4–10.5)
GFR: 44.41 mL/min — ABNORMAL LOW (ref >60.00)
Glucose, Bld: 118 mg/dL — ABNORMAL HIGH (ref 70–99)
Potassium: 4.9 mEq/L (ref 3.5–5.1)
Sodium: 137 mEq/L (ref 135–145)

## 2017-06-01 LAB — CBC WITH DIFFERENTIAL/PLATELET
BASOS ABS: 0 10*3/uL (ref 0.0–0.1)
Basophils Relative: 0.9 % (ref 0.0–3.0)
Eosinophils Absolute: 0.1 10*3/uL (ref 0.0–0.7)
Eosinophils Relative: 1.6 % (ref 0.0–5.0)
HCT: 33.3 % — ABNORMAL LOW (ref 36.0–46.0)
Hemoglobin: 11.6 g/dL — ABNORMAL LOW (ref 12.0–15.0)
Lymphocytes Relative: 23.1 % (ref 12.0–46.0)
Lymphs Abs: 1.2 10*3/uL (ref 0.7–4.0)
MCHC: 34.9 g/dL (ref 30.0–36.0)
MCV: 90 fl (ref 78.0–100.0)
MONOS PCT: 7.8 % (ref 3.0–12.0)
Monocytes Absolute: 0.4 10*3/uL (ref 0.1–1.0)
NEUTROS ABS: 3.5 10*3/uL (ref 1.4–7.7)
Neutrophils Relative %: 66.6 % (ref 43.0–77.0)
PLATELETS: 254 10*3/uL (ref 150.0–400.0)
RBC: 3.7 Mil/uL — ABNORMAL LOW (ref 3.87–5.11)
RDW: 12.7 % (ref 11.5–15.5)
WBC: 5.3 10*3/uL (ref 4.0–10.5)

## 2017-06-01 LAB — LIPID PANEL
CHOL/HDL RATIO: 3
CHOLESTEROL: 209 mg/dL — AB (ref 0–200)
HDL: 83.1 mg/dL (ref >39.00)
LDL Cholesterol: 114 mg/dL — ABNORMAL HIGH (ref 0–99)
NonHDL: 125.62
TRIGLYCERIDES: 57 mg/dL (ref 0.0–149.0)
VLDL: 11.4 mg/dL (ref 0.0–40.0)

## 2017-06-05 ENCOUNTER — Encounter: Payer: Self-pay | Admitting: Internal Medicine

## 2017-06-05 ENCOUNTER — Ambulatory Visit (INDEPENDENT_AMBULATORY_CARE_PROVIDER_SITE_OTHER): Payer: Medicare Other | Admitting: Internal Medicine

## 2017-06-05 DIAGNOSIS — R7989 Other specified abnormal findings of blood chemistry: Secondary | ICD-10-CM | POA: Diagnosis not present

## 2017-06-05 DIAGNOSIS — R7309 Other abnormal glucose: Secondary | ICD-10-CM

## 2017-06-05 MED ORDER — IRBESARTAN 150 MG PO TABS: 150.0000 mg | ORAL_TABLET | Freq: Every day | ORAL | 3 refills | Status: DC

## 2017-06-05 NOTE — Assessment & Plan Note (Signed)
Watching  

## 2017-06-05 NOTE — Progress Notes (Signed)
Subjective:  Patient ID: Caroline Olson, female    DOB: [birth year]  Age: 77 y.o. MRN: [id number]  CC: No chief complaint on file.   HPI SCOTTIE STANISH presents for elevated creatinine and HTN F/u elevated glu   Outpatient Medications Prior to Visit  Medication Sig Dispense Refill  . cholecalciferol (VITAMIN D) 1000 UNITS tablet Take 2,000 Units by mouth daily.     . irbesartan-hydrochlorothiazide (AVALIDE) 300-12.5 MG tablet Take 1 tablet by mouth daily. 90 tablet 3  . metronidazole (NORITATE) 1 % cream Apply topically 2 (two) times daily.     . Multiple Vitamins-Minerals (MULTIVITAMIN WITH MINERALS) tablet Take 1 tablet by mouth 3 (three) times a week.     . Multiple Vitamins-Minerals (PRESERVISION AREDS) CAPS Take by mouth daily.    . Omega-3 Fatty Acids (FISH OIL CONCENTRATE PO) Take by mouth. 1 tsp daily    . ciprofloxacin (CIPRO) 250 MG tablet Take 1 tablet (250 mg total) by mouth 2 (two) times daily. 10 tablet 0   No facility-administered medications prior to visit.     ROS Review of Systems  Constitutional: Negative for activity change, appetite change, chills, fatigue and unexpected weight change.  HENT: Negative for congestion, mouth sores and sinus pressure.   Eyes: Negative for visual disturbance.  Respiratory: Negative for cough and chest tightness.   Gastrointestinal: Negative for abdominal pain and nausea.  Genitourinary: Negative for difficulty urinating, frequency and vaginal pain.  Musculoskeletal: Negative for back pain and gait problem.  Skin: Negative for pallor and rash.  Neurological: Negative for dizziness, tremors, weakness, numbness and headaches.  Psychiatric/Behavioral: Negative for confusion and sleep disturbance.    Objective:  BP 126/68 (BP Location: Left Arm, Patient Position: Sitting, Cuff Size: Normal)   Pulse 64   Temp 98.2 F (36.8 C) (Oral)   Ht 5\' 5"  (1.651 m)   Wt 131 lb (59.4 kg)   LMP 04/01/1983   SpO2 99%   BMI 21.80 kg/m    BP Readings from Last 3 Encounters:  06/05/17 126/68  04/23/17 (!) 144/72  04/20/17 (!) 135/41    Wt Readings from Last 3 Encounters:  06/05/17 131 lb (59.4 kg)  04/23/17 135 lb (61.2 kg)  04/20/17 135 lb 8 oz (61.5 kg)    Physical Exam  Constitutional: She appears well-developed. No distress.  HENT:  Head: Normocephalic.  Right Ear: External ear normal.  Left Ear: External ear normal.  Nose: Nose normal.  Mouth/Throat: Oropharynx is clear and moist.  Eyes: Conjunctivae are normal. Pupils are equal, round, and reactive to light. Right eye exhibits no discharge. Left eye exhibits no discharge.  Neck: Normal range of motion. Neck supple. No JVD present. No tracheal deviation present. No thyromegaly present.  Cardiovascular: Normal rate, regular rhythm and normal heart sounds.  Pulmonary/Chest: No stridor. No respiratory distress. She has no wheezes.  Abdominal: Soft. Bowel sounds are normal. She exhibits no distension and no mass. There is no tenderness. There is no rebound and no guarding.  Musculoskeletal: She exhibits no edema or tenderness.  Lymphadenopathy:    She has no cervical adenopathy.  Neurological: She displays normal reflexes. No cranial nerve deficit. She exhibits normal muscle tone. Coordination normal.  Skin: No rash noted. No erythema.  Psychiatric: She has a normal mood and affect. Her behavior is normal. Judgment and thought content normal.    Lab Results  Component Value Date   WBC 5.3 06/01/2017   HGB 11.6 (L) 06/01/2017   HCT 33.3 (  L) 06/01/2017   PLT 254.0 06/01/2017   GLUCOSE 118 (H) 06/01/2017   CHOL 209 (H) 06/01/2017   TRIG 57.0 06/01/2017   HDL 83.10 06/01/2017   LDLDIRECT 104.3 06/07/2008   LDLCALC 114 (H) 06/01/2017   ALT 14 04/07/2017   AST 18 04/07/2017   NA 137 06/01/2017   K 4.9 06/01/2017   CL 101 06/01/2017   CREATININE 1.24 (H) 06/01/2017   BUN 26 (H) 06/01/2017   CO2 30 06/01/2017   TSH 0.454 04/03/2017    US  Renal  Result Date: 05/18/2017 CLINICAL DATA:  Hematuria with elevated serum creatinine EXAM: RENAL / URINARY TRACT ULTRASOUND COMPLETE COMPARISON:  None. FINDINGS: Right Kidney: Length: 9.6 cm. Echogenicity and renal cortical thickness are within normal limits. No mass, perinephric fluid, or hydronephrosis visualized. No sonographically demonstrable calculus or ureterectasis. Left Kidney: Length: 9.7 cm. Echogenicity and renal cortical thickness are within normal limits. No mass, perinephric fluid, or hydronephrosis visualized. No sonographically demonstrable calculus or ureterectasis. Bladder: Appears normal for degree of bladder distention. IMPRESSION: Study within normal limits. Electronically Signed   By: Lowella Grip III M.D.   On: 05/18/2017 09:50    Assessment & Plan:   There are no diagnoses linked to this encounter. I have discontinued Royetta Crochet. Hawn "MaryBeth"'s ciprofloxacin. I am also having her maintain her metronidazole, multivitamin with minerals, cholecalciferol, Omega-3 Fatty Acids (FISH OIL CONCENTRATE PO), PRESERVISION AREDS, and irbesartan-hydrochlorothiazide.  No orders of the defined types were placed in this encounter.    Follow-up: No Follow-up on file.  Walker Kehr, MD

## 2017-06-05 NOTE — Assessment & Plan Note (Addendum)
No change D/c Avalide 300/12.5 and start Avapro 150 qd RTC 3 mo

## 2017-06-05 NOTE — Patient Instructions (Signed)
Stop Avalide 300/12.5 and start Avapro 150 qd

## 2017-07-17 ENCOUNTER — Inpatient Hospital Stay: Payer: Medicare Other | Attending: Hematology and Oncology

## 2017-07-17 DIAGNOSIS — Z86000 Personal history of in-situ neoplasm of breast: Secondary | ICD-10-CM | POA: Insufficient documentation

## 2017-07-17 DIAGNOSIS — Z79899 Other long term (current) drug therapy: Secondary | ICD-10-CM | POA: Insufficient documentation

## 2017-07-17 DIAGNOSIS — D7589 Other specified diseases of blood and blood-forming organs: Secondary | ICD-10-CM | POA: Diagnosis not present

## 2017-07-17 DIAGNOSIS — Z923 Personal history of irradiation: Secondary | ICD-10-CM | POA: Insufficient documentation

## 2017-07-17 DIAGNOSIS — Z86010 Personal history of colonic polyps: Secondary | ICD-10-CM | POA: Diagnosis not present

## 2017-07-17 DIAGNOSIS — D519 Vitamin B12 deficiency anemia, unspecified: Secondary | ICD-10-CM | POA: Insufficient documentation

## 2017-07-17 LAB — CBC WITH DIFFERENTIAL (CANCER CENTER ONLY)
BASOS ABS: 0 10*3/uL (ref 0.0–0.1)
Basophils Relative: 1 %
Eosinophils Absolute: 0.1 10*3/uL (ref 0.0–0.5)
Eosinophils Relative: 2 %
HEMATOCRIT: 31.5 % — AB (ref 34.8–46.6)
HEMOGLOBIN: 10.6 g/dL — AB (ref 11.6–15.9)
LYMPHS PCT: 19 %
Lymphs Abs: 0.8 10*3/uL — ABNORMAL LOW (ref 0.9–3.3)
MCH: 30.7 pg (ref 25.1–34.0)
MCHC: 33.8 g/dL (ref 31.5–36.0)
MCV: 90.7 fL (ref 79.5–101.0)
MONO ABS: 0.3 10*3/uL (ref 0.1–0.9)
Monocytes Relative: 7 %
NEUTROS ABS: 3.2 10*3/uL (ref 1.5–6.5)
Neutrophils Relative %: 71 %
Platelet Count: 229 10*3/uL (ref 145–400)
RBC: 3.47 MIL/uL — ABNORMAL LOW (ref 3.70–5.45)
RDW: 12.9 % (ref 11.2–14.5)
WBC Count: 4.4 10*3/uL (ref 3.9–10.3)

## 2017-07-17 LAB — FERRITIN: Ferritin: 42 ng/mL (ref 9–269)

## 2017-07-17 LAB — IRON AND TIBC
Iron: 76 ug/dL (ref 41–142)
Saturation Ratios: 27 % (ref 21–57)
TIBC: 284 ug/dL (ref 236–444)
UIBC: 208 ug/dL

## 2017-07-17 LAB — CMP (CANCER CENTER ONLY)
ALK PHOS: 76 U/L (ref 40–150)
ALT: 15 U/L (ref 0–55)
AST: 15 U/L (ref 5–34)
Albumin: 4 g/dL (ref 3.5–5.0)
Anion gap: 7 (ref 3–11)
BILIRUBIN TOTAL: 0.4 mg/dL (ref 0.2–1.2)
BUN: 30 mg/dL — AB (ref 7–26)
CALCIUM: 9.9 mg/dL (ref 8.4–10.4)
CO2: 28 mmol/L (ref 22–29)
CREATININE: 1.14 mg/dL — AB (ref 0.60–1.10)
Chloride: 104 mmol/L (ref 98–109)
GFR, Est AFR Am: 52 mL/min — ABNORMAL LOW (ref >60)
GFR, Estimated: 45 mL/min — ABNORMAL LOW (ref >60)
Glucose, Bld: 94 mg/dL (ref 70–140)
POTASSIUM: 4.3 mmol/L (ref 3.5–5.1)
Sodium: 139 mmol/L (ref 136–145)
TOTAL PROTEIN: 7.1 g/dL (ref 6.4–8.3)

## 2017-07-17 LAB — VITAMIN B12: Vitamin B-12: 570 pg/mL (ref 180–914)

## 2017-07-20 LAB — METHYLMALONIC ACID, SERUM: Methylmalonic Acid, Quantitative: 306 nmol/L (ref 0–378)

## 2017-07-21 ENCOUNTER — Telehealth: Payer: Self-pay | Admitting: Hematology and Oncology

## 2017-07-21 ENCOUNTER — Encounter: Payer: Self-pay | Admitting: Hematology and Oncology

## 2017-07-21 ENCOUNTER — Inpatient Hospital Stay (HOSPITAL_BASED_OUTPATIENT_CLINIC_OR_DEPARTMENT_OTHER): Payer: Medicare Other | Admitting: Hematology and Oncology

## 2017-07-21 VITALS — BP 151/63 | HR 59 | Temp 98.6°F | Resp 18 | Ht 65.0 in | Wt 129.1 lb

## 2017-07-21 DIAGNOSIS — D519 Vitamin B12 deficiency anemia, unspecified: Secondary | ICD-10-CM | POA: Diagnosis not present

## 2017-07-21 DIAGNOSIS — Z923 Personal history of irradiation: Secondary | ICD-10-CM | POA: Diagnosis not present

## 2017-07-21 DIAGNOSIS — Z79899 Other long term (current) drug therapy: Secondary | ICD-10-CM | POA: Diagnosis not present

## 2017-07-21 DIAGNOSIS — D7589 Other specified diseases of blood and blood-forming organs: Secondary | ICD-10-CM

## 2017-07-21 DIAGNOSIS — Z86000 Personal history of in-situ neoplasm of breast: Secondary | ICD-10-CM | POA: Diagnosis not present

## 2017-07-21 DIAGNOSIS — Z86010 Personal history of colonic polyps: Secondary | ICD-10-CM

## 2017-07-21 NOTE — Telephone Encounter (Signed)
Return if symptoms worsen or fail to improve.per 4/23

## 2017-08-09 NOTE — Assessment & Plan Note (Signed)
77 y.o. female with mild, gradually progressive normocytic, normochromic anemia with trend towards macrocytosis over the past 2 years.  Some associated symptoms of hemorrhoidal bleeding, but also progressive fatigue, and occasional paresthesias in the right upper extremity.    Laboratory evaluation revealed mild B12 deficiency which is likely responsible for the patient's anemia.  Initiation of oral B12 supplementation resolve the previous symptoms of paresthesias and fatigue.  There has been no significant change in the hemoglobin level, but the trend towards microcytosis appears to have reversed.  Vitamin B12 levels have improved sufficiently.  Plan: -Continue oral B12 supplementation 1000 mcg daily -Further follow-up of the blood counts with primary care provider, recommend obtaining CBC every 4 to 6 months and referral back to hematology if blood counts worsen unexpectedly or fail to improve.

## 2017-08-09 NOTE — Progress Notes (Signed)
[facility] Follow-up Visit:  Assessment: Anemia due to vitamin B12 deficiency 77 y.o. female with mild, gradually progressive normocytic, normochromic anemia with trend towards macrocytosis over the past 2 years.  Some associated symptoms of hemorrhoidal bleeding, but also progressive fatigue, and occasional paresthesias in the right upper extremity.    Laboratory evaluation revealed mild B12 deficiency which is likely responsible for the patient's anemia.  Initiation of oral B12 supplementation resolve the previous symptoms of paresthesias and fatigue.  There has been no significant change in the hemoglobin level, but the trend towards microcytosis appears to have reversed.  Vitamin B12 levels have improved sufficiently.  Plan: -Continue oral B12 supplementation 1000 mcg daily -Further follow-up of the blood counts with primary care provider, recommend obtaining CBC every 4 to 6 months and referral back to hematology if blood counts worsen unexpectedly or fail to improve.  Voice recognition software was used and creation of this note. Despite my best effort at editing the text, some misspelling/errors may have occurred.  No orders of the defined types were placed in this encounter.   Cancer Staging No matching staging information was found for the patient.  All questions were answered.   The patient knows to call the clinic with any problems, questions or concerns.  This note was electronically signed.    History of Presenting Illness Caroline Olson is a 77 y.o. followed in the [facility] for diagnosis of anemia, referred by Dr Gatha Mayer.  Please see hematological history below for details.  Patient's past medical history is significant for DCIS of the breast diagnosed in 2011 treated with surgery, radiation therapy, no adjuvant aromatase inhibitor.  Patient also has history of adenomatous colon polyps detected on colonoscopy in 2014 with the next  colonoscopy scheduled next year.  Other than that, patient is remarkably healthy.   Patient denies epistaxis, hemoptysis, hematemesis, melena.  Patient has no hematuria or vaginal bleeding.  No easy bruisability or swollen lymph nodes.  Patient does have history of bleeding hemorrhoids which are currently not bothering her too much. the initial onset of bleeding was in July 2018, most recent episode preceded the lab work obtained on 02/27/14 by about 1-2 weeks. In the past, patient was receiving iron supplementation she stopped taking oral iron in October 2018.  At the present time, patient denies any fevers, chills, night sweats. Patient reports a gradual decline in overall energy level associated with mild dyspnea over the past several months accompanying activity.  Patient also has had a bout of numbness in the right hand about 2-3 weeks ago which resolved spontaneously.  Patient returns to the clinic for continued hematological monitoring.  In the interim, patient denies dyspnea on exertion or paresthesias.  Oncological/hematological History: --Labs, 02/14/15: WBC 5.3, Hgb 12.1, MCV 86.8, MCH     ..., RDW 13.6, Plt 247 --Labs, 01/01/17: WBC 6.1, Hgb 11.0, MCV 89.0, MCH 30.6, RDW 12.4, Plt 241 --Labs, 02/27/17: WBC 5.9, Hgb 10.7, MCV 91.8, MCH     ..., RDW 13.2, Plt 250;       Ferritin 45 --Labs, 04/03/17: WBC 6.6, Hgb 10.7, MCV 90.8, MCH 30.8, RDW     ..., Plt 233; Fe 108, FeSat 38%, TIBC 284, Ferritin 54, Folate >20,  Vit B12 263  --Labs, 07/17/17: WBC 4.4, Hgb 10.6, MCV 90.7, MCH 30.7, RDW 12.9, Plt 229; Fe   76, FeSat 27%, TIBC 284, Ferritin 42,    Vit B12 570, MMA 306   Medical History:  Past Medical History:  Diagnosis Date  . Adenomatous colon polyp 2014  . Breast cancer ([place]) 2011   DCIF  . Osteopenia   . Personal history of radiation therapy   . Varicose veins     Surgical History: Past Surgical History:  Procedure Laterality Date  . ABDOMINAL HYSTERECTOMY  1985  .  APPENDECTOMY  1956   pt states appendix was on left side not rt. per surgeon  . BREAST LUMPECTOMY  2011   left, reexcision 1/12  . COLONOSCOPY W/ POLYPECTOMY  2014  . ENDOVENOUS ABLATION SAPHENOUS VEIN W/ LASER Left 01-27-2013   left greater saphenous vein and sclerotherapy left leg by Curt Jews MD  . ENDOVENOUS ABLATION SAPHENOUS VEIN W/ LASER Right 02-10-2013   right greater saphenous vein and sclerotherapy right leg by Curt Jews MD  . ROTATOR CUFF REPAIR Right 2009  . SHOULDER SURGERY  2003   tear  . stab phlebectomy Left 07-21-2013   10-20 incisions left leg by Curt Jews MD  . TRICEPS TENDON REPAIR  2009    Family History: Family History  Problem Relation Age of Onset  . Cancer Mother   . Hypertension Mother   . Other Mother        varicose veins  . Heart disease Father   . Diabetes Father   . Colon cancer Neg Hx     Social History: Social History   Socioeconomic History  . Marital status: Married    Spouse name: Not on file  . Number of children: Not on file  . Years of education: Not on file  . Highest education level: Not on file  Occupational History  . Not on file  Social Needs  . Financial resource strain: Not on file  . Food insecurity:    Worry: Not on file    Inability: Not on file  . Transportation needs:    Medical: Not on file    Non-medical: Not on file  Tobacco Use  . Smoking status: Never Smoker  . Smokeless tobacco: Never Used  Substance and Sexual Activity  . Alcohol use: Yes    Alcohol/week: 3.0 oz    Types: 5 Glasses of wine per week  . Drug use: No  . Sexual activity: Yes    Partners: Male    Birth control/protection: Post-menopausal    Comment: TAH/BSO  Lifestyle  . Physical activity:    Days per week: Not on file    Minutes per session: Not on file  . Stress: Not on file  Relationships  . Social connections:    Talks on phone: Not on file    Gets together: Not on file    Attends religious service: Not on file    Active  member of club or organization: Not on file    Attends meetings of clubs or organizations: Not on file    Relationship status: Not on file  . Intimate partner violence:    Fear of current or ex partner: Not on file    Emotionally abused: Not on file    Physically abused: Not on file    Forced sexual activity: Not on file  Other Topics Concern  . Not on file  Social History Narrative   Patient is married she is retired and has 2 daughters   Some alcohol use no tobacco or drug use    Allergies: Allergies  Allergen Reactions  . Aspirin Other (See Comments)    REACTION: ringing in ears  . Codeine  Sulfate Nausea And Vomiting    Medications:  Current Outpatient Medications  Medication Sig Dispense Refill  . cholecalciferol (VITAMIN D) 1000 UNITS tablet Take 2,000 Units by mouth daily.     . irbesartan (AVAPRO) 150 MG tablet Take 1 tablet (150 mg total) by mouth daily. 90 tablet 3  . irbesartan-hydrochlorothiazide (AVALIDE) 300-12.5 MG tablet Take 1 tablet by mouth daily. 90 tablet 3  . metronidazole (NORITATE) 1 % cream Apply topically 2 (two) times daily.     . Multiple Vitamins-Minerals (MULTIVITAMIN WITH MINERALS) tablet Take 1 tablet by mouth 3 (three) times a week.     . Multiple Vitamins-Minerals (PRESERVISION AREDS) CAPS Take by mouth daily.    . Omega-3 Fatty Acids (FISH OIL CONCENTRATE PO) Take by mouth. 1 tsp daily    . vitamin B-12 (CYANOCOBALAMIN) 250 MCG tablet Take 250 mcg by mouth daily.     No current facility-administered medications for this visit.     Review of Systems: Review of Systems  Constitutional: Positive for fatigue.  Respiratory: Positive for shortness of breath.   Neurological: Positive for numbness.     PHYSICAL EXAMINATION Blood pressure (!) 151/63, pulse (!) 59, temperature 98.6 F (37 C), temperature source Oral, resp. rate 18, height _0  (1.651 m), weight 129 lb 1.6 oz (58.6 kg), last menstrual period 04/01/1983, SpO2 100 %.  ECOG  PERFORMANCE STATUS: 1 - Symptomatic but completely ambulatory  Physical Exam  Constitutional: She is oriented to person, place, and time and well-developed, well-nourished, and in no distress. No distress.  HENT:  Head: Normocephalic and atraumatic.  Mouth/Throat: Oropharynx is clear and moist. No oropharyngeal exudate.  Eyes: Pupils are equal, round, and reactive to light. Conjunctivae and EOM are normal. No scleral icterus.  Neck: No thyromegaly present.  Cardiovascular: Normal rate, regular rhythm and normal heart sounds.  No murmur heard. Pulmonary/Chest: Effort normal and breath sounds normal. No respiratory distress. She has no wheezes. She has no rales.  Abdominal: Soft. Bowel sounds are normal. She exhibits no distension. There is no tenderness. There is no rebound.  Musculoskeletal: She exhibits no edema.  Lymphadenopathy:    She has no cervical adenopathy.  Neurological: She is alert and oriented to person, place, and time. She has normal reflexes. No cranial nerve deficit.  Skin: Skin is warm and dry. No rash noted. She is not diaphoretic. No erythema. No pallor.     LABORATORY DATA: I have personally reviewed the data as listed: Appointment on 07/17/2017  Component Date Value Ref Range Status  . WBC Count 07/17/2017 4.4  3.9 - 10.3 K/uL Final  . RBC 07/17/2017 3.47* 3.70 - 5.45 MIL/uL Final  . Hemoglobin 07/17/2017 10.6* 11.6 - 15.9 g/dL Final  . HCT 07/17/2017 31.5* 34.8 - 46.6 % Final  . MCV 07/17/2017 90.7  79.5 - 101.0 fL Final  . MCH 07/17/2017 30.7  25.1 - 34.0 pg Final  . MCHC 07/17/2017 33.8  31.5 - 36.0 g/dL Final  . RDW 07/17/2017 12.9  11.2 - 14.5 % Final  . Platelet Count 07/17/2017 229  145 - 400 K/uL Final  . Neutrophils Relative % 07/17/2017 71  % Final  . Neutro Abs 07/17/2017 3.2  1.5 - 6.5 K/uL Final  . Lymphocytes Relative 07/17/2017 19  % Final  . Lymphs Abs 07/17/2017 0.8* 0.9 - 3.3 K/uL Final  . Monocytes Relative 07/17/2017 7  % Final  .  Monocytes Absolute 07/17/2017 0.3  0.1 - 0.9 K/uL Final  . Eosinophils Relative  07/17/2017 2  % Final  . Eosinophils Absolute 07/17/2017 0.1  0.0 - 0.5 K/uL Final  . Basophils Relative 07/17/2017 1  % Final  . Basophils Absolute 07/17/2017 0.0  0.0 - 0.1 K/uL Final   Performed at [facility], [place] [address], [place], [place] [address]  . Sodium 07/17/2017 139  136 - 145 mmol/L Final  . Potassium 07/17/2017 4.3  3.5 - 5.1 mmol/L Final  . Chloride 07/17/2017 104  98 - 109 mmol/L Final  . CO2 07/17/2017 28  22 - 29 mmol/L Final  . Glucose, Bld 07/17/2017 94  70 - 140 mg/dL Final  . BUN 07/17/2017 30* 7 - 26 mg/dL Final  . Creatinine 07/17/2017 1.14* 0.60 - 1.10 mg/dL Final  . Calcium 07/17/2017 9.9  8.4 - 10.4 mg/dL Final  . Total Protein 07/17/2017 7.1  6.4 - 8.3 g/dL Final  . Albumin 07/17/2017 4.0  3.5 - 5.0 g/dL Final  . AST 07/17/2017 15  5 - 34 U/L Final  . ALT 07/17/2017 15  0 - 55 U/L Final  . Alkaline Phosphatase 07/17/2017 76  40 - 150 U/L Final  . Total Bilirubin 07/17/2017 0.4  0.2 - 1.2 mg/dL Final  . GFR, Est Non Af Am 07/17/2017 45* >60 mL/min Final  . GFR, Est AFR Am 07/17/2017 52* >60 mL/min Final   Comment: (NOTE) The eGFR has been calculated using the CKD EPI equation. This calculation has not been validated in all clinical situations. eGFR's persistently <60 mL/min signify possible Chronic Kidney Disease.   Georgiann Hahn gap 07/17/2017 7  3 - 11 Final   Performed at [facility], [place] [address] [place], [place] [address]  . Vitamin B-12 07/17/2017 570  180 - 914 pg/mL Final   Comment: (NOTE) This assay is not validated for testing neonatal or myeloproliferative syndrome specimens for Vitamin B12 levels. Performed at [facility], [place] [address], [place], [place] [address]   . Methylmalonic Acid, Quantitative 07/17/2017 306  0 - 378 nmol/L Final  . Disclaimer: 07/17/2017 Comment   Final   Comment: (NOTE) This  test was developed and its performance characteristics determined by [facility]. It has not been cleared or approved by the Food and Drug Administration. Performed At: [facility] [place], [place] [id number] Rush Farmer MD [id number] Performed at [facility], [place] [address], [place], [place] [address]   . Ferritin 07/17/2017 42  9 - 269 ng/mL Final   Performed at [facility], [place] [address] [place], [place] [address]  . Iron 07/17/2017 76  41 - 142 ug/dL Final  . TIBC 07/17/2017 284  236 - 444 ug/dL Final  . Saturation Ratios 07/17/2017 27  21 - 57 % Final  . UIBC 07/17/2017 208  ug/dL Final   Performed at [facility], [place] [address] [place], [place] [address]       Ardath Sax, MD

## 2017-08-11 ENCOUNTER — Ambulatory Visit: Payer: Medicare Other

## 2017-08-17 ENCOUNTER — Ambulatory Visit (INDEPENDENT_AMBULATORY_CARE_PROVIDER_SITE_OTHER): Payer: Medicare Other | Admitting: *Deleted

## 2017-08-17 VITALS — BP 122/64 | HR 59 | Resp 16 | Ht 65.0 in | Wt 128.0 lb

## 2017-08-17 DIAGNOSIS — Z0000 Encounter for general adult medical examination without abnormal findings: Secondary | ICD-10-CM | POA: Diagnosis not present

## 2017-08-17 DIAGNOSIS — Z1211 Encounter for screening for malignant neoplasm of colon: Secondary | ICD-10-CM | POA: Diagnosis not present

## 2017-08-17 NOTE — Patient Instructions (Addendum)
Continue doing brain stimulating activities (puzzles, reading, adult coloring books, staying active) to keep memory sharp.   Continue to eat heart healthy diet (full of fruits, vegetables, whole grains, lean protein, water--limit salt, fat, and sugar intake) and increase physical activity as tolerated.  Caroline Olson , Thank you for taking time to come for your Medicare Wellness Visit. I appreciate your ongoing commitment to your health goals. Please review the following plan we discussed and let me know if I can assist you in the future.   These are the goals we discussed: Goals    . Exercise 150 minutes per week (moderate activity)     Will add low weight bearing exercises 2 times a week     . Patient Stated     Continue to be active, hike weekly, walk daily, eat healthy, travel, enjoy life and family.       This is a list of the screening recommended for you and due dates:  Health Maintenance  Topic Date Due  . Colon Cancer Screening  09/10/2017  . Flu Shot  10/29/2017  . Tetanus Vaccine  07/01/2023  . DEXA scan (bone density measurement)  Completed  . Pneumonia vaccines  Completed   Health Maintenance, Female Adopting a healthy lifestyle and getting preventive care can go a long way to promote health and wellness. Talk with your health care provider about what schedule of regular examinations is right for you. This is a good chance for you to check in with your provider about disease prevention and staying healthy. In between checkups, there are plenty of things you can do on your own. Experts have done a lot of research about which lifestyle changes and preventive measures are most likely to keep you healthy. Ask your health care provider for more information. Weight and diet Eat a healthy diet  Be sure to include plenty of vegetables, fruits, low-fat dairy products, and lean protein.  Do not eat a lot of foods high in solid fats, added sugars, or salt.  Get regular exercise.  This is one of the most important things you can do for your health. ? Most adults should exercise for at least 150 minutes each week. The exercise should increase your heart rate and make you sweat (moderate-intensity exercise). ? Most adults should also do strengthening exercises at least twice a week. This is in addition to the moderate-intensity exercise.  Maintain a healthy weight  Body mass index (BMI) is a measurement that can be used to identify possible weight problems. It estimates body fat based on height and weight. Your health care provider can help determine your BMI and help you achieve or maintain a healthy weight.  For females 4 years of age and older: ? A BMI below 18.5 is considered underweight. ? A BMI of 18.5 to 24.9 is normal. ? A BMI of 25 to 29.9 is considered overweight. ? A BMI of 30 and above is considered obese.  Watch levels of cholesterol and blood lipids  You should start having your blood tested for lipids and cholesterol at 79 years of age, then have this test every 5 years.  You may need to have your cholesterol levels checked more often if: ? Your lipid or cholesterol levels are high. ? You are older than 79 years of age. ? You are at high risk for heart disease.  Cancer screening Lung Cancer  Lung cancer screening is recommended for adults 74-37 years old who are at high risk for  lung cancer because of a history of smoking.  A yearly low-dose CT scan of the lungs is recommended for people who: ? Currently smoke. ? Have quit within the past 15 years. ? Have at least a 30-pack-year history of smoking. A pack year is smoking an average of one pack of cigarettes a day for 1 year.  Yearly screening should continue until it has been 15 years since you quit.  Yearly screening should stop if you develop a health problem that would prevent you from having lung cancer treatment.  Breast Cancer  Practice breast self-awareness. This means understanding  how your breasts normally appear and feel.  It also means doing regular breast self-exams. Let your health care provider know about any changes, no matter how small.  If you are in your 20s or 30s, you should have a clinical breast exam (CBE) by a health care provider every 1-3 years as part of a regular health exam.  If you are 63 or older, have a CBE every year. Also consider having a breast X-ray (mammogram) every year.  If you have a family history of breast cancer, talk to your health care provider about genetic screening.  If you are at high risk for breast cancer, talk to your health care provider about having an MRI and a mammogram every year.  Breast cancer gene (BRCA) assessment is recommended for women who have family members with BRCA-related cancers. BRCA-related cancers include: ? Breast. ? Ovarian. ? Tubal. ? Peritoneal cancers.  Results of the assessment will determine the need for genetic counseling and BRCA1 and BRCA2 testing.  Cervical Cancer Your health care provider may recommend that you be screened regularly for cancer of the pelvic organs (ovaries, uterus, and vagina). This screening involves a pelvic examination, including checking for microscopic changes to the surface of your cervix (Pap test). You may be encouraged to have this screening done every 3 years, beginning at age 7.  For women ages 73-65, health care providers may recommend pelvic exams and Pap testing every 3 years, or they may recommend the Pap and pelvic exam, combined with testing for human papilloma virus (HPV), every 5 years. Some types of HPV increase your risk of cervical cancer. Testing for HPV may also be done on women of any age with unclear Pap test results.  Other health care providers may not recommend any screening for nonpregnant women who are considered low risk for pelvic cancer and who do not have symptoms. Ask your health care provider if a screening pelvic exam is right for  you.  If you have had past treatment for cervical cancer or a condition that could lead to cancer, you need Pap tests and screening for cancer for at least 20 years after your treatment. If Pap tests have been discontinued, your risk factors (such as having a new sexual partner) need to be reassessed to determine if screening should resume. Some women have medical problems that increase the chance of getting cervical cancer. In these cases, your health care provider may recommend more frequent screening and Pap tests.  Colorectal Cancer  This type of cancer can be detected and often prevented.  Routine colorectal cancer screening usually begins at 79 years of age and continues through 79 years of age.  Your health care provider may recommend screening at an earlier age if you have risk factors for colon cancer.  Your health care provider may also recommend using home test kits to check for hidden blood in  the stool.  A small camera at the end of a tube can be used to examine your colon directly (sigmoidoscopy or colonoscopy). This is done to check for the earliest forms of colorectal cancer.  Routine screening usually begins at age 22.  Direct examination of the colon should be repeated every 5-10 years through 79 years of age. However, you may need to be screened more often if early forms of precancerous polyps or small growths are found.  Skin Cancer  Check your skin from head to toe regularly.  Tell your health care provider about any new moles or changes in moles, especially if there is a change in a mole's shape or color.  Also tell your health care provider if you have a mole that is larger than the size of a pencil eraser.  Always use sunscreen. Apply sunscreen liberally and repeatedly throughout the day.  Protect yourself by wearing long sleeves, pants, a wide-brimmed hat, and sunglasses whenever you are outside.  Heart disease, diabetes, and high blood pressure  High blood  pressure causes heart disease and increases the risk of stroke. High blood pressure is more likely to develop in: ? People who have blood pressure in the high end of the normal range (130-139/85-89 mm Hg). ? People who are overweight or obese. ? People who are African American.  If you are 74-68 years of age, have your blood pressure checked every 3-5 years. If you are 25 years of age or older, have your blood pressure checked every year. You should have your blood pressure measured twice-once when you are at a hospital or clinic, and once when you are not at a hospital or clinic. Record the average of the two measurements. To check your blood pressure when you are not at a hospital or clinic, you can use: ? An automated blood pressure machine at a pharmacy. ? A home blood pressure monitor.  If you are between 23 years and 35 years old, ask your health care provider if you should take aspirin to prevent strokes.  Have regular diabetes screenings. This involves taking a blood sample to check your fasting blood sugar level. ? If you are at a normal weight and have a low risk for diabetes, have this test once every three years after 79 years of age. ? If you are overweight and have a high risk for diabetes, consider being tested at a younger age or more often. Preventing infection Hepatitis B  If you have a higher risk for hepatitis B, you should be screened for this virus. You are considered at high risk for hepatitis B if: ? You were born in a country where hepatitis B is common. Ask your health care provider which countries are considered high risk. ? Your parents were born in a high-risk country, and you have not been immunized against hepatitis B (hepatitis B vaccine). ? You have HIV or AIDS. ? You use needles to inject street drugs. ? You live with someone who has hepatitis B. ? You have had sex with someone who has hepatitis B. ? You get hemodialysis treatment. ? You take certain  medicines for conditions, including cancer, organ transplantation, and autoimmune conditions.  Hepatitis C  Blood testing is recommended for: ? Everyone born from [birth year] through 1967. ? Anyone with known risk factors for hepatitis C.  Sexually transmitted infections (STIs)  You should be screened for sexually transmitted infections (STIs) including gonorrhea and chlamydia if: ? You are sexually active and are  younger than 79 years of age. ? You are older than 79 years of age and your health care provider tells you that you are at risk for this type of infection. ? Your sexual activity has changed since you were last screened and you are at an increased risk for chlamydia or gonorrhea. Ask your health care provider if you are at risk.  If you do not have HIV, but are at risk, it may be recommended that you take a prescription medicine daily to prevent HIV infection. This is called pre-exposure prophylaxis (PrEP). You are considered at risk if: ? You are sexually active and do not regularly use condoms or know the HIV status of your partner(s). ? You take drugs by injection. ? You are sexually active with a partner who has HIV.  Talk with your health care provider about whether you are at high risk of being infected with HIV. If you choose to begin PrEP, you should first be tested for HIV. You should then be tested every 3 months for as long as you are taking PrEP. Pregnancy  If you are premenopausal and you may become pregnant, ask your health care provider about preconception counseling.  If you may become pregnant, take 400 to 800 micrograms (mcg) of folic acid every day.  If you want to prevent pregnancy, talk to your health care provider about birth control (contraception). Osteoporosis and menopause  Osteoporosis is a disease in which the bones lose minerals and strength with aging. This can result in serious bone fractures. Your risk for osteoporosis can be identified using a bone  density scan.  If you are 29 years of age or older, or if you are at risk for osteoporosis and fractures, ask your health care provider if you should be screened.  Ask your health care provider whether you should take a calcium or vitamin D supplement to lower your risk for osteoporosis.  Menopause may have certain physical symptoms and risks.  Hormone replacement therapy may reduce some of these symptoms and risks. Talk to your health care provider about whether hormone replacement therapy is right for you. Follow these instructions at home:  Schedule regular health, dental, and eye exams.  Stay current with your immunizations.  Do not use any tobacco products including cigarettes, chewing tobacco, or electronic cigarettes.  If you are pregnant, do not drink alcohol.  If you are breastfeeding, limit how much and how often you drink alcohol.  Limit alcohol intake to no more than 1 drink per day for nonpregnant women. One drink equals 12 ounces of beer, 5 ounces of Rockie Schnoor, or 1 ounces of hard liquor.  Do not use street drugs.  Do not share needles.  Ask your health care provider for help if you need support or information about quitting drugs.  Tell your health care provider if you often feel depressed.  Tell your health care provider if you have ever been abused or do not feel safe at home. This information is not intended to replace advice given to you by your health care provider. Make sure you discuss any questions you have with your health care provider. Document Released: 09/30/2010 Document Revised: 08/23/2015 Document Reviewed: 12/19/2014 Elsevier Interactive Patient Education  [employer].

## 2017-08-17 NOTE — Progress Notes (Addendum)
Subjective:   Caroline Olson is a 77 y.o. female who presents for Medicare Annual (Subsequent) preventive examination.  Review of Systems:  No ROS.  Medicare Wellness Visit. Additional risk factors are reflected in the social history.  Cardiac Risk Factors include: advanced age (>76men, >60 women);hypertension Sleep patterns: no sleep issues, feels rested on waking, gets up 1 times nightly to void and sleeps 7-8 hours nightly.    Home Safety/Smoke Alarms: Feels safe in home. Smoke alarms in place.  Living environment; residence and Firearm Safety: 2-story house, no firearms. Lives with husband, no needs for DME, good support system Seat Belt Safety/Bike Helmet: Wears seat belt.     Objective:     Vitals: BP 122/64   Pulse (!) 59   Resp 16   Ht 5\' 5"  (1.651 m)   Wt 128 lb (58.1 kg)   LMP 04/01/1983   SpO2 99%   BMI 21.30 kg/m   Body mass index is 21.3 kg/m.  Advanced Directives 08/17/2017 08/04/2016 02/05/2015  Does Patient Have a Medical Advance Directive? Yes Yes Yes  Type of Paramedic of [place];Living will [place];Living will -  Does patient want to make changes to medical advance directive? - Yes (ED - Information included in AVS) -  Copy of [place] in Chart? No - copy requested Yes Yes    Tobacco Social History   Tobacco Use  Smoking Status Never Smoker  Smokeless Tobacco Never Used     Counseling given: Not Answered  Past Medical History:  Diagnosis Date  . Adenomatous colon polyp 2014  . Breast cancer ([place]) 2011   DCIF  . Osteopenia   . Personal history of radiation therapy   . Varicose veins    Past Surgical History:  Procedure Laterality Date  . ABDOMINAL HYSTERECTOMY  1985  . APPENDECTOMY  1956   pt states appendix was on left side not rt. per surgeon  . BREAST LUMPECTOMY  2011   left, reexcision 1/12  . COLONOSCOPY W/ POLYPECTOMY  2014  . ENDOVENOUS ABLATION SAPHENOUS VEIN W/  LASER Left 01-27-2013   left greater saphenous vein and sclerotherapy left leg by Curt Jews MD  . ENDOVENOUS ABLATION SAPHENOUS VEIN W/ LASER Right 02-10-2013   right greater saphenous vein and sclerotherapy right leg by Curt Jews MD  . ROTATOR CUFF REPAIR Right 2009  . SHOULDER SURGERY  2003   tear  . stab phlebectomy Left 07-21-2013   10-20 incisions left leg by Curt Jews MD  . TRICEPS TENDON REPAIR  2009   Family History  Problem Relation Age of Onset  . Cancer Mother   . Hypertension Mother   . Other Mother        varicose veins  . Heart disease Father   . Diabetes Father   . Colon cancer Neg Hx    Social History   Socioeconomic History  . Marital status: Married    Spouse name: Not on file  . Number of children: 2  . Years of education: Not on file  . Highest education level: Not on file  Occupational History  . Not on file  Social Needs  . Financial resource strain: Not hard at all  . Food insecurity:    Worry: Never true    Inability: Never true  . Transportation needs:    Medical: No    Non-medical: No  Tobacco Use  . Smoking status: Never Smoker  . Smokeless tobacco:  Never Used  Substance and Sexual Activity  . Alcohol use: Yes    Alcohol/week: 3.0 oz    Types: 5 Glasses of wine per week  . Drug use: No  . Sexual activity: Yes    Partners: Male    Birth control/protection: Post-menopausal    Comment: TAH/BSO  Lifestyle  . Physical activity:    Days per week: 6 days    Minutes per session: 50 min  . Stress: To some extent  Relationships  . Social connections:    Talks on phone: More than three times a week    Gets together: More than three times a week    Attends religious service: More than 4 times per year    Active member of club or organization: Yes    Attends meetings of clubs or organizations: More than 4 times per year    Relationship status: Married  Other Topics Concern  . Not on file  Social History Narrative   Patient is married  she is retired and has 2 daughters   Some alcohol use no tobacco or drug use    Outpatient Encounter Medications as of 08/17/2017  Medication Sig  . cholecalciferol (VITAMIN D) 1000 UNITS tablet Take 2,000 Units by mouth daily.   . irbesartan (AVAPRO) 150 MG tablet Take 1 tablet (150 mg total) by mouth daily.  . metronidazole (NORITATE) 1 % cream Apply topically 2 (two) times daily.   . Multiple Vitamins-Minerals (MULTIVITAMIN WITH MINERALS) tablet Take 1 tablet by mouth 3 (three) times a week.   . Multiple Vitamins-Minerals (PRESERVISION AREDS) CAPS Take by mouth daily.  . Omega-3 Fatty Acids (FISH OIL CONCENTRATE PO) Take by mouth. 1 tsp daily  . vitamin B-12 (CYANOCOBALAMIN) 250 MCG tablet Take 250 mcg by mouth daily.  . [DISCONTINUED] irbesartan-hydrochlorothiazide (AVALIDE) 300-12.5 MG tablet Take 1 tablet by mouth daily. (Patient not taking: Reported on 08/17/2017)   No facility-administered encounter medications on file as of 08/17/2017.     Activities of Daily Living In your present state of health, do you have any difficulty performing the following activities: 08/17/2017  Hearing? N  Vision? N  Difficulty concentrating or making decisions? N  Walking or climbing stairs? N  Dressing or bathing? N  Doing errands, shopping? N  Preparing Food and eating ? N  Using the Toilet? N  In the past six months, have you accidently leaked urine? N  Do you have problems with loss of bowel control? N  Managing your Medications? N  Managing your Finances? N  Housekeeping or managing your Housekeeping? N  Some recent data might be hidden    Patient Care Team: Plotnikov, Evie Lacks, MD as PCP - General Magrinat, Virgie Dad, MD (Hematology and Oncology) Coralie Keens, MD (General Surgery) Megan Salon, MD as Attending Physician (Gynecology) Gavin Pound, MD as Consulting Physician (Rheumatology) Almedia Balls, MD as Consulting Physician (Orthopedic Surgery)    Assessment:   This is  a routine wellness examination for [facility]. Physical assessment deferred to PCP.   Exercise Activities and Dietary recommendations Current Exercise Habits: Home exercise routine, Type of exercise: walking(hiking), Time (Minutes): 40, Frequency (Times/Week): 6, Weekly Exercise (Minutes/Week): 240, Intensity: Mild  Diet (meal preparation, eat out, water intake, caffeinated beverages, dairy products, fruits and vegetables): in general, a "healthy" diet  , well balanced, eats a variety of fruits and vegetables daily, limits salt, fat/cholesterol, sugar,carbohydrates,caffeine, drinks 4-5 glasses of water daily. Encouraged patient to increase daily water intake.  Goals    .  Exercise 150 minutes per week (moderate activity)     Will add low weight bearing exercises 2 times a week     . Patient Stated     Continue to be active, hike weekly, walk daily, eat healthy, travel, enjoy life and family.       Fall Risk Fall Risk  08/17/2017 08/04/2016 06/20/2016 03/07/2016 02/05/2015  Falls in the past year? No Yes Yes No No  Comment - - - Emmi Telephone Survey: data to providers prior to load -  Number falls in past yr: - 2 or more 2 or more - -  Injury with Fall? - No No - -    Depression Screen PHQ 2/9 Scores 08/17/2017 08/04/2016 06/20/2016 02/05/2015  PHQ - 2 Score 0 0 0 0  PHQ- 9 Score 0 - - -     Cognitive Function MMSE - Mini Mental State Exam 08/17/2017  Orientation to time 5  Orientation to Place 5  Registration 3  Attention/ Calculation 5  Recall 3  Language- name 2 objects 2  Language- repeat 1  Language- follow 3 step command 3  Language- read & follow direction 1  Write a sentence 1  Copy design 1  Total score 30        Immunization History  Administered Date(s) Administered  . Influenza Split 02/13/2015  . Influenza,inj,Quad PF,6+ Mos 02/09/2016, 01/30/2017  . Influenza-Unspecified 01/18/2013, 01/12/2014, 01/30/2016  . Pneumococcal Conjugate-13 01/25/2014  . Pneumococcal  Polysaccharide-23 07/08/2009  . Td 05/30/2003  . Tdap 06/30/2013  . Zoster 06/07/2008  . Zoster Recombinat (Shingrix) 11/11/2016, 01/01/2017   Screening Tests Health Maintenance  Topic Date Due  . COLONOSCOPY  09/10/2017  . INFLUENZA VACCINE  10/29/2017  . TETANUS/TDAP  07/01/2023  . DEXA SCAN  Completed  . PNA vac Low Risk Adult  Completed      Plan:    Continue doing brain stimulating activities (puzzles, reading, adult coloring books, staying active) to keep memory sharp.   Continue to eat heart healthy diet (full of fruits, vegetables, whole grains, lean protein, water--limit salt, fat, and sugar intake) and increase physical activity as tolerated.  I have personally reviewed and noted the following in the patient's chart:   . Medical and social history . Use of alcohol, tobacco or illicit drugs  . Current medications and supplements . Functional ability and status . Nutritional status . Physical activity . Advanced directives . List of other physicians . Vitals . Screenings to include cognitive, depression, and falls . Referrals and appointments  In addition, I have reviewed and discussed with patient certain preventive protocols, quality metrics, and best practice recommendations. A written personalized care plan for preventive services as well as general preventive health recommendations were provided to patient.     Michiel Cowboy, RN  08/17/2017   Medical screening examination/treatment/procedure(s) were performed by non-physician practitioner and as supervising physician I was immediately available for consultation/collaboration. I agree with above. Lew Dawes, MD

## 2017-08-18 DIAGNOSIS — L738 Other specified follicular disorders: Secondary | ICD-10-CM | POA: Diagnosis not present

## 2017-08-18 DIAGNOSIS — D1801 Hemangioma of skin and subcutaneous tissue: Secondary | ICD-10-CM | POA: Diagnosis not present

## 2017-09-09 ENCOUNTER — Encounter: Payer: Self-pay | Admitting: Internal Medicine

## 2017-09-09 ENCOUNTER — Ambulatory Visit (INDEPENDENT_AMBULATORY_CARE_PROVIDER_SITE_OTHER): Payer: Medicare Other | Admitting: Internal Medicine

## 2017-09-09 ENCOUNTER — Other Ambulatory Visit (INDEPENDENT_AMBULATORY_CARE_PROVIDER_SITE_OTHER): Payer: Medicare Other

## 2017-09-09 DIAGNOSIS — D519 Vitamin B12 deficiency anemia, unspecified: Secondary | ICD-10-CM

## 2017-09-09 DIAGNOSIS — I10 Essential (primary) hypertension: Secondary | ICD-10-CM

## 2017-09-09 DIAGNOSIS — R7989 Other specified abnormal findings of blood chemistry: Secondary | ICD-10-CM | POA: Diagnosis not present

## 2017-09-09 LAB — BASIC METABOLIC PANEL
BUN: 27 mg/dL — AB (ref 6–23)
CALCIUM: 10.1 mg/dL (ref 8.4–10.5)
CHLORIDE: 103 meq/L (ref 96–112)
CO2: 29 meq/L (ref 19–32)
CREATININE: 1.02 mg/dL (ref 0.40–1.20)
GFR: 55.59 mL/min — ABNORMAL LOW (ref >60.00)
GLUCOSE: 106 mg/dL — AB (ref 70–99)
Potassium: 4.6 mEq/L (ref 3.5–5.1)
Sodium: 139 mEq/L (ref 135–145)

## 2017-09-09 NOTE — Progress Notes (Signed)
Subjective:  Patient ID: Caroline Olson, female    DOB: 03/10/1941  Age: 77 y.o. MRN: [id number]  CC: No chief complaint on file.   HPI TRINNITY BREUNIG presents for HTN, anemia, B12 def, elev glu  Outpatient Medications Prior to Visit  Medication Sig Dispense Refill  . cholecalciferol (VITAMIN D) 1000 UNITS tablet Take 2,000 Units by mouth daily.     . irbesartan (AVAPRO) 150 MG tablet Take 1 tablet (150 mg total) by mouth daily. 90 tablet 3  . metronidazole (NORITATE) 1 % cream Apply topically 2 (two) times daily.     . Multiple Vitamins-Minerals (MULTIVITAMIN WITH MINERALS) tablet Take 1 tablet by mouth 3 (three) times a week.     . Multiple Vitamins-Minerals (PRESERVISION AREDS) CAPS Take by mouth daily.    . Omega-3 Fatty Acids (FISH OIL CONCENTRATE PO) Take by mouth. 1 tsp daily    . vitamin B-12 (CYANOCOBALAMIN) 250 MCG tablet Take 250 mcg by mouth daily.     No facility-administered medications prior to visit.     ROS: Review of Systems  Constitutional: Negative for activity change, appetite change, chills, fatigue and unexpected weight change.  HENT: Negative for congestion, mouth sores and sinus pressure.   Eyes: Negative for visual disturbance.  Respiratory: Negative for cough and chest tightness.   Gastrointestinal: Negative for abdominal pain and nausea.  Genitourinary: Negative for difficulty urinating, frequency and vaginal pain.  Musculoskeletal: Negative for back pain and gait problem.  Skin: Negative for pallor and rash.  Neurological: Negative for dizziness, tremors, weakness, numbness and headaches.  Psychiatric/Behavioral: Negative for confusion and sleep disturbance.    Objective:  BP 128/70 (BP Location: Left Arm, Patient Position: Sitting, Cuff Size: Normal)   Pulse 62   Temp 99 F (37.2 C) (Oral)   Ht 5\' 5"  (1.651 m)   Wt 126 lb (57.2 kg)   LMP 04/01/1983   SpO2 98%   BMI 20.97 kg/m   BP Readings from Last 3 Encounters:  09/09/17 128/70    08/17/17 122/64  07/21/17 (!) 151/63    Wt Readings from Last 3 Encounters:  09/09/17 126 lb (57.2 kg)  08/17/17 128 lb (58.1 kg)  07/21/17 129 lb 1.6 oz (58.6 kg)    Physical Exam  Constitutional: She appears well-developed. No distress.  HENT:  Head: Normocephalic.  Right Ear: External ear normal.  Left Ear: External ear normal.  Nose: Nose normal.  Mouth/Throat: Oropharynx is clear and moist.  Eyes: Pupils are equal, round, and reactive to light. Conjunctivae are normal. Right eye exhibits no discharge. Left eye exhibits no discharge.  Neck: Normal range of motion. Neck supple. No JVD present. No tracheal deviation present. No thyromegaly present.  Cardiovascular: Normal rate, regular rhythm and normal heart sounds.  Pulmonary/Chest: No stridor. No respiratory distress. She has no wheezes.  Abdominal: Soft. Bowel sounds are normal. She exhibits no distension and no mass. There is no tenderness. There is no rebound and no guarding.  Musculoskeletal: She exhibits no edema or tenderness.  Lymphadenopathy:    She has no cervical adenopathy.  Neurological: She displays normal reflexes. No cranial nerve deficit. She exhibits normal muscle tone. Coordination normal.  Skin: No rash noted. No erythema.  Psychiatric: She has a normal mood and affect. Her behavior is normal. Judgment and thought content normal.    Lab Results  Component Value Date   WBC 4.4 07/17/2017   HGB 10.6 (L) 07/17/2017   HCT 31.5 (L) 07/17/2017   PLT  229 07/17/2017   GLUCOSE 106 (H) 09/09/2017   CHOL 209 (H) 06/01/2017   TRIG 57.0 06/01/2017   HDL 83.10 06/01/2017   LDLDIRECT 104.3 06/07/2008   LDLCALC 114 (H) 06/01/2017   ALT 15 07/17/2017   AST 15 07/17/2017   NA 139 09/09/2017   K 4.6 09/09/2017   CL 103 09/09/2017   CREATININE 1.02 09/09/2017   BUN 27 (H) 09/09/2017   CO2 29 09/09/2017   TSH 0.454 04/03/2017    US Renal  Result Date: 05/18/2017 CLINICAL DATA:  Hematuria with elevated  serum creatinine EXAM: RENAL / URINARY TRACT ULTRASOUND COMPLETE COMPARISON:  None. FINDINGS: Right Kidney: Length: 9.6 cm. Echogenicity and renal cortical thickness are within normal limits. No mass, perinephric fluid, or hydronephrosis visualized. No sonographically demonstrable calculus or ureterectasis. Left Kidney: Length: 9.7 cm. Echogenicity and renal cortical thickness are within normal limits. No mass, perinephric fluid, or hydronephrosis visualized. No sonographically demonstrable calculus or ureterectasis. Bladder: Appears normal for degree of bladder distention. IMPRESSION: Study within normal limits. Electronically Signed   By: Lowella Grip III M.D.   On: 05/18/2017 09:50    Assessment & Plan:   There are no diagnoses linked to this encounter.   No orders of the defined types were placed in this encounter.    Follow-up: No follow-ups on file.  Walker Kehr, MD

## 2017-09-09 NOTE — Assessment & Plan Note (Signed)
Blood donor 

## 2017-09-09 NOTE — Assessment & Plan Note (Signed)
Avapro 

## 2017-09-11 DIAGNOSIS — M064 Inflammatory polyarthropathy: Secondary | ICD-10-CM | POA: Diagnosis not present

## 2017-09-11 DIAGNOSIS — Z79899 Other long term (current) drug therapy: Secondary | ICD-10-CM | POA: Diagnosis not present

## 2017-09-11 DIAGNOSIS — Z6820 Body mass index (BMI) 20.0-20.9, adult: Secondary | ICD-10-CM | POA: Diagnosis not present

## 2017-09-11 DIAGNOSIS — M25561 Pain in right knee: Secondary | ICD-10-CM | POA: Diagnosis not present

## 2017-10-21 ENCOUNTER — Other Ambulatory Visit: Payer: Self-pay | Admitting: Internal Medicine

## 2017-10-21 NOTE — Telephone Encounter (Signed)
Copied from [place] [id number]. Topic: Quick Communication - Rx Refill/Question >> Oct 21, 2017  3:33 PM Synthia Innocent wrote: Medication: irbesartan (AVAPRO) 150 MG tablet, requesting 300mg , price is much cheaper  Has the patient contacted their pharmacy? Yes.   (Agent: If no, request that the patient contact the pharmacy for the refill.) (Agent: If yes, when and what did the pharmacy advise?)  Preferred Pharmacy (with phone number or street name): [facility]  Agent: Please be advised that RX refills may take up to 3 business days. We ask that you follow-up with your pharmacy.

## 2017-10-22 NOTE — Telephone Encounter (Signed)
Patient requesting a new prescription Irbesartan 300 mg tablets because they are cheaper.  Last OV:09/09/17 MVH:[id number] Pharmacy: [place], [place]. [phone] (Phone) [phone] (Fax)

## 2017-10-28 MED ORDER — IRBESARTAN 150 MG PO TABS: 150.0000 mg | ORAL_TABLET | Freq: Every day | ORAL | 3 refills | Status: DC

## 2017-11-10 ENCOUNTER — Encounter: Payer: Self-pay | Admitting: *Deleted

## 2017-11-16 ENCOUNTER — Encounter: Payer: Self-pay | Admitting: Internal Medicine

## 2018-01-14 ENCOUNTER — Ambulatory Visit: Payer: Self-pay | Admitting: *Deleted

## 2018-01-14 NOTE — Telephone Encounter (Signed)
Please advice if patient can take OTC Claritin

## 2018-01-14 NOTE — Telephone Encounter (Signed)
Patient has question regarding taking claritin. Reports she has had mild renal impairment in the past. 09/09/17 labs:  BUN 27 GFR 55 Last liver function tests 04/07/17 showed no impairment.  Recommended she take one claritin/day for no more than a few days. Advised to drink plenty of water with the antihistamine.   Reason for Disposition . Caller has medication question only, adult not sick, and triager answers question  Answer Assessment - Initial Assessment Questions 1. SYMPTOMS: "Do you have any symptoms?"     Runny nose, sneezing and itchy eyes 2. SEVERITY: If symptoms are present, ask "Are they mild, moderate or severe?"     mild  Protocols used: MEDICATION QUESTION CALL-A-AH

## 2018-01-15 NOTE — Telephone Encounter (Signed)
Yes- I am okay with her taking Claritin for a few days as the triage nurse told her.

## 2018-01-15 NOTE — Telephone Encounter (Signed)
Please advise in Dr. Plotnikovs absence 

## 2018-01-22 ENCOUNTER — Encounter (INDEPENDENT_AMBULATORY_CARE_PROVIDER_SITE_OTHER): Payer: Self-pay

## 2018-01-22 ENCOUNTER — Ambulatory Visit (INDEPENDENT_AMBULATORY_CARE_PROVIDER_SITE_OTHER): Payer: Medicare Other | Admitting: Internal Medicine

## 2018-01-22 ENCOUNTER — Encounter: Payer: Self-pay | Admitting: Internal Medicine

## 2018-01-22 VITALS — BP 152/74 | HR 68 | Ht 65.0 in | Wt 124.5 lb

## 2018-01-22 DIAGNOSIS — Z86010 Personal history of colonic polyps: Secondary | ICD-10-CM

## 2018-01-22 NOTE — Progress Notes (Signed)
Caroline Olson 77 y.o. [birth year] [id number]  Assessment & Plan:   Encounter Diagnosis  Name Primary?  . History of adenomatous polyp of colon Yes     Plan for a surveillance colonoscopy with pediatric colonoscope given her petite frame.  She is elderly and the reason I am recommending the colonoscopy is to ensure complete removal of the polyp and given her excellent functional status despite her age I think this is reasonable and she understands and agrees to proceed. The risks and benefits as well as alternatives of endoscopic procedure(s) have been discussed and reviewed. All questions answered. The patient agrees to proceed.  GY:IRSWNIOEV, Evie Lacks, MD  Subjective:   Chief Complaint: History of colon polyp  HPI Patient is a very nice 77 year old white woman I met late last year with bleeding hemorrhoids that resolved.  Mild chronic anemia and ended up having B12 deficiency.  She was seen by hematology and has started B12 supplementation and that has improved.  Her hemorrhoids do not seem to bother her much if at all anymore.  He did have a cecal adenoma that was removed with biopsy forceps though when I look at the photos are not 100% confident it was removed. Allergies  Allergen Reactions  . Aspirin Other (See Comments)    REACTION: ringing in ears  . Codeine Sulfate Nausea And Vomiting   Current Meds  Medication Sig  . Cholecalciferol (VITAMIN D) 2000 units tablet Take 2,000 Units by mouth daily.  . cyanocobalamin 1000 MCG tablet Take 1,000 mcg by mouth daily.  . irbesartan (AVAPRO) 150 MG tablet Take 1 tablet (150 mg total) by mouth daily.  . metronidazole (NORITATE) 1 % cream Apply topically 2 (two) times daily.   . Multiple Vitamins-Minerals (MULTIVITAMIN WITH MINERALS) tablet Take 1 tablet by mouth 3 (three) times a week.   . NON FORMULARY [place] Retina po qd  . Omega-3 Fatty Acids (FISH OIL CONCENTRATE PO) Take by mouth. 1 tsp daily  . [DISCONTINUED] Multiple  Vitamins-Minerals (PRESERVISION AREDS) CAPS Take by mouth daily.   Past Medical History:  Diagnosis Date  . Adenomatous colon polyp 2014  . Breast cancer ([place]) 2011   DCIF  . Osteopenia   . Personal history of radiation therapy   . Varicose veins    Past Surgical History:  Procedure Laterality Date  . ABDOMINAL HYSTERECTOMY  1985  . APPENDECTOMY  1956   pt states appendix was on left side not rt. per surgeon  . BREAST LUMPECTOMY  2011   left, reexcision 1/12  . COLONOSCOPY W/ POLYPECTOMY  2014  . ENDOVENOUS ABLATION SAPHENOUS VEIN W/ LASER Left 01-27-2013   left greater saphenous vein and sclerotherapy left leg by Curt Jews MD  . ENDOVENOUS ABLATION SAPHENOUS VEIN W/ LASER Right 02-10-2013   right greater saphenous vein and sclerotherapy right leg by Curt Jews MD  . ROTATOR CUFF REPAIR Right 2009  . SHOULDER SURGERY  2003   tear  . stab phlebectomy Left 07-21-2013   10-20 incisions left leg by Curt Jews MD  . [place]  2009   Social History   Social History Narrative   Patient is married she is retired and has 2 daughters   Some alcohol use no tobacco or drug use   family history includes Cancer in her mother; Diabetes in her father; Heart disease in her father; Hypertension in her mother; Other in her mother.   Review of Systems Active GI problems now other than that mentioned  in the HPI  Objective:   Physical Exam BP (!) 152/74   Pulse 68   Ht _0  (1.651 m)   Wt 124 lb 8 oz (56.5 kg)   LMP 04/01/1983   BMI 20.72 kg/m  No acute distress

## 2018-01-22 NOTE — Patient Instructions (Signed)
  It has been recommended to you by your physician that you have a(n) colonoscopy completed. Per your request, we did not schedule the procedure(s) today. Please contact our office at [phone] should you decide to have the procedure completed.    I appreciate the opportunity to care for you. Carl Gessner, MD, FACG 

## 2018-02-11 ENCOUNTER — Ambulatory Visit (AMBULATORY_SURGERY_CENTER): Payer: Self-pay | Admitting: *Deleted

## 2018-02-11 ENCOUNTER — Other Ambulatory Visit: Payer: Self-pay

## 2018-02-11 VITALS — Ht 65.0 in | Wt 124.0 lb

## 2018-02-11 DIAGNOSIS — Z86010 Personal history of colonic polyps: Secondary | ICD-10-CM

## 2018-02-11 NOTE — Progress Notes (Signed)
No egg or soy allergy known to patient  No issues with past sedation with any surgeries  or procedures, no intubation problems  No diet pills per patient No home 02 use per patient  No blood thinners per patient  Pt denies issues with constipation  No A fib or A flutter  EMMI video declined by the patient.

## 2018-03-01 DIAGNOSIS — H353131 Nonexudative age-related macular degeneration, bilateral, early dry stage: Secondary | ICD-10-CM | POA: Diagnosis not present

## 2018-03-01 DIAGNOSIS — H01005 Unspecified blepharitis left lower eyelid: Secondary | ICD-10-CM | POA: Diagnosis not present

## 2018-03-01 DIAGNOSIS — H01001 Unspecified blepharitis right upper eyelid: Secondary | ICD-10-CM | POA: Diagnosis not present

## 2018-03-01 DIAGNOSIS — H01002 Unspecified blepharitis right lower eyelid: Secondary | ICD-10-CM | POA: Diagnosis not present

## 2018-03-01 DIAGNOSIS — H01004 Unspecified blepharitis left upper eyelid: Secondary | ICD-10-CM | POA: Diagnosis not present

## 2018-03-04 ENCOUNTER — Other Ambulatory Visit: Payer: Self-pay | Admitting: Obstetrics & Gynecology

## 2018-03-04 ENCOUNTER — Ambulatory Visit (AMBULATORY_SURGERY_CENTER): Payer: Medicare Other | Admitting: Internal Medicine

## 2018-03-04 ENCOUNTER — Encounter: Payer: Self-pay | Admitting: Internal Medicine

## 2018-03-04 VITALS — BP 151/55 | HR 58 | Temp 97.1°F | Resp 14 | Ht 65.0 in | Wt 124.0 lb

## 2018-03-04 DIAGNOSIS — D122 Benign neoplasm of ascending colon: Secondary | ICD-10-CM

## 2018-03-04 DIAGNOSIS — I10 Essential (primary) hypertension: Secondary | ICD-10-CM | POA: Diagnosis not present

## 2018-03-04 DIAGNOSIS — Z86010 Personal history of colonic polyps: Secondary | ICD-10-CM

## 2018-03-04 DIAGNOSIS — K635 Polyp of colon: Secondary | ICD-10-CM

## 2018-03-04 DIAGNOSIS — Z1231 Encounter for screening mammogram for malignant neoplasm of breast: Secondary | ICD-10-CM

## 2018-03-04 DIAGNOSIS — D125 Benign neoplasm of sigmoid colon: Secondary | ICD-10-CM

## 2018-03-04 MED ORDER — SODIUM CHLORIDE 0.9 % IV SOLN: 500.0000 mL | Freq: Once | INTRAVENOUS | Status: DC

## 2018-03-04 NOTE — Op Note (Signed)
[facility] Patient Name: Caroline Olson Procedure Date: 03/04/2018 2:47 PM MRN: [id number] Endoscopist: Gatha Mayer , MD Age: 77 Referring MD:  Date of Birth: [birth year] Gender: Female Account #: [id number] Procedure:                Colonoscopy Indications:              Surveillance: Personal history of adenomatous                            polyps on last colonoscopy 5 years ago Medicines:                Propofol per Anesthesia, Monitored Anesthesia Care Procedure:                Pre-Anesthesia Assessment:                           - Prior to the procedure, a History and Physical                            was performed, and patient medications and                            allergies were reviewed. The patient's tolerance of                            previous anesthesia was also reviewed. The risks                            and benefits of the procedure and the sedation                            options and risks were discussed with the patient.                            All questions were answered, and informed consent                            was obtained. Prior Anticoagulants: The patient has                            taken no previous anticoagulant or antiplatelet                            agents. ASA Grade Assessment: II - A patient with                            mild systemic disease. After reviewing the risks                            and benefits, the patient was deemed in                            satisfactory condition to undergo the procedure.  After obtaining informed consent, the colonoscope                            was passed under direct vision. Throughout the                            procedure, the patient's blood pressure, pulse, and                            oxygen saturations were monitored continuously. The                            Colonoscope was introduced through the anus and   advanced to the the cecum, identified by                            appendiceal orifice and ileocecal valve. The                            colonoscopy was somewhat difficult due to                            significant looping. Successful completion of the                            procedure was aided by applying abdominal pressure.                            The patient tolerated the procedure well. The                            quality of the bowel preparation was excellent. The                            ileocecal valve, appendiceal orifice, and rectum                            were photographed. Scope In: 3:00:22 PM Scope Out: 3:20:05 PM Scope Withdrawal Time: 0 hours 14 minutes 2 seconds  Total Procedure Duration: 0 hours 19 minutes 43 seconds  Findings:                 The perianal and digital rectal examinations were                            normal.                           Three sessile polyps were found in the sigmoid                            colon and ascending colon. The polyps were                            diminutive in size. These polyps were removed with  a cold biopsy forceps. Resection and retrieval were                            complete. Verification of patient identification                            for the specimen was done. Estimated blood loss was                            minimal.                           The exam was otherwise without abnormality on                            direct and retroflexion views. Complications:            No immediate complications. Estimated Blood Loss:     Estimated blood loss was minimal. Impression:               - Three diminutive polyps in the sigmoid colon and                            in the ascending colon, removed with a cold biopsy                            forceps. Resected and retrieved.                           - The examination was otherwise normal on direct                             and retroflexion views. Recommendation:           - Patient has a contact number available for                            emergencies. The signs and symptoms of potential                            delayed complications were discussed with the                            patient. Return to normal activities tomorrow.                            Written discharge instructions were provided to the                            patient.                           - Resume previous diet.                           - Continue present medications.                           -  No repeat colonoscopy due to age. Gatha Mayer, MD 03/04/2018 3:27:14 PM This report has been signed electronically.

## 2018-03-04 NOTE — Progress Notes (Signed)
Called to room to assist during endoscopic procedure.  Patient ID and intended procedure confirmed with present staff. Received instructions for my participation in the procedure from the performing physician.  

## 2018-03-04 NOTE — Progress Notes (Signed)
Report given to PACU, vss 

## 2018-03-04 NOTE — Patient Instructions (Addendum)
   I found and removed 3 tiny polyps.  I will let you know the pathology results - you will not need any more routine colonoscopy.  I appreciate the opportunity to care for you. Gatha Mayer, MD, FACG  YOU HAD AN ENDOSCOPIC PROCEDURE TODAY AT [facility]:   Refer to the procedure report that was given to you for any specific questions about what was found during the examination.  If the procedure report does not answer your questions, please call your gastroenterologist to clarify.  If you requested that your care partner not be given the details of your procedure findings, then the procedure report has been included in a sealed envelope for you to review at your convenience later.  YOU SHOULD EXPECT: Some feelings of bloating in the abdomen. Passage of more gas than usual.  Walking can help get rid of the air that was put into your GI tract during the procedure and reduce the bloating. If you had a lower endoscopy (such as a colonoscopy or flexible sigmoidoscopy) you may notice spotting of blood in your stool or on the toilet paper. If you underwent a bowel prep for your procedure, you may not have a normal bowel movement for a few days.  Please Note:  You might notice some irritation and congestion in your nose or some drainage.  This is from the oxygen used during your procedure.  There is no need for concern and it should clear up in a day or so.  SYMPTOMS TO REPORT IMMEDIATELY:   Following lower endoscopy (colonoscopy or flexible sigmoidoscopy):  Excessive amounts of blood in the stool  Significant tenderness or worsening of abdominal pains  Swelling of the abdomen that is new, acute  Fever of 100F or higher   For urgent or emergent issues, a gastroenterologist can be reached at any hour by calling [phone].   DIET:  We do recommend a small meal at first, but then you may proceed to your regular diet.  Drink plenty of fluids but you should avoid  alcoholic beverages for 24 hours.  ACTIVITY:  You should plan to take it easy for the rest of today and you should NOT DRIVE or use heavy machinery until tomorrow (because of the sedation medicines used during the test).    FOLLOW UP: Our staff will call the number listed on your records the next business day following your procedure to check on you and address any questions or concerns that you may have regarding the information given to you following your procedure. If we do not reach you, we will leave a message.  However, if you are feeling well and you are not experiencing any problems, there is no need to return our call.  We will assume that you have returned to your regular daily activities without incident.  If any biopsies were taken you will be contacted by phone or by letter within the next 1-3 weeks.  Please call us at [phone] if you have not heard about the biopsies in 3 weeks.    SIGNATURES/CONFIDENTIALITY: You and/or your care partner have signed paperwork which will be entered into your electronic medical record.  These signatures attest to the fact that that the information above on your After Visit Summary has been reviewed and is understood.  Full responsibility of the confidentiality of this discharge information lies with you and/or your care-partner.

## 2018-03-05 ENCOUNTER — Telehealth: Payer: Self-pay | Admitting: *Deleted

## 2018-03-05 NOTE — Telephone Encounter (Signed)
  Follow up Call-  Call back number 03/04/2018  Post procedure Call Back phone  # [phone]  Permission to leave phone message Yes  Some recent data might be hidden     Patient questions:  Do you have a fever, pain , or abdominal swelling? No. Pain Score  0 *  Have you tolerated food without any problems? Yes.    Have you been able to return to your normal activities? Yes.    Do you have any questions about your discharge instructions: Diet   No. Medications  No. Follow up visit  No.  Do you have questions or concerns about your Care? No.  Actions: * If pain score is 4 or above: No action needed, pain <4.

## 2018-03-08 ENCOUNTER — Ambulatory Visit (INDEPENDENT_AMBULATORY_CARE_PROVIDER_SITE_OTHER): Payer: Medicare Other | Admitting: Internal Medicine

## 2018-03-08 ENCOUNTER — Encounter: Payer: Self-pay | Admitting: Internal Medicine

## 2018-03-08 ENCOUNTER — Other Ambulatory Visit (INDEPENDENT_AMBULATORY_CARE_PROVIDER_SITE_OTHER): Payer: Medicare Other

## 2018-03-08 VITALS — BP 148/74 | HR 61 | Temp 98.4°F | Ht 65.0 in | Wt 125.0 lb

## 2018-03-08 DIAGNOSIS — R7309 Other abnormal glucose: Secondary | ICD-10-CM

## 2018-03-08 DIAGNOSIS — R7989 Other specified abnormal findings of blood chemistry: Secondary | ICD-10-CM

## 2018-03-08 DIAGNOSIS — I10 Essential (primary) hypertension: Secondary | ICD-10-CM | POA: Diagnosis not present

## 2018-03-08 DIAGNOSIS — E785 Hyperlipidemia, unspecified: Secondary | ICD-10-CM

## 2018-03-08 DIAGNOSIS — D519 Vitamin B12 deficiency anemia, unspecified: Secondary | ICD-10-CM | POA: Diagnosis not present

## 2018-03-08 DIAGNOSIS — M170 Bilateral primary osteoarthritis of knee: Secondary | ICD-10-CM | POA: Diagnosis not present

## 2018-03-08 LAB — BASIC METABOLIC PANEL
BUN: 21 mg/dL (ref 6–23)
CO2: 28 mEq/L (ref 19–32)
Calcium: 9.8 mg/dL (ref 8.4–10.5)
Chloride: 103 mEq/L (ref 96–112)
Creatinine, Ser: 0.91 mg/dL (ref 0.40–1.20)
GFR: 63.34 mL/min (ref >60.00)
Glucose, Bld: 104 mg/dL — ABNORMAL HIGH (ref 70–99)
Potassium: 4.1 mEq/L (ref 3.5–5.1)
Sodium: 138 mEq/L (ref 135–145)

## 2018-03-08 LAB — CBC WITH DIFFERENTIAL/PLATELET
BASOS ABS: 0 10*3/uL (ref 0.0–0.1)
Basophils Relative: 0.5 % (ref 0.0–3.0)
Eosinophils Absolute: 0.1 10*3/uL (ref 0.0–0.7)
Eosinophils Relative: 1.4 % (ref 0.0–5.0)
HCT: 33.3 % — ABNORMAL LOW (ref 36.0–46.0)
Hemoglobin: 11.4 g/dL — ABNORMAL LOW (ref 12.0–15.0)
Lymphocytes Relative: 17 % (ref 12.0–46.0)
Lymphs Abs: 1 10*3/uL (ref 0.7–4.0)
MCHC: 34.3 g/dL (ref 30.0–36.0)
MCV: 90.6 fl (ref 78.0–100.0)
Monocytes Absolute: 0.3 10*3/uL (ref 0.1–1.0)
Monocytes Relative: 6.1 % (ref 3.0–12.0)
Neutro Abs: 4.2 10*3/uL (ref 1.4–7.7)
Neutrophils Relative %: 75 % (ref 43.0–77.0)
Platelets: 257 10*3/uL (ref 150.0–400.0)
RBC: 3.67 Mil/uL — ABNORMAL LOW (ref 3.87–5.11)
RDW: 12.8 % (ref 11.5–15.5)
WBC: 5.6 10*3/uL (ref 4.0–10.5)

## 2018-03-08 LAB — HEMOGLOBIN A1C: Hgb A1c MFr Bld: 5.6 % (ref 4.6–6.5)

## 2018-03-08 LAB — LIPID PANEL
Cholesterol: 197 mg/dL (ref 0–200)
HDL: 91.4 mg/dL (ref >39.00)
LDL Cholesterol: 97 mg/dL (ref 0–99)
NonHDL: 105.83
Total CHOL/HDL Ratio: 2
Triglycerides: 43 mg/dL (ref 0.0–149.0)
VLDL: 8.6 mg/dL (ref 0.0–40.0)

## 2018-03-08 LAB — HEPATIC FUNCTION PANEL
ALBUMIN: 4.3 g/dL (ref 3.5–5.2)
ALK PHOS: 63 U/L (ref 39–117)
ALT: 10 U/L (ref 0–35)
AST: 15 U/L (ref 0–37)
BILIRUBIN DIRECT: 0.1 mg/dL (ref 0.0–0.3)
TOTAL PROTEIN: 7 g/dL (ref 6.0–8.3)
Total Bilirubin: 0.6 mg/dL (ref 0.2–1.2)

## 2018-03-08 NOTE — Assessment & Plan Note (Signed)
Doing well 

## 2018-03-08 NOTE — Assessment & Plan Note (Addendum)
Labs offered cardiac CT scan for calcium scoring test

## 2018-03-08 NOTE — Patient Instructions (Addendum)
Cardiac CT calcium scoring test $150   Computed tomography, more commonly known as a CT or CAT scan, is a diagnostic medical imaging test. Like traditional x-rays, it produces multiple images or pictures of the inside of the body. The cross-sectional images generated during a CT scan can be reformatted in multiple planes. They can even generate three-dimensional images. These images can be viewed on a computer monitor, printed on film or by a 3D printer, or transferred to a CD or DVD. CT images of internal organs, bones, soft tissue and blood vessels provide greater detail than traditional x-rays, particularly of soft tissues and blood vessels. A cardiac CT scan for coronary calcium is a non-invasive way of obtaining information about the presence, location and extent of calcified plaque in the coronary arteries-the vessels that supply oxygen-containing blood to the heart muscle. Calcified plaque results when there is a build-up of fat and other substances under the inner layer of the artery. This material can calcify which signals the presence of atherosclerosis, a disease of the vessel wall, also called coronary artery disease (CAD). People with this disease have an increased risk for heart attacks. In addition, over time, progression of plaque build up (CAD) can narrow the arteries or even close off blood flow to the heart. The result may be chest pain, sometimes called "angina," or a heart attack. Because calcium is a marker of CAD, the amount of calcium detected on a cardiac CT scan is a helpful prognostic tool. The findings on cardiac CT are expressed as a calcium score. Another name for this test is coronary artery calcium scoring.  What are some common uses of the procedure? The goal of cardiac CT scan for calcium scoring is to determine if CAD is present and to what extent, even if there are no symptoms. It is a screening study that may be recommended by a physician for patients with risk factors  for CAD but no clinical symptoms. The major risk factors for CAD are: . high blood cholesterol levels  . family history of heart attacks  . diabetes  . high blood pressure  . cigarette smoking  . overweight or obese  . physical inactivity   A negative cardiac CT scan for calcium scoring shows no calcification within the coronary arteries. This suggests that CAD is absent or so minimal it cannot be seen by this technique. The chance of having a heart attack over the next two to five years is very low under these circumstances. A positive test means that CAD is present, regardless of whether or not the patient is experiencing any symptoms. The amount of calcification-expressed as the calcium score-may help to predict the likelihood of a myocardial infarction (heart attack) in the coming years and helps your medical doctor or cardiologist decide whether the patient may need to take preventive medicine or undertake other measures such as diet and exercise to lower the risk for heart attack. The extent of CAD is graded according to your calcium score:  Calcium Score  Presence of CAD  0 No evidence of CAD   1-10 Minimal evidence of CAD  11-100 Mild evidence of CAD  101-400 Moderate evidence of CAD  Over 400 Extensive evidence of CAD   [facility] w/Jill

## 2018-03-08 NOTE — Progress Notes (Signed)
Subjective:  Patient ID: Caroline Olson, female    DOB: 1940-12-29  Age: 77 y.o. MRN: [id number]  CC: No chief complaint on file.   HPI Caroline Olson presents for HTN, allergies, B12 def  Outpatient Medications Prior to Visit  Medication Sig Dispense Refill  . Cholecalciferol (VITAMIN D) 2000 units tablet Take 2,000 Units by mouth daily.    . cyanocobalamin 1000 MCG tablet Take 1,000 mcg by mouth daily.    . irbesartan (AVAPRO) 150 MG tablet Take 1 tablet (150 mg total) by mouth daily. 90 tablet 3  . metronidazole (NORITATE) 1 % cream Apply topically 2 (two) times daily.     . Multiple Vitamins-Minerals (MULTIVITAMIN WITH MINERALS) tablet Take 1 tablet by mouth 3 (three) times a week.     . NON FORMULARY [place] Retina po qd    . Omega-3 Fatty Acids (FISH OIL CONCENTRATE PO) Take by mouth. 1 tsp daily    . fexofenadine (ALLEGRA) 60 MG tablet Take 60 mg by mouth 2 (two) times daily.     No facility-administered medications prior to visit.     ROS: Review of Systems  Constitutional: Negative for activity change, appetite change, chills, fatigue and unexpected weight change.  HENT: Negative for congestion, mouth sores and sinus pressure.   Eyes: Negative for visual disturbance.  Respiratory: Negative for cough and chest tightness.   Gastrointestinal: Negative for abdominal pain and nausea.  Genitourinary: Negative for difficulty urinating, frequency and vaginal pain.  Musculoskeletal: Negative for back pain and gait problem.  Skin: Negative for pallor and rash.  Neurological: Negative for dizziness, tremors, weakness, numbness and headaches.  Psychiatric/Behavioral: Negative for confusion, sleep disturbance and suicidal ideas.    Objective:  BP (!) 148/74 (BP Location: Left Arm, Patient Position: Sitting, Cuff Size: Normal)   Pulse 61   Temp 98.4 F (36.9 C) (Oral)   Ht 5\' 5"  (1.651 m)   Wt 125 lb (56.7 kg)   LMP 04/01/1983   SpO2 99%   BMI 20.80 kg/m   BP Readings from  Last 3 Encounters:  03/08/18 (!) 148/74  03/04/18 (!) 151/55  01/22/18 (!) 152/74    Wt Readings from Last 3 Encounters:  03/08/18 125 lb (56.7 kg)  03/04/18 124 lb (56.2 kg)  02/11/18 124 lb (56.2 kg)    Physical Exam  Constitutional: She appears well-developed. No distress.  HENT:  Head: Normocephalic.  Right Ear: External ear normal.  Left Ear: External ear normal.  Nose: Nose normal.  Mouth/Throat: Oropharynx is clear and moist.  Eyes: Pupils are equal, round, and reactive to light. Conjunctivae are normal. Right eye exhibits no discharge. Left eye exhibits no discharge.  Neck: Normal range of motion. Neck supple. No JVD present. No tracheal deviation present. No thyromegaly present.  Cardiovascular: Normal rate, regular rhythm and normal heart sounds.  Pulmonary/Chest: No stridor. No respiratory distress. She has no wheezes.  Abdominal: Soft. Bowel sounds are normal. She exhibits no distension and no mass. There is no tenderness. There is no rebound and no guarding.  Musculoskeletal: She exhibits no edema or tenderness.  Lymphadenopathy:    She has no cervical adenopathy.  Neurological: She displays normal reflexes. No cranial nerve deficit. She exhibits normal muscle tone. Coordination normal.  Skin: No rash noted. No erythema.  Psychiatric: She has a normal mood and affect. Her behavior is normal. Judgment and thought content normal.    Lab Results  Component Value Date   WBC 4.4 07/17/2017   HGB  10.6 (L) 07/17/2017   HCT 31.5 (L) 07/17/2017   PLT 229 07/17/2017   GLUCOSE 106 (H) 09/09/2017   CHOL 209 (H) 06/01/2017   TRIG 57.0 06/01/2017   HDL 83.10 06/01/2017   LDLDIRECT 104.3 06/07/2008   LDLCALC 114 (H) 06/01/2017   ALT 15 07/17/2017   AST 15 07/17/2017   NA 139 09/09/2017   K 4.6 09/09/2017   CL 103 09/09/2017   CREATININE 1.02 09/09/2017   BUN 27 (H) 09/09/2017   CO2 29 09/09/2017   TSH 0.454 04/03/2017    US Renal  Result Date:  05/18/2017 CLINICAL DATA:  Hematuria with elevated serum creatinine EXAM: RENAL / URINARY TRACT ULTRASOUND COMPLETE COMPARISON:  None. FINDINGS: Right Kidney: Length: 9.6 cm. Echogenicity and renal cortical thickness are within normal limits. No mass, perinephric fluid, or hydronephrosis visualized. No sonographically demonstrable calculus or ureterectasis. Left Kidney: Length: 9.7 cm. Echogenicity and renal cortical thickness are within normal limits. No mass, perinephric fluid, or hydronephrosis visualized. No sonographically demonstrable calculus or ureterectasis. Bladder: Appears normal for degree of bladder distention. IMPRESSION: Study within normal limits. Electronically Signed   By: Lowella Grip III M.D.   On: 05/18/2017 09:50    Assessment & Plan:   There are no diagnoses linked to this encounter.   No orders of the defined types were placed in this encounter.    Follow-up: No follow-ups on file.  Walker Kehr, MD

## 2018-03-08 NOTE — Assessment & Plan Note (Signed)
Vit B12 

## 2018-03-08 NOTE — Assessment & Plan Note (Addendum)
Avapro offered cardiac CT scan for calcium scoring test

## 2018-03-08 NOTE — Assessment & Plan Note (Signed)
Labs

## 2018-03-13 ENCOUNTER — Encounter: Payer: Self-pay | Admitting: Internal Medicine

## 2018-03-13 NOTE — Progress Notes (Signed)
3 adenomas No recall at 77 My Chart

## 2018-03-16 ENCOUNTER — Ambulatory Visit: Payer: Medicare Other | Admitting: Obstetrics & Gynecology

## 2018-04-08 DIAGNOSIS — L570 Actinic keratosis: Secondary | ICD-10-CM | POA: Diagnosis not present

## 2018-04-08 DIAGNOSIS — I788 Other diseases of capillaries: Secondary | ICD-10-CM | POA: Diagnosis not present

## 2018-04-08 DIAGNOSIS — D2272 Melanocytic nevi of left lower limb, including hip: Secondary | ICD-10-CM | POA: Diagnosis not present

## 2018-04-08 DIAGNOSIS — L821 Other seborrheic keratosis: Secondary | ICD-10-CM | POA: Diagnosis not present

## 2018-04-08 DIAGNOSIS — D2271 Melanocytic nevi of right lower limb, including hip: Secondary | ICD-10-CM | POA: Diagnosis not present

## 2018-04-08 DIAGNOSIS — L720 Epidermal cyst: Secondary | ICD-10-CM | POA: Diagnosis not present

## 2018-04-08 DIAGNOSIS — D2239 Melanocytic nevi of other parts of face: Secondary | ICD-10-CM | POA: Diagnosis not present

## 2018-04-08 DIAGNOSIS — B351 Tinea unguium: Secondary | ICD-10-CM | POA: Diagnosis not present

## 2018-04-08 DIAGNOSIS — D1801 Hemangioma of skin and subcutaneous tissue: Secondary | ICD-10-CM | POA: Diagnosis not present

## 2018-04-08 DIAGNOSIS — D220 Melanocytic nevi of lip: Secondary | ICD-10-CM | POA: Diagnosis not present

## 2018-04-09 ENCOUNTER — Ambulatory Visit
Admission: RE | Admit: 2018-04-09 | Discharge: 2018-04-09 | Disposition: A | Discharge status: Home / Self Care | Payer: Medicare Other | Source: Ambulatory Visit | Attending: Obstetrics & Gynecology | Admitting: Obstetrics & Gynecology

## 2018-04-09 DIAGNOSIS — Z1231 Encounter for screening mammogram for malignant neoplasm of breast: Secondary | ICD-10-CM

## 2018-05-04 ENCOUNTER — Ambulatory Visit (INDEPENDENT_AMBULATORY_CARE_PROVIDER_SITE_OTHER): Payer: Medicare Other | Admitting: Obstetrics & Gynecology

## 2018-05-04 ENCOUNTER — Other Ambulatory Visit: Payer: Self-pay

## 2018-05-04 ENCOUNTER — Encounter: Payer: Self-pay | Admitting: Obstetrics & Gynecology

## 2018-05-04 VITALS — BP 140/70 | HR 68 | Resp 14 | Ht 64.0 in | Wt 128.6 lb

## 2018-05-04 DIAGNOSIS — Z01419 Encounter for gynecological examination (general) (routine) without abnormal findings: Secondary | ICD-10-CM | POA: Diagnosis not present

## 2018-05-04 DIAGNOSIS — Z124 Encounter for screening for malignant neoplasm of cervix: Secondary | ICD-10-CM

## 2018-05-04 DIAGNOSIS — M8580 Other specified disorders of bone density and structure, unspecified site: Secondary | ICD-10-CM | POA: Diagnosis not present

## 2018-05-04 DIAGNOSIS — E2839 Other primary ovarian failure: Secondary | ICD-10-CM | POA: Diagnosis not present

## 2018-05-04 NOTE — Progress Notes (Signed)
78 y.o. G2P2 Married White or Caucasian female here for annual exam.  Doing well.  Had colonoscopy in December.  Had 3 adenomatous polyps.  She does have known hemorrhoids.  Has not had any additional rectal bleeding.  Hb 12.09/19 was 11.4.  Denies vaginal bleeding.    Patient's last menstrual period was 04/01/1983.          Sexually active: Yes.    The current method of family planning is status post hysterectomy.    Exercising: Yes.    Walking and hiking  Smoker:  no  Health Maintenance: Pap:  2012 negative History of abnormal Pap:  no MMG:  04/09/18 BIRADS 2 benign/density c Colonoscopy:  12/52019, 3 adenomas BMD:   03/06/14 osteopenia  TDaP:  06/30/13 Pneumonia vaccine(s):  07/08/09, 01/25/14  Zostavax:   06/07/08, 11/11/16 (Shingrix) Hep C testing: not indicated Screening Labs: PCP   reports that she has never smoked. She has never used smokeless tobacco. She reports current alcohol use of about 5.0 standard drinks of alcohol per week. She reports that she does not use drugs.  Past Medical History:  Diagnosis Date  . Adenomatous colon polyp 2014  . Allergy   . Anemia   . Arthritis   . Breast cancer ([place]) 2011   DCIF  . Hypertension   . Osteopenia   . Personal history of radiation therapy   . Varicose veins     Past Surgical History:  Procedure Laterality Date  . ABDOMINAL HYSTERECTOMY  1985  . APPENDECTOMY  1956   pt states appendix was on left side not rt. per surgeon  . BREAST LUMPECTOMY Left 2011   left, reexcision 1/12  . CATARACT EXTRACTION, BILATERAL    . COLONOSCOPY    . COLONOSCOPY W/ POLYPECTOMY  2014  . ENDOVENOUS ABLATION SAPHENOUS VEIN W/ LASER Left 01-27-2013   left greater saphenous vein and sclerotherapy left leg by Curt Jews MD  . ENDOVENOUS ABLATION SAPHENOUS VEIN W/ LASER Right 02-10-2013   right greater saphenous vein and sclerotherapy right leg by Curt Jews MD  . POLYPECTOMY    . ROTATOR CUFF REPAIR Right 2009  . SHOULDER SURGERY  2003   tear   . stab phlebectomy Left 07-21-2013   10-20 incisions left leg by Curt Jews MD  . TRICEPS TENDON REPAIR  2009    Current Outpatient Medications  Medication Sig Dispense Refill  . Cholecalciferol (VITAMIN D) 2000 units tablet Take 2,000 Units by mouth daily.    . cyanocobalamin 1000 MCG tablet Take 1,000 mcg by mouth daily.    . irbesartan (AVAPRO) 150 MG tablet Take 1 tablet (150 mg total) by mouth daily. 90 tablet 3  . metronidazole (NORITATE) 1 % cream Apply topically 2 (two) times daily.     . Multiple Vitamins-Minerals (MULTIVITAMIN WITH MINERALS) tablet Take 1 tablet by mouth 3 (three) times a week.     . NON FORMULARY [place] Retina po qd    . Omega-3 Fatty Acids (FISH OIL CONCENTRATE PO) Take by mouth. 1 tsp daily     No current facility-administered medications for this visit.     Family History  Problem Relation Age of Onset  . Cancer Mother   . Hypertension Mother   . Other Mother        varicose veins  . Heart disease Father   . Diabetes Father   . Colon cancer Neg Hx   . Colon polyps Neg Hx   . Esophageal cancer Neg Hx   .  Rectal cancer Neg Hx   . Stomach cancer Neg Hx     Review of Systems  All other systems reviewed and are negative.   Exam:   BP 140/70   Pulse 68   Resp 14   Ht 5\' 4"  (1.626 m)   Wt 128 lb 9.6 oz (58.3 kg)   LMP 04/01/1983   BMI 22.07 kg/m    Height: 5\' 4"  (162.6 cm)  Ht Readings from Last 3 Encounters:  05/04/18 5\' 4"  (1.626 m)  03/08/18 5\' 5"  (1.651 m)  03/04/18 5\' 5"  (1.651 m)    General appearance: alert, cooperative and appears stated age Head: Normocephalic, without obvious abnormality, atraumatic Neck: no adenopathy, supple, symmetrical, trachea midline and thyroid normal to inspection and palpation Lungs: clear to auscultation bilaterally Breasts: normal appearance, no masses or tenderness Heart: regular rate and rhythm Abdomen: soft, non-tender; bowel sounds normal; no masses,  no organomegaly Extremities: extremities  normal, atraumatic, no cyanosis or edema Skin: Skin color, texture, turgor normal. No rashes or lesions Lymph nodes: Cervical, supraclavicular, and axillary nodes normal. No abnormal inguinal nodes palpated Neurologic: Grossly normal   Pelvic: External genitalia:  no lesions              Urethra:  normal appearing urethra with no masses, tenderness or lesions              Bartholins and Skenes: normal                 Vagina: normal appearing vagina with normal color and discharge, no lesions              Cervix: absent              Pap taken: No. Bimanual Exam:  Uterus:  uterus absent              Adnexa: no mass, fullness, tenderness               Rectovaginal: Confirms               Anus:  normal sphincter tone, no lesions  Chaperone was present for exam. + A:  Well Woman with normal exam PMP, no HRT H/O DCIS 12/11 s/p lumpectomy and radiation.  Release by oncology. Osteopenia H/o TAH Mild anemia, colonoscopy last year with 3 adenomatous polyps  P:   Mammogram guidelines reviewed.  Doing yearly MMG. pap smear not indicated Colonoscopy was done 2019 BMD will be done with next MMG.  Order placed.   Lab work UTD.  Vaccines UTD. return annually or prn

## 2018-05-06 ENCOUNTER — Encounter: Payer: Self-pay | Admitting: Obstetrics & Gynecology

## 2018-05-10 ENCOUNTER — Ambulatory Visit: Payer: Medicare Other | Admitting: Obstetrics & Gynecology

## 2018-07-09 ENCOUNTER — Ambulatory Visit (INDEPENDENT_AMBULATORY_CARE_PROVIDER_SITE_OTHER): Payer: Medicare Other | Admitting: Obstetrics and Gynecology

## 2018-07-09 ENCOUNTER — Encounter: Payer: Self-pay | Admitting: Obstetrics and Gynecology

## 2018-07-09 ENCOUNTER — Telehealth: Payer: Self-pay | Admitting: Obstetrics & Gynecology

## 2018-07-09 ENCOUNTER — Other Ambulatory Visit: Payer: Self-pay

## 2018-07-09 VITALS — BP 120/80 | HR 64 | Temp 97.8°F | Resp 16 | Wt 127.0 lb

## 2018-07-09 DIAGNOSIS — N390 Urinary tract infection, site not specified: Secondary | ICD-10-CM | POA: Diagnosis not present

## 2018-07-09 DIAGNOSIS — R319 Hematuria, unspecified: Secondary | ICD-10-CM | POA: Diagnosis not present

## 2018-07-09 LAB — POCT URINALYSIS DIPSTICK
Bilirubin, UA: NEGATIVE
Glucose, UA: NEGATIVE
Ketones, UA: NEGATIVE
Nitrite, UA: NEGATIVE
Protein, UA: NEGATIVE
Urobilinogen, UA: NEGATIVE E.U./dL — AB
pH, UA: 5 (ref 5.0–8.0)

## 2018-07-09 MED ORDER — SULFAMETHOXAZOLE-TRIMETHOPRIM 800-160 MG PO TABS: 1.0000 | ORAL_TABLET | Freq: Two times a day (BID) | ORAL | 0 refills | Status: DC

## 2018-07-09 NOTE — Telephone Encounter (Signed)
ERRONEOUS ENCOUNTER

## 2018-07-09 NOTE — Progress Notes (Signed)
GYNECOLOGY  VISIT   HPI: 78 y.o.   Married  Caucasian  female   G2P2 with Patient's last menstrual period was 04/01/1983.   here for uti.   Frequency of urination but no burning.  Pink tinge of the urine today.  Had a little back discomfort on the right side since doing some yard work.  Bothered her yesterday and one day last week.  Had a similar pain with shingles in the past.  No nausea or vomiting.  No fever.   No new partners.    No vaginal symptoms.  No itching, discharge, or odor.   GYNECOLOGIC HISTORY: Patient's last menstrual period was 04/01/1983. Contraception:  hysterectomy Menopausal hormone therapy:  ---- Last mammogram:  04-09-18 category c birads 2:neg Last pap smear:   2012 neg poct urine-wbc 1+, rbc 2+  OB History    Gravida  2   Para  2   Term      Preterm      AB      Living  2     SAB      TAB      Ectopic      Multiple      Live Births                 Patient Active Problem List   Diagnosis Date Noted  . Creatinine elevation 04/23/2017  . Urinary tract infection 04/23/2017  . Hematuria, microscopic 04/23/2017  . Anemia due to vitamin B12 deficiency 06/15/2015  . Elevated glucose 06/15/2015  . Essential hypertension 02/05/2015  . Smell disturbance 02/05/2015  . Breast cancer of upper-outer quadrant of left female breast ([place]) 01/10/2013  . Chronic venous insufficiency 10/08/2012  . Hiccups 07/08/2012  . Varicose veins of lower extremities with other complications 81/19/1478  . Well adult exam 07/08/2011  . PARESTHESIA 05/08/2009  . Osteoarthritis 06/07/2008  . GOITER, NONTOXIC MULTINODULAR 01/20/2007    Past Medical History:  Diagnosis Date  . Adenomatous colon polyp 2014  . Allergy   . Anemia   . Arthritis   . Breast cancer ([place]) 2011   DCIF  . Hypertension   . Osteopenia   . Personal history of radiation therapy   . Varicose veins     Past Surgical History:  Procedure Laterality Date  . ABDOMINAL  HYSTERECTOMY  1985  . APPENDECTOMY  1956   pt states appendix was on left side not rt. per surgeon  . BREAST LUMPECTOMY Left 2011   left, reexcision 1/12  . CATARACT EXTRACTION, BILATERAL    . COLONOSCOPY    . COLONOSCOPY W/ POLYPECTOMY  2014  . ENDOVENOUS ABLATION SAPHENOUS VEIN W/ LASER Left 01-27-2013   left greater saphenous vein and sclerotherapy left leg by Curt Jews MD  . ENDOVENOUS ABLATION SAPHENOUS VEIN W/ LASER Right 02-10-2013   right greater saphenous vein and sclerotherapy right leg by Curt Jews MD  . POLYPECTOMY    . ROTATOR CUFF REPAIR Right 2009  . SHOULDER SURGERY  2003   tear  . stab phlebectomy Left 07-21-2013   10-20 incisions left leg by Curt Jews MD  . TRICEPS TENDON REPAIR  2009    Current Outpatient Medications  Medication Sig Dispense Refill  . cyanocobalamin 1000 MCG tablet Take 1,000 mcg by mouth daily.    . irbesartan (AVAPRO) 150 MG tablet Take 1 tablet (150 mg total) by mouth daily. 90 tablet 3  . metronidazole (NORITATE) 1 % cream Apply topically 2 (two) times daily.     Marland Kitchen  Multiple Vitamins-Minerals (MULTIVITAMIN WITH MINERALS) tablet Take 1 tablet by mouth 3 (three) times a week.     . NON FORMULARY [place] Retina po qd    . VITAMIN D PO Take 1,000 Int'l Units by mouth.    . sulfamethoxazole-trimethoprim (BACTRIM DS) 800-160 MG tablet Take 1 tablet by mouth 2 (two) times daily. One PO BID x 3 days 6 tablet 0   No current facility-administered medications for this visit.      ALLERGIES: Aspirin and Codeine sulfate  Family History  Problem Relation Age of Onset  . Cancer Mother   . Hypertension Mother   . Other Mother        varicose veins  . Heart disease Father   . Diabetes Father   . Colon cancer Neg Hx   . Colon polyps Neg Hx   . Esophageal cancer Neg Hx   . Rectal cancer Neg Hx   . Stomach cancer Neg Hx     Social History   Socioeconomic History  . Marital status: Married    Spouse name: Not on file  . Number of children: 2  .  Years of education: Not on file  . Highest education level: Not on file  Occupational History  . Not on file  Social Needs  . Financial resource strain: Not hard at all  . Food insecurity:    Worry: Never true    Inability: Never true  . Transportation needs:    Medical: No    Non-medical: No  Tobacco Use  . Smoking status: Never Smoker  . Smokeless tobacco: Never Used  Substance and Sexual Activity  . Alcohol use: Yes    Alcohol/week: 5.0 standard drinks    Types: 5 Glasses of wine per week  . Drug use: No  . Sexual activity: Yes    Partners: Male    Birth control/protection: Surgical    Comment: TAH/BSO  Lifestyle  . Physical activity:    Days per week: 6 days    Minutes per session: 50 min  . Stress: To some extent  Relationships  . Social connections:    Talks on phone: More than three times a week    Gets together: More than three times a week    Attends religious service: More than 4 times per year    Active member of club or organization: Yes    Attends meetings of clubs or organizations: More than 4 times per year    Relationship status: Married  . Intimate partner violence:    Fear of current or ex partner: No    Emotionally abused: No    Physically abused: No    Forced sexual activity: No  Other Topics Concern  . Not on file  Social History Narrative   Patient is married she is retired and has 2 daughters   Some alcohol use no tobacco or drug use    Review of Systems  See above.  Otherwise negative.   PHYSICAL EXAMINATION:    BP 120/80   Pulse 64   Temp 97.8 F (36.6 C) (Oral)   Resp 16   Wt 127 lb (57.6 kg)   LMP 04/01/1983   BMI 21.80 kg/m     General appearance: alert, cooperative and appears stated age  Pelvic: External genitalia:  no lesions              Urethra:  normal appearing urethra with no masses, tenderness or lesions  Bartholins and Skenes: normal                 Vagina: normal appearing vagina with normal color  and discharge, no lesions              Cervix:  absent                Bimanual Exam:  Uterus:   absent              Adnexa: no mass, fullness, tenderness              Chaperone was present for exam.  ASSESSMENT  UTI.   PLAN  Urine micro and culture.  Bactrim DS po bid x 3 days.  Hydrate well.  Ok for AZO standard tid prn x 2 days.  Call if no improvement in 48 hours.    An After Visit Summary was printed and given to the patient.  _15_____ minutes face to face time of which over 50% was spent in counseling.

## 2018-07-09 NOTE — Addendum Note (Signed)
Addended by: Susy Manor on: 07/09/2018 01:22 PM   Modules accepted: Orders

## 2018-07-09 NOTE — Patient Instructions (Signed)
Urinary Tract Infection, Adult A urinary tract infection (UTI) is an infection of any part of the urinary tract. The urinary tract includes:  The kidneys.  The ureters.  The bladder.  The urethra. These organs make, store, and get rid of pee (urine) in the body. What are the causes? This is caused by germs (bacteria) in your genital area. These germs grow and cause swelling (inflammation) of your urinary tract. What increases the risk? You are more likely to develop this condition if:  You have a small, thin tube (catheter) to drain pee.  You cannot control when you pee or poop (incontinence).  You are female, and: ? You use these methods to prevent pregnancy: ? A medicine that kills sperm (spermicide). ? A device that blocks sperm (diaphragm). ? You have low levels of a female hormone (estrogen). ? You are pregnant.  You have genes that add to your risk.  You are sexually active.  You take antibiotic medicines.  You have trouble peeing because of: ? A prostate that is bigger than normal, if you are female. ? A blockage in the part of your body that drains pee from the bladder (urethra). ? A kidney stone. ? A nerve condition that affects your bladder (neurogenic bladder). ? Not getting enough to drink. ? Not peeing often enough.  You have other conditions, such as: ? Diabetes. ? A weak disease-fighting system (immune system). ? Sickle cell disease. ? Gout. ? Injury of the spine. What are the signs or symptoms? Symptoms of this condition include:  Needing to pee right away (urgently).  Peeing often.  Peeing small amounts often.  Pain or burning when peeing.  Blood in the pee.  Pee that smells bad or not like normal.  Trouble peeing.  Pee that is cloudy.  Fluid coming from the vagina, if you are female.  Pain in the belly or lower back. Other symptoms include:  Throwing up (vomiting).  No urge to eat.  Feeling mixed up (confused).  Being tired  and grouchy (irritable).  A fever.  Watery poop (diarrhea). How is this treated? This condition may be treated with:  Antibiotic medicine.  Other medicines.  Drinking enough water. Follow these instructions at home:  Medicines  Take over-the-counter and prescription medicines only as told by your doctor.  If you were prescribed an antibiotic medicine, take it as told by your doctor. Do not stop taking it even if you start to feel better. General instructions  Make sure you: ? Pee until your bladder is empty. ? Do not hold pee for a long time. ? Empty your bladder after sex. ? Wipe from front to back after pooping if you are a female. Use each tissue one time when you wipe.  Drink enough fluid to keep your pee pale yellow.  Keep all follow-up visits as told by your doctor. This is important. Contact a doctor if:  You do not get better after 1-2 days.  Your symptoms go away and then come back. Get help right away if:  You have very bad back pain.  You have very bad pain in your lower belly.  You have a fever.  You are sick to your stomach (nauseous).  You are throwing up. Summary  A urinary tract infection (UTI) is an infection of any part of the urinary tract.  This condition is caused by germs in your genital area.  There are many risk factors for a UTI. These include having a small, thin   tube to drain pee and not being able to control when you pee or poop.  Treatment includes antibiotic medicines for germs.  Drink enough fluid to keep your pee pale yellow. This information is not intended to replace advice given to you by your health care provider. Make sure you discuss any questions you have with your health care provider. Document Released: 09/03/2007 Document Revised: 09/24/2017 Document Reviewed: 09/24/2017 Elsevier Interactive Patient Education  2019 [employer]  

## 2018-07-10 LAB — URINALYSIS, MICROSCOPIC ONLY
Casts: NONE SEEN /lpf
WBC, UA: >30 /hpf — AB (ref 0–5)

## 2018-07-11 LAB — URINE CULTURE

## 2018-07-23 ENCOUNTER — Ambulatory Visit: Payer: Medicare Other | Admitting: Obstetrics & Gynecology

## 2018-07-23 ENCOUNTER — Encounter

## 2018-07-26 ENCOUNTER — Other Ambulatory Visit: Payer: Medicare Other

## 2018-08-30 ENCOUNTER — Other Ambulatory Visit: Payer: Self-pay

## 2018-08-30 ENCOUNTER — Ambulatory Visit
Admission: RE | Admit: 2018-08-30 | Discharge: 2018-08-30 | Disposition: A | Discharge status: Home / Self Care | Payer: Medicare Other | Source: Ambulatory Visit | Attending: Obstetrics & Gynecology | Admitting: Obstetrics & Gynecology

## 2018-08-30 ENCOUNTER — Other Ambulatory Visit: Payer: Medicare Other

## 2018-08-30 DIAGNOSIS — E2839 Other primary ovarian failure: Secondary | ICD-10-CM

## 2018-08-30 DIAGNOSIS — M8580 Other specified disorders of bone density and structure, unspecified site: Secondary | ICD-10-CM

## 2018-08-30 DIAGNOSIS — M8589 Other specified disorders of bone density and structure, multiple sites: Secondary | ICD-10-CM | POA: Diagnosis not present

## 2018-08-30 DIAGNOSIS — Z780 Asymptomatic menopausal state: Secondary | ICD-10-CM | POA: Diagnosis not present

## 2018-08-31 ENCOUNTER — Ambulatory Visit (INDEPENDENT_AMBULATORY_CARE_PROVIDER_SITE_OTHER): Payer: Medicare Other | Admitting: *Deleted

## 2018-08-31 ENCOUNTER — Telehealth: Payer: Self-pay | Admitting: *Deleted

## 2018-08-31 DIAGNOSIS — Z0000 Encounter for general adult medical examination without abnormal findings: Secondary | ICD-10-CM

## 2018-08-31 NOTE — Telephone Encounter (Signed)
Spoke with patient, advised as seen below per Dr. Miller. Patient verbalizes understanding and is agreeable.   Encounter closed.  

## 2018-08-31 NOTE — Telephone Encounter (Signed)
LM for pt to call back.

## 2018-08-31 NOTE — Telephone Encounter (Signed)
Patient is returning a call to Reina. °

## 2018-08-31 NOTE — Telephone Encounter (Signed)
-----   Message from Megan Salon, MD sent at 08/31/2018 10:40 AM EDT ----- Please let pt know her BMD just showed some mild osteopenia. This is great.  I'm not sure she needs to repeat a BMD as they've all been good but it would be ok to do this again in five years.

## 2018-08-31 NOTE — Progress Notes (Addendum)
Subjective:   Caroline Olson is a 78 y.o. female who presents for Medicare Annual (Subsequent) preventive examination. I connected with patient by a telephone and verified that I am speaking with the correct person using two identifiers. Patient stated full name and DOB. Patient gave permission to continue with telephonic visit. Patient's location was at home and Nurse's location was at [place] office.  Review of Systems:  No ROS.  Medicare Wellness Virtual Visit.  Visual/audio telehealth visit, UTA vital signs.   See social history for additional risk factors.    Sleep patterns: feels rested on waking and sleeps 8-9 hours nightly.   Home Safety/Smoke Alarms: Feels safe in home. Smoke alarms in place.  Living environment; residence and Firearm Safety: 1-story house/ trailer. Lives with husband, no needs for DME, good support system Seat Belt Safety/Bike Helmet: Wears seat belt.     Objective:     Vitals: LMP 04/01/1983   There is no height or weight on file to calculate BMI.  Advanced Directives 08/31/2018 08/17/2017 08/04/2016 02/05/2015  Does Patient Have a Medical Advance Directive? Yes Yes Yes Yes  Type of Paramedic of [place];Living will [place];Living will [place];Living will -  Does patient want to make changes to medical advance directive? - - Yes (ED - Information included in AVS) -  Copy of [place] in Chart? No - copy requested No - copy requested Yes Yes    Tobacco Social History   Tobacco Use  Smoking Status Never Smoker  Smokeless Tobacco Never Used     Counseling given: Not Answered  Past Medical History:  Diagnosis Date  . Adenomatous colon polyp 2014  . Allergy   . Anemia   . Arthritis   . Breast cancer ([facility]) 2011   DCIF  . Hypertension   . Osteopenia   . Personal history of radiation therapy   . Varicose veins    Past Surgical History:  Procedure Laterality Date   . ABDOMINAL HYSTERECTOMY  1985  . APPENDECTOMY  1956   pt states appendix was on left side not rt. per surgeon  . BREAST LUMPECTOMY Left 2011   left, reexcision 1/12  . CATARACT EXTRACTION, BILATERAL    . COLONOSCOPY    . COLONOSCOPY W/ POLYPECTOMY  2014  . ENDOVENOUS ABLATION SAPHENOUS VEIN W/ LASER Left 01-27-2013   left greater saphenous vein and sclerotherapy left leg by Curt Jews MD  . ENDOVENOUS ABLATION SAPHENOUS VEIN W/ LASER Right 02-10-2013   right greater saphenous vein and sclerotherapy right leg by Curt Jews MD  . POLYPECTOMY    . ROTATOR CUFF REPAIR Right 2009  . SHOULDER SURGERY  2003   tear  . stab phlebectomy Left 07-21-2013   10-20 incisions left leg by Curt Jews MD  . TRICEPS TENDON REPAIR  2009   Family History  Problem Relation Age of Onset  . Cancer Mother   . Hypertension Mother   . Other Mother        varicose veins  . Heart disease Father   . Diabetes Father   . Colon cancer Neg Hx   . Colon polyps Neg Hx   . Esophageal cancer Neg Hx   . Rectal cancer Neg Hx   . Stomach cancer Neg Hx    Social History   Socioeconomic History  . Marital status: Married    Spouse name: Not on file  . Number of children: 2  .  Years of education: Not on file  . Highest education level: Not on file  Occupational History  . Occupation: retired   Scientific laboratory technician  . Financial resource strain: Not hard at all  . Food insecurity:    Worry: Never true    Inability: Never true  . Transportation needs:    Medical: No    Non-medical: No  Tobacco Use  . Smoking status: Never Smoker  . Smokeless tobacco: Never Used  Substance and Sexual Activity  . Alcohol use: Yes    Alcohol/week: 5.0 standard drinks    Types: 5 Glasses of Michaeline Eckersley per week  . Drug use: No  . Sexual activity: Yes    Partners: Male    Birth control/protection: Surgical    Comment: TAH/BSO  Lifestyle  . Physical activity:    Days per week: 6 days    Minutes per session: 50 min  . Stress: Not at  all  Relationships  . Social connections:    Talks on phone: More than three times a week    Gets together: More than three times a week    Attends religious service: More than 4 times per year    Active member of club or organization: Yes    Attends meetings of clubs or organizations: More than 4 times per year    Relationship status: Married  Other Topics Concern  . Not on file  Social History Narrative   Patient is married she is retired and has 2 daughters   Some alcohol use no tobacco or drug use    Outpatient Encounter Medications as of 08/31/2018  Medication Sig  . cyanocobalamin 1000 MCG tablet Take 1,000 mcg by mouth daily.  . irbesartan (AVAPRO) 150 MG tablet Take 1 tablet (150 mg total) by mouth daily.  . metronidazole (NORITATE) 1 % cream Apply topically 2 (two) times daily.   . Multiple Vitamins-Minerals (MULTIVITAMIN WITH MINERALS) tablet Take 1 tablet by mouth 3 (three) times a week.   . NON FORMULARY [place] Retina po qd  . VITAMIN D PO Take 1,000 Int'l Units by mouth.  . [DISCONTINUED] sulfamethoxazole-trimethoprim (BACTRIM DS) 800-160 MG tablet Take 1 tablet by mouth 2 (two) times daily. One PO BID x 3 days   No facility-administered encounter medications on file as of 08/31/2018.     Activities of Daily Living In your present state of health, do you have any difficulty performing the following activities: 08/31/2018  Hearing? N  Vision? N  Difficulty concentrating or making decisions? N  Walking or climbing stairs? N  Dressing or bathing? N  Doing errands, shopping? N  Preparing Food and eating ? N  Using the Toilet? N  In the past six months, have you accidently leaked urine? N  Do you have problems with loss of bowel control? N  Managing your Medications? N  Managing your Finances? N  Housekeeping or managing your Housekeeping? N  Some recent data might be hidden    Patient Care Team: Plotnikov, Evie Lacks, MD as PCP - General Magrinat, Virgie Dad, MD  (Hematology and Oncology) Coralie Keens, MD (General Surgery) Megan Salon, MD as Attending Physician (Gynecology) Gavin Pound, MD as Consulting Physician (Rheumatology) Almedia Balls, MD as Consulting Physician (Orthopedic Surgery) Ardath Sax, MD (Inactive) as Consulting Physician (Hematology and Oncology)    Assessment:   This is a routine wellness examination for [facility]. Physical assessment deferred to PCP.  Exercise Activities and Dietary recommendations Current Exercise Habits: Home exercise routine, Exercise  limited by: None identified  Diet (meal preparation, eat out, water intake, caffeinated beverages, dairy products, fruits and vegetables): in general, a "healthy" diet  , well balanced eats a variety of fruits and vegetables daily, limits salt, fat/cholesterol, sugar,carbohydrates,caffeine, drinks 6-8 glasses of water daily.  Goals    . exercise      Start to work out with light weights to improve my physical health. Do a little more each day. Improve my upper body strength    . Exercise 150 minutes per week (moderate activity)     Will add low weight bearing exercises 2 times a week     . Patient Stated     Continue to be active, hike weekly, walk daily, eat healthy, travel, enjoy life and family.       Fall Risk Fall Risk  08/31/2018 08/17/2017 08/04/2016 06/20/2016 03/07/2016  Falls in the past year? 0 No Yes Yes No  Comment - - - - Emmi Telephone Survey: data to providers prior to load  Number falls in past yr: 0 - 2 or more 2 or more -  Injury with Fall? - - No No -   Depression Screen PHQ 2/9 Scores 08/31/2018 08/17/2017 08/04/2016 06/20/2016  PHQ - 2 Score 0 0 0 0  PHQ- 9 Score - 0 - -     Cognitive Function MMSE - Mini Mental State Exam 08/17/2017  Orientation to time 5  Orientation to Place 5  Registration 3  Attention/ Calculation 5  Recall 3  Language- name 2 objects 2  Language- repeat 1  Language- follow 3 step command 3  Language- read &  follow direction 1  Write a sentence 1  Copy design 1  Total score 30       Ad8 score reviewed for issues:  Issues making decisions: no  Less interest in hobbies / activities: no  Repeats questions, stories (family complaining): no  Trouble using ordinary gadgets (microwave, computer, phone):no  Forgets the month or year: no  Mismanaging finances: no  Remembering appts: no  Daily problems with thinking and/or memory: no Ad8 score is= 0  Immunization History  Administered Date(s) Administered  . Influenza Split 02/13/2015  . Influenza,inj,Quad PF,6+ Mos 02/09/2016, 01/30/2017, 01/30/2018  . Influenza-Unspecified 01/18/2013, 01/12/2014, 01/30/2016  . Pneumococcal Conjugate-13 01/25/2014  . Pneumococcal Polysaccharide-23 07/08/2009  . Td 05/30/2003  . Tdap 06/30/2013  . Zoster 06/07/2008  . Zoster Recombinat (Shingrix) 11/11/2016, 01/01/2017   Screening Tests Health Maintenance  Topic Date Due  . INFLUENZA VACCINE  10/30/2018  . TETANUS/TDAP  07/01/2023  . DEXA SCAN  Completed  . PNA vac Low Risk Adult  Completed      Plan:    Reviewed health maintenance screenings with patient today and relevant education, vaccines, and/or referrals were provided.   Continue to eat heart healthy diet (full of fruits, vegetables, whole grains, lean protein, water--limit salt, fat, and sugar intake) and increase physical activity as tolerated.  Continue doing brain stimulating activities (puzzles, reading, adult coloring books, staying active) to keep memory sharp.   I have personally reviewed and noted the following in the patient's chart:   . Medical and social history . Use of alcohol, tobacco or illicit drugs  . Current medications and supplements . Functional ability and status . Nutritional status . Physical activity . Advanced directives . List of other physicians . Screenings to include cognitive, depression, and falls . Referrals and appointments  In addition,  I have reviewed and discussed with patient  certain preventive protocols, quality metrics, and best practice recommendations. A written personalized care plan for preventive services as well as general preventive health recommendations were provided to patient.     Michiel Cowboy, RN  08/31/2018   Medical screening examination/treatment/procedure(s) were performed by non-physician practitioner and as supervising physician I was immediately available for consultation/collaboration. I agree with above. Lew Dawes, MD

## 2018-09-07 ENCOUNTER — Other Ambulatory Visit (INDEPENDENT_AMBULATORY_CARE_PROVIDER_SITE_OTHER): Payer: Medicare Other

## 2018-09-07 ENCOUNTER — Encounter: Payer: Self-pay | Admitting: Internal Medicine

## 2018-09-07 ENCOUNTER — Ambulatory Visit (INDEPENDENT_AMBULATORY_CARE_PROVIDER_SITE_OTHER): Payer: Medicare Other | Admitting: Internal Medicine

## 2018-09-07 ENCOUNTER — Other Ambulatory Visit: Payer: Self-pay

## 2018-09-07 VITALS — BP 126/70 | HR 69 | Temp 98.2°F | Ht 64.0 in | Wt 127.0 lb

## 2018-09-07 DIAGNOSIS — C50412 Malignant neoplasm of upper-outer quadrant of left female breast: Secondary | ICD-10-CM

## 2018-09-07 DIAGNOSIS — E559 Vitamin D deficiency, unspecified: Secondary | ICD-10-CM

## 2018-09-07 DIAGNOSIS — D519 Vitamin B12 deficiency anemia, unspecified: Secondary | ICD-10-CM

## 2018-09-07 DIAGNOSIS — R7309 Other abnormal glucose: Secondary | ICD-10-CM

## 2018-09-07 DIAGNOSIS — M8580 Other specified disorders of bone density and structure, unspecified site: Secondary | ICD-10-CM

## 2018-09-07 DIAGNOSIS — R7989 Other specified abnormal findings of blood chemistry: Secondary | ICD-10-CM

## 2018-09-07 DIAGNOSIS — E785 Hyperlipidemia, unspecified: Secondary | ICD-10-CM

## 2018-09-07 LAB — BASIC METABOLIC PANEL
BUN: 22 mg/dL (ref 6–23)
CO2: 30 mEq/L (ref 19–32)
Calcium: 9.8 mg/dL (ref 8.4–10.5)
Chloride: 101 mEq/L (ref 96–112)
Creatinine, Ser: 1.03 mg/dL (ref 0.40–1.20)
GFR: 51.59 mL/min — ABNORMAL LOW (ref >60.00)
Glucose, Bld: 100 mg/dL — ABNORMAL HIGH (ref 70–99)
Potassium: 4.8 mEq/L (ref 3.5–5.1)
Sodium: 137 mEq/L (ref 135–145)

## 2018-09-07 LAB — HEMOGLOBIN A1C: Hgb A1c MFr Bld: 5.8 % (ref 4.6–6.5)

## 2018-09-07 LAB — VITAMIN D 25 HYDROXY (VIT D DEFICIENCY, FRACTURES): VITD: 60.85 ng/mL (ref 30.00–100.00)

## 2018-09-07 LAB — VITAMIN B12: Vitamin B-12: 773 pg/mL (ref 211–911)

## 2018-09-07 LAB — TSH: TSH: 0.91 u[IU]/mL (ref 0.35–4.50)

## 2018-09-07 MED ORDER — IRBESARTAN 150 MG PO TABS: 150.0000 mg | ORAL_TABLET | Freq: Every day | ORAL | 3 refills | Status: DC

## 2018-09-07 NOTE — Assessment & Plan Note (Signed)
Prolia offered

## 2018-09-07 NOTE — Assessment & Plan Note (Signed)
BMET 

## 2018-09-07 NOTE — Patient Instructions (Addendum)
If you have medicare related insurance (such as traditional Medicare, Blue [employer], [employer], or similar), Please make an appointment at the scheduling desk with Sharee Pimple, the [employer], for your Wellness visit in this office, which is a benefit with your insurance.   Your bone density scan shows a mild-to-moderate bone loss called osteopenia. Please make an appointment to see me if you are willing to discuss any additional treatment options. You should be taking Vitamin D3 2000 iu a day and perform weight bearing exercises regular.  WHO criteria for diagnosis of osteoporosis in postmenopausal women and in men 71 y/o or older:  - normal: T-score -1.0 to + 1.0 - osteopenia/low bone density: T-score between -2.5 and -1.0 - osteoporosis: T-score below -2.5 - severe osteoporosis: T-score below -2.5 with history of fragility fracture Note: although not part of the WHO classification, the presence of a fragility fracture, regardless of the T-score, should be considered diagnostic of osteoporosis, provided other causes for the fracture have been excluded.  Treatment: The National Osteoporosis Foundation recommends that treatment be considered in postmenopausal women and men age 55 or older with: 1. Hip or vertebral (clinical or morphometric) fracture 2. T-score of - 2.5 or lower at the spine or hip 3. 10-year fracture probability by FRAX of at least 20% for a major osteoporotic fracture and 3% for a hip fracture

## 2018-09-07 NOTE — Assessment & Plan Note (Signed)
Mammo q 12 mo °

## 2018-09-07 NOTE — Progress Notes (Signed)
Subjective:  Patient ID: Caroline Olson, female    DOB: 1941-03-04  Age: 78 y.o. MRN: [id number]  CC: No chief complaint on file.   HPI Caroline Olson presents for HTN, B12 def, elev creat  Outpatient Medications Prior to Visit  Medication Sig Dispense Refill   cyanocobalamin 1000 MCG tablet Take 1,000 mcg by mouth daily.     irbesartan (AVAPRO) 150 MG tablet Take 1 tablet (150 mg total) by mouth daily. 90 tablet 3   metronidazole (NORITATE) 1 % cream Apply topically 2 (two) times daily.      Multiple Vitamins-Minerals (MULTIVITAMIN WITH MINERALS) tablet Take 1 tablet by mouth 3 (three) times a week.      NON FORMULARY [place] Retina po qd     VITAMIN D PO Take 1,000 Int'l Units by mouth.     No facility-administered medications prior to visit.     ROS: Review of Systems  Constitutional: Negative for activity change, appetite change, chills, fatigue and unexpected weight change.  HENT: Negative for congestion, mouth sores and sinus pressure.   Eyes: Negative for visual disturbance.  Respiratory: Negative for cough and chest tightness.   Gastrointestinal: Negative for abdominal pain and nausea.  Genitourinary: Negative for difficulty urinating, frequency and vaginal pain.  Musculoskeletal: Negative for back pain and gait problem.  Skin: Negative for pallor and rash.  Neurological: Negative for dizziness, tremors, weakness, numbness and headaches.  Psychiatric/Behavioral: Negative for confusion, sleep disturbance and suicidal ideas.    Objective:  BP 126/70 (BP Location: Left Arm, Patient Position: Sitting, Cuff Size: Normal)    Pulse 69    Temp 98.2 F (36.8 C) (Oral)    Ht 5\' 4"  (1.626 m)    Wt 127 lb (57.6 kg)    LMP 04/01/1983    SpO2 97%    BMI 21.80 kg/m   BP Readings from Last 3 Encounters:  09/07/18 126/70  07/09/18 120/80  05/04/18 140/70    Wt Readings from Last 3 Encounters:  09/07/18 127 lb (57.6 kg)  07/09/18 127 lb (57.6 kg)  05/04/18 128 lb 9.6 oz (58.3  kg)    Physical Exam Constitutional:      General: She is not in acute distress.    Appearance: She is well-developed.  HENT:     Head: Normocephalic.     Right Ear: External ear normal.     Left Ear: External ear normal.     Nose: Nose normal.  Eyes:     General:        Right eye: No discharge.        Left eye: No discharge.     Conjunctiva/sclera: Conjunctivae normal.     Pupils: Pupils are equal, round, and reactive to light.  Neck:     Musculoskeletal: Normal range of motion and neck supple.     Thyroid: No thyromegaly.     Vascular: No JVD.     Trachea: No tracheal deviation.  Cardiovascular:     Rate and Rhythm: Normal rate and regular rhythm.     Heart sounds: Normal heart sounds.  Pulmonary:     Effort: No respiratory distress.     Breath sounds: No stridor. No wheezing.  Abdominal:     General: Bowel sounds are normal. There is no distension.     Palpations: Abdomen is soft. There is no mass.     Tenderness: There is no abdominal tenderness. There is no guarding or rebound.  Musculoskeletal:  General: No tenderness.  Lymphadenopathy:     Cervical: No cervical adenopathy.  Skin:    Findings: No erythema or rash.  Neurological:     Cranial Nerves: No cranial nerve deficit.     Motor: No abnormal muscle tone.     Coordination: Coordination normal.     Deep Tendon Reflexes: Reflexes normal.  Psychiatric:        Behavior: Behavior normal.        Thought Content: Thought content normal.        Judgment: Judgment normal.     Lab Results  Component Value Date   WBC 5.6 03/08/2018   HGB 11.4 (L) 03/08/2018   HCT 33.3 (L) 03/08/2018   PLT 257.0 03/08/2018   GLUCOSE 104 (H) 03/08/2018   CHOL 197 03/08/2018   TRIG 43.0 03/08/2018   HDL 91.40 03/08/2018   LDLDIRECT 104.3 06/07/2008   LDLCALC 97 03/08/2018   ALT 10 03/08/2018   AST 15 03/08/2018   NA 138 03/08/2018   K 4.1 03/08/2018   CL 103 03/08/2018   CREATININE 0.91 03/08/2018   BUN 21  03/08/2018   CO2 28 03/08/2018   TSH 0.454 04/03/2017   HGBA1C 5.6 03/08/2018    Dg Bone Density  Result Date: 08/30/2018 EXAM: DUAL X-RAY ABSORPTIOMETRY (DXA) FOR BONE MINERAL DENSITY IMPRESSION: Referring Physician:  Megan Salon Your patient completed a BMD test using Lunar IDXA DXA system ( analysis version: 16 ) manufactured by [employer]. Technologist: AW PATIENT: Name: Caroline, Olson Patient ID:  [id number] Birth Date: 24-Feb-1941 Height:     66.0 in. Sex: Female Measured: 08/30/2018 Weight: 140.0 lbs. Indications: Advanced Age, Breast Cancer History, Caucasian, Estrogen Deficient, osteoarthiritis, Postmenopausal Fractures: None Treatments: Vitamin D (E933.5) ASSESSMENT: The BMD measured at Femur Neck Left is 0.775 g/cm2 with a T-score of -1.9. This patient is considered OSTEOPENIC according to [facility]) criteria. There has been a statistically significant decrease in BMD of Left hip since prior exam dated 02/26/2004. The scan quality is good. L-2 was excluded due to degenerative changes. Spine was not compared to prior study due to exclusion of vertebral bodies on current exam. Prior DXA exam performed on Hologic device measures only unilateral hip (not Total Mean Hip). Therefore, current Total Mean Hip cannot be compared to prior exam. Site Region Measured Date Measured Age YA T-score BMD Significant CHANGE DualFemur Neck Left 08/30/2018 79.7 -1.9 0.775 g/cm2 * DualFemur Neck Left  02/26/2004    65.2         -1.4    0.842 g/cm2 AP Spine  L1-L4 (L2) 08/30/2018    79.7         -1.2    1.027 g/cm2 DualFemur Total Mean 08/30/2018    79.7         -1.2    0.856 g/cm2 World Health Organization [facility]) criteria for post-menopausal, Caucasian Women: Normal       T-score at or above -1 SD Osteopenia   T-score between -1 and -2.5 SD Osteoporosis T-score at or below -2.5 SD RECOMMENDATION: 1. All patients should optimize calcium and vitamin D intake. 2. Consider FDA approved medical  therapies in postmenopausal women and men aged 29 years and older, based on the following: a. A hip or vertebral (clinical or morphometric) fracture b. T- score < or = -2.5 at the femoral neck or spine after appropriate evaluation to exclude secondary causes c. Low bone mass (T-score between -1.0 and -2.5 at the femoral neck or  spine) and a 10 year probability of a hip fracture > or = 3% or a 10 year probability of a major osteoporosis-related fracture > or = 20% based on the [place]-adapted WHO algorithm d. Clinician judgment and/or patient preferences may indicate treatment for people with 10-year fracture probabilities above or below these levels FOLLOW-UP: People with diagnosed cases of osteoporosis or at high risk for fracture should have regular bone mineral density tests. For patients eligible for Medicare, routine testing is allowed once every 2 years. The testing frequency can be increased to one year for patients who have rapidly progressing disease, those who are receiving or discontinuing medical therapy to restore bone mass, or have additional risk factors. I have reviewed this report and agree with the above findings. Mark A. Thornton Papas, M.D. [facility] FRAX* 10-year Probability of Fracture Based on femoral neck BMD: DualFemur (Left) Major Osteoporotic Fracture: 14.3% Hip Fracture:                4.1% Population:                  [place] (Caucasian) Risk Factors:                None *FRAX is a Materials engineer of the [employer] of [employer] for Metabolic Bone Disease, a World Pharmacologist (WHO) [facility]. ASSESSMENT: The probability of a major osteoporotic fracture is 14.3 % within the next ten years. The probability of a hip fracture is 4.1 % within the next ten years. I have reviewed this report and agree with the above findings. Mark A. Thornton Papas, M.D. [facility] Electronically Signed   By: Lavonia Dana M.D.   On: 08/30/2018 10:03    Assessment & Plan:    There are no diagnoses linked to this encounter.   No orders of the defined types were placed in this encounter.    Follow-up: No follow-ups on file.  Walker Kehr, MD

## 2018-09-07 NOTE — Assessment & Plan Note (Signed)
On B12 

## 2018-09-07 NOTE — Assessment & Plan Note (Signed)
Labs

## 2018-12-02 DIAGNOSIS — H02885 Meibomian gland dysfunction left lower eyelid: Secondary | ICD-10-CM | POA: Diagnosis not present

## 2018-12-02 DIAGNOSIS — H04123 Dry eye syndrome of bilateral lacrimal glands: Secondary | ICD-10-CM | POA: Diagnosis not present

## 2018-12-02 DIAGNOSIS — H02882 Meibomian gland dysfunction right lower eyelid: Secondary | ICD-10-CM | POA: Diagnosis not present

## 2018-12-02 DIAGNOSIS — L719 Rosacea, unspecified: Secondary | ICD-10-CM | POA: Diagnosis not present

## 2018-12-23 DIAGNOSIS — H04123 Dry eye syndrome of bilateral lacrimal glands: Secondary | ICD-10-CM | POA: Diagnosis not present

## 2018-12-23 DIAGNOSIS — H02882 Meibomian gland dysfunction right lower eyelid: Secondary | ICD-10-CM | POA: Diagnosis not present

## 2018-12-23 DIAGNOSIS — H353131 Nonexudative age-related macular degeneration, bilateral, early dry stage: Secondary | ICD-10-CM | POA: Diagnosis not present

## 2018-12-23 DIAGNOSIS — H02885 Meibomian gland dysfunction left lower eyelid: Secondary | ICD-10-CM | POA: Diagnosis not present

## 2019-01-04 DIAGNOSIS — L570 Actinic keratosis: Secondary | ICD-10-CM | POA: Diagnosis not present

## 2019-01-04 DIAGNOSIS — L578 Other skin changes due to chronic exposure to nonionizing radiation: Secondary | ICD-10-CM | POA: Diagnosis not present

## 2019-01-04 DIAGNOSIS — L718 Other rosacea: Secondary | ICD-10-CM | POA: Diagnosis not present

## 2019-01-06 DIAGNOSIS — M064 Inflammatory polyarthropathy: Secondary | ICD-10-CM | POA: Diagnosis not present

## 2019-01-06 DIAGNOSIS — M7071 Other bursitis of hip, right hip: Secondary | ICD-10-CM | POA: Diagnosis not present

## 2019-01-06 DIAGNOSIS — Z6821 Body mass index (BMI) 21.0-21.9, adult: Secondary | ICD-10-CM | POA: Diagnosis not present

## 2019-01-20 ENCOUNTER — Ambulatory Visit (INDEPENDENT_AMBULATORY_CARE_PROVIDER_SITE_OTHER): Payer: Medicare Other

## 2019-01-20 ENCOUNTER — Other Ambulatory Visit: Payer: Self-pay

## 2019-01-20 DIAGNOSIS — Z23 Encounter for immunization: Secondary | ICD-10-CM

## 2019-03-02 DIAGNOSIS — H02885 Meibomian gland dysfunction left lower eyelid: Secondary | ICD-10-CM | POA: Diagnosis not present

## 2019-03-02 DIAGNOSIS — H04123 Dry eye syndrome of bilateral lacrimal glands: Secondary | ICD-10-CM | POA: Diagnosis not present

## 2019-03-02 DIAGNOSIS — H353131 Nonexudative age-related macular degeneration, bilateral, early dry stage: Secondary | ICD-10-CM | POA: Diagnosis not present

## 2019-03-02 DIAGNOSIS — Z961 Presence of intraocular lens: Secondary | ICD-10-CM | POA: Diagnosis not present

## 2019-03-02 DIAGNOSIS — H02882 Meibomian gland dysfunction right lower eyelid: Secondary | ICD-10-CM | POA: Diagnosis not present

## 2019-03-10 ENCOUNTER — Ambulatory Visit (INDEPENDENT_AMBULATORY_CARE_PROVIDER_SITE_OTHER): Payer: Medicare Other | Admitting: Internal Medicine

## 2019-03-10 ENCOUNTER — Other Ambulatory Visit: Payer: Self-pay

## 2019-03-10 ENCOUNTER — Encounter: Payer: Self-pay | Admitting: Internal Medicine

## 2019-03-10 ENCOUNTER — Other Ambulatory Visit (INDEPENDENT_AMBULATORY_CARE_PROVIDER_SITE_OTHER): Payer: Medicare Other

## 2019-03-10 VITALS — BP 126/76 | HR 62 | Temp 98.5°F | Ht 64.0 in | Wt 130.0 lb

## 2019-03-10 DIAGNOSIS — D519 Vitamin B12 deficiency anemia, unspecified: Secondary | ICD-10-CM

## 2019-03-10 DIAGNOSIS — R7309 Other abnormal glucose: Secondary | ICD-10-CM

## 2019-03-10 DIAGNOSIS — R7989 Other specified abnormal findings of blood chemistry: Secondary | ICD-10-CM

## 2019-03-10 DIAGNOSIS — I10 Essential (primary) hypertension: Secondary | ICD-10-CM

## 2019-03-10 LAB — CBC WITH DIFFERENTIAL/PLATELET
Basophils Absolute: 0 10*3/uL (ref 0.0–0.1)
Basophils Relative: 0.5 % (ref 0.0–3.0)
Eosinophils Absolute: 0.1 10*3/uL (ref 0.0–0.7)
Eosinophils Relative: 2 % (ref 0.0–5.0)
HCT: 34.4 % — ABNORMAL LOW (ref 36.0–46.0)
Hemoglobin: 11.6 g/dL — ABNORMAL LOW (ref 12.0–15.0)
Lymphocytes Relative: 19.1 % (ref 12.0–46.0)
Lymphs Abs: 0.9 10*3/uL (ref 0.7–4.0)
MCHC: 33.7 g/dL (ref 30.0–36.0)
MCV: 91.5 fl (ref 78.0–100.0)
Monocytes Absolute: 0.3 10*3/uL (ref 0.1–1.0)
Monocytes Relative: 7.1 % (ref 3.0–12.0)
Neutro Abs: 3.4 10*3/uL (ref 1.4–7.7)
Neutrophils Relative %: 71.3 % (ref 43.0–77.0)
Platelets: 227 10*3/uL (ref 150.0–400.0)
RBC: 3.76 Mil/uL — ABNORMAL LOW (ref 3.87–5.11)
RDW: 13 % (ref 11.5–15.5)
WBC: 4.7 10*3/uL (ref 4.0–10.5)

## 2019-03-10 LAB — BASIC METABOLIC PANEL
BUN: 24 mg/dL — ABNORMAL HIGH (ref 6–23)
CO2: 26 mEq/L (ref 19–32)
Calcium: 9.8 mg/dL (ref 8.4–10.5)
Chloride: 101 mEq/L (ref 96–112)
Creatinine, Ser: 1 mg/dL (ref 0.40–1.20)
GFR: 53.31 mL/min — ABNORMAL LOW (ref >60.00)
Glucose, Bld: 110 mg/dL — ABNORMAL HIGH (ref 70–99)
Potassium: 4.4 mEq/L (ref 3.5–5.1)
Sodium: 137 mEq/L (ref 135–145)

## 2019-03-10 LAB — HEMOGLOBIN A1C: Hgb A1c MFr Bld: 5.7 % (ref 4.6–6.5)

## 2019-03-10 NOTE — Assessment & Plan Note (Signed)
Avapro 

## 2019-03-10 NOTE — Assessment & Plan Note (Signed)
GFR 51 Labs

## 2019-03-10 NOTE — Progress Notes (Signed)
Subjective:  Patient ID: Caroline Olson, female    DOB: 12/12/1940  Age: 78 y.o. MRN: [id number]  CC: No chief complaint on file.   HPI Caroline Olson presents for HTN, B12 def, elev glucose, decr GFR  Outpatient Medications Prior to Visit  Medication Sig Dispense Refill   cyanocobalamin 1000 MCG tablet Take 1,000 mcg by mouth daily.     irbesartan (AVAPRO) 150 MG tablet Take 1 tablet (150 mg total) by mouth daily. 90 tablet 3   metronidazole (NORITATE) 1 % cream Apply topically 2 (two) times daily.      Multiple Vitamins-Minerals (MULTIVITAMIN WITH MINERALS) tablet Take 1 tablet by mouth 3 (three) times a week.      NON FORMULARY [place] Retina po qd     VITAMIN D PO Take 1,000 Int'l Units by mouth.     No facility-administered medications prior to visit.    ROS: Review of Systems  Constitutional: Negative for activity change, appetite change, chills, diaphoresis, fatigue, fever and unexpected weight change.  HENT: Negative for congestion, dental problem, ear pain, hearing loss, mouth sores, postnasal drip, sinus pressure, sneezing, sore throat and voice change.   Eyes: Negative for pain and visual disturbance.  Respiratory: Negative for cough, chest tightness, wheezing and stridor.   Cardiovascular: Negative for chest pain, palpitations and leg swelling.  Gastrointestinal: Negative for abdominal distention, abdominal pain, blood in stool, nausea, rectal pain and vomiting.  Genitourinary: Negative for decreased urine volume, difficulty urinating, dysuria, frequency, hematuria, menstrual problem, vaginal bleeding, vaginal discharge and vaginal pain.  Musculoskeletal: Positive for arthralgias. Negative for back pain, gait problem, joint swelling and neck pain.  Skin: Negative for color change, rash and wound.  Neurological: Negative for dizziness, tremors, syncope, speech difficulty, weakness and light-headedness.  Hematological: Negative for adenopathy.  Psychiatric/Behavioral:  Negative for behavioral problems, confusion, decreased concentration, dysphoric mood, hallucinations, sleep disturbance and suicidal ideas. The patient is not nervous/anxious and is not hyperactive.     Objective:  BP 126/76 (BP Location: Left Arm, Patient Position: Sitting, Cuff Size: Normal)    Pulse 62    Temp 98.5 F (36.9 C) (Oral)    Ht 5\' 4"  (1.626 m)    Wt 130 lb (59 kg)    LMP 04/01/1983    SpO2 96%    BMI 22.31 kg/m   BP Readings from Last 3 Encounters:  03/10/19 126/76  09/07/18 126/70  07/09/18 120/80    Wt Readings from Last 3 Encounters:  03/10/19 130 lb (59 kg)  09/07/18 127 lb (57.6 kg)  07/09/18 127 lb (57.6 kg)    Physical Exam Constitutional:      General: She is not in acute distress.    Appearance: She is well-developed.  HENT:     Head: Normocephalic.     Right Ear: External ear normal.     Left Ear: External ear normal.     Nose: Nose normal.  Eyes:     General:        Right eye: No discharge.        Left eye: No discharge.     Conjunctiva/sclera: Conjunctivae normal.     Pupils: Pupils are equal, round, and reactive to light.  Neck:     Thyroid: No thyromegaly.     Vascular: No JVD.     Trachea: No tracheal deviation.  Cardiovascular:     Rate and Rhythm: Normal rate and regular rhythm.     Heart sounds: Normal heart sounds.  Pulmonary:  Effort: No respiratory distress.     Breath sounds: No stridor. No wheezing.  Abdominal:     General: Bowel sounds are normal. There is no distension.     Palpations: Abdomen is soft. There is no mass.     Tenderness: There is no abdominal tenderness. There is no guarding or rebound.  Musculoskeletal:        General: No tenderness.     Cervical back: Normal range of motion and neck supple.  Lymphadenopathy:     Cervical: No cervical adenopathy.  Skin:    Findings: No erythema or rash.  Neurological:     Cranial Nerves: No cranial nerve deficit.     Motor: No abnormal muscle tone.     Coordination:  Coordination normal.     Deep Tendon Reflexes: Reflexes normal.  Psychiatric:        Behavior: Behavior normal.        Thought Content: Thought content normal.        Judgment: Judgment normal.     Lab Results  Component Value Date   WBC 5.6 03/08/2018   HGB 11.4 (L) 03/08/2018   HCT 33.3 (L) 03/08/2018   PLT 257.0 03/08/2018   GLUCOSE 100 (H) 09/07/2018   CHOL 197 03/08/2018   TRIG 43.0 03/08/2018   HDL 91.40 03/08/2018   LDLDIRECT 104.3 06/07/2008   LDLCALC 97 03/08/2018   ALT 10 03/08/2018   AST 15 03/08/2018   NA 137 09/07/2018   K 4.8 09/07/2018   CL 101 09/07/2018   CREATININE 1.03 09/07/2018   BUN 22 09/07/2018   CO2 30 09/07/2018   TSH 0.91 09/07/2018   HGBA1C 5.8 09/07/2018    DG Bone Density  Result Date: 08/30/2018 EXAM: DUAL X-RAY ABSORPTIOMETRY (DXA) FOR BONE MINERAL DENSITY IMPRESSION: Referring Physician:  Megan Salon Your patient completed a BMD test using Lunar IDXA DXA system ( analysis version: 16 ) manufactured by [employer]. Technologist: AW PATIENT: Name: Caroline, Olson Patient ID:  [id number] Birth Date: 05/24/1940 Height:     66.0 in. Sex: Female Measured: 08/30/2018 Weight: 140.0 lbs. Indications: Advanced Age, Breast Cancer History, Caucasian, Estrogen Deficient, osteoarthiritis, Postmenopausal Fractures: None Treatments: Vitamin D (E933.5) ASSESSMENT: The BMD measured at Femur Neck Left is 0.775 g/cm2 with a T-score of -1.9. This patient is considered OSTEOPENIC according to [facility]) criteria. There has been a statistically significant decrease in BMD of Left hip since prior exam dated 02/26/2004. The scan quality is good. L-2 was excluded due to degenerative changes. Spine was not compared to prior study due to exclusion of vertebral bodies on current exam. Prior DXA exam performed on Hologic device measures only unilateral hip (not Total Mean Hip). Therefore, current Total Mean Hip cannot be compared to prior exam. Site  Region Measured Date Measured Age YA T-score BMD Significant CHANGE DualFemur Neck Left 08/30/2018 79.7 -1.9 0.775 g/cm2 * DualFemur Neck Left  02/26/2004    65.2         -1.4    0.842 g/cm2 AP Spine  L1-L4 (L2) 08/30/2018    79.7         -1.2    1.027 g/cm2 DualFemur Total Mean 08/30/2018    79.7         -1.2    0.856 g/cm2 World Health Organization [facility]) criteria for post-menopausal, Caucasian Women: Normal       T-score at or above -1 SD Osteopenia   T-score between -1 and -2.5 SD Osteoporosis T-score  at or below -2.5 SD RECOMMENDATION: 1. All patients should optimize calcium and vitamin D intake. 2. Consider FDA approved medical therapies in postmenopausal women and men aged 63 years and older, based on the following: a. A hip or vertebral (clinical or morphometric) fracture b. T- score < or = -2.5 at the femoral neck or spine after appropriate evaluation to exclude secondary causes c. Low bone mass (T-score between -1.0 and -2.5 at the femoral neck or spine) and a 10 year probability of a hip fracture > or = 3% or a 10 year probability of a major osteoporosis-related fracture > or = 20% based on the [place]-adapted WHO algorithm d. Clinician judgment and/or patient preferences may indicate treatment for people with 10-year fracture probabilities above or below these levels FOLLOW-UP: People with diagnosed cases of osteoporosis or at high risk for fracture should have regular bone mineral density tests. For patients eligible for Medicare, routine testing is allowed once every 2 years. The testing frequency can be increased to one year for patients who have rapidly progressing disease, those who are receiving or discontinuing medical therapy to restore bone mass, or have additional risk factors. I have reviewed this report and agree with the above findings. Mark A. Thornton Papas, M.D. [facility] FRAX* 10-year Probability of Fracture Based on femoral neck BMD: DualFemur (Left) Major Osteoporotic Fracture: 14.3% Hip  Fracture:                4.1% Population:                  [place] (Caucasian) Risk Factors:                None *FRAX is a Materials engineer of the [employer] for Metabolic Bone Disease, a World Pharmacologist (WHO) [facility]. ASSESSMENT: The probability of a major osteoporotic fracture is 14.3 % within the next ten years. The probability of a hip fracture is 4.1 % within the next ten years. I have reviewed this report and agree with the above findings. Mark A. Thornton Papas, M.D. [facility] Electronically Signed   By: Lavonia Dana M.D.   On: 08/30/2018 10:03    Assessment & Plan:   There are no diagnoses linked to this encounter.   No orders of the defined types were placed in this encounter.    Follow-up: No follow-ups on file.  Walker Kehr, MD

## 2019-03-10 NOTE — Assessment & Plan Note (Signed)
A1c

## 2019-03-10 NOTE — Patient Instructions (Signed)

## 2019-03-10 NOTE — Assessment & Plan Note (Addendum)
On B12 CBC 

## 2019-03-21 ENCOUNTER — Other Ambulatory Visit: Payer: Self-pay | Admitting: Obstetrics & Gynecology

## 2019-03-21 DIAGNOSIS — Z1231 Encounter for screening mammogram for malignant neoplasm of breast: Secondary | ICD-10-CM

## 2019-03-24 ENCOUNTER — Encounter: Payer: Self-pay | Admitting: Internal Medicine

## 2019-04-14 ENCOUNTER — Other Ambulatory Visit: Payer: Self-pay

## 2019-04-14 ENCOUNTER — Ambulatory Visit (INDEPENDENT_AMBULATORY_CARE_PROVIDER_SITE_OTHER)
Admission: RE | Admit: 2019-04-14 | Discharge: 2019-04-14 | Disposition: A | Discharge status: Home / Self Care | Payer: Self-pay | Source: Ambulatory Visit | Attending: Internal Medicine | Admitting: Internal Medicine

## 2019-04-14 DIAGNOSIS — R7309 Other abnormal glucose: Secondary | ICD-10-CM

## 2019-04-14 DIAGNOSIS — I10 Essential (primary) hypertension: Secondary | ICD-10-CM

## 2019-04-15 ENCOUNTER — Other Ambulatory Visit: Payer: Self-pay | Admitting: Internal Medicine

## 2019-04-15 MED ORDER — ASPIRIN EC 81 MG PO TBEC: 81.0000 mg | DELAYED_RELEASE_TABLET | Freq: Every day | ORAL | 3 refills | Status: AC

## 2019-04-15 NOTE — Progress Notes (Signed)
Aspirin

## 2019-05-02 ENCOUNTER — Ambulatory Visit: Payer: Medicare Other

## 2019-05-04 ENCOUNTER — Other Ambulatory Visit: Payer: Self-pay

## 2019-05-04 ENCOUNTER — Ambulatory Visit
Admission: RE | Admit: 2019-05-04 | Discharge: 2019-05-04 | Disposition: A | Discharge status: Home / Self Care | Payer: Medicare Other | Source: Ambulatory Visit | Attending: Obstetrics & Gynecology | Admitting: Obstetrics & Gynecology

## 2019-05-04 DIAGNOSIS — Z1231 Encounter for screening mammogram for malignant neoplasm of breast: Secondary | ICD-10-CM

## 2019-05-08 ENCOUNTER — Ambulatory Visit: Payer: Medicare Other | Attending: Internal Medicine

## 2019-05-08 DIAGNOSIS — Z23 Encounter for immunization: Secondary | ICD-10-CM | POA: Insufficient documentation

## 2019-05-08 NOTE — Progress Notes (Signed)
   Covid-19 Vaccination Clinic  Name:  SEQUENA ZEIDAN    MRN: [id number] DOB: [birth year]  05/08/2019  Ms. Siek was observed post Covid-19 immunization for 15 minutes without incidence. She was provided with Vaccine Information Sheet and instruction to access the V-Safe system.   Ms. Dombrosky was instructed to call 911 with any severe reactions post vaccine: Marland Kitchen Difficulty breathing  . Swelling of your face and throat  . A fast heartbeat  . A bad rash all over your body  . Dizziness and weakness    Immunizations Administered    Name Date Dose VIS Date Route   Pfizer COVID-19 Vaccine 05/08/2019 11:14 AM 0.3 mL 03/11/2019 Intramuscular   Manufacturer: [place]   Lot: [id number]   [place]: [id number]

## 2019-05-23 ENCOUNTER — Ambulatory Visit: Payer: Medicare Other

## 2019-06-07 ENCOUNTER — Ambulatory Visit: Payer: Medicare Other | Attending: Internal Medicine

## 2019-06-07 DIAGNOSIS — Z23 Encounter for immunization: Secondary | ICD-10-CM | POA: Insufficient documentation

## 2019-06-07 NOTE — Progress Notes (Signed)
   Covid-19 Vaccination Clinic  Name:  Caroline Olson    MRN: [id number] DOB: 04-Apr-1940  06/07/2019  [name] was observed post Covid-19 immunization for 15 minutes without incident. She was provided with Vaccine Information Sheet and instruction to access the V-Safe system.   [name] was instructed to call 911 with any severe reactions post vaccine: Marland Kitchen Difficulty breathing  . Swelling of face and throat  . A fast heartbeat  . A bad rash all over body  . Dizziness and weakness   Immunizations Administered    Name Date Dose VIS Date Route   Pfizer COVID-19 Vaccine 06/07/2019  8:21 AM 0.3 mL 03/11/2019 Intramuscular   Manufacturer: [place]   Lot: [id number]   [place]: [id number]

## 2019-09-08 ENCOUNTER — Encounter: Payer: Self-pay | Admitting: Internal Medicine

## 2019-09-08 ENCOUNTER — Other Ambulatory Visit: Payer: Self-pay

## 2019-09-08 ENCOUNTER — Ambulatory Visit (INDEPENDENT_AMBULATORY_CARE_PROVIDER_SITE_OTHER): Payer: Medicare Other | Admitting: Internal Medicine

## 2019-09-08 ENCOUNTER — Ambulatory Visit (INDEPENDENT_AMBULATORY_CARE_PROVIDER_SITE_OTHER): Payer: Medicare Other

## 2019-09-08 VITALS — BP 150/80 | HR 62 | Temp 98.3°F | Resp 16 | Ht 64.0 in | Wt 125.0 lb

## 2019-09-08 VITALS — BP 150/80 | HR 62 | Temp 98.3°F | Ht 64.0 in | Wt 125.0 lb

## 2019-09-08 DIAGNOSIS — D519 Vitamin B12 deficiency anemia, unspecified: Secondary | ICD-10-CM

## 2019-09-08 DIAGNOSIS — E785 Hyperlipidemia, unspecified: Secondary | ICD-10-CM

## 2019-09-08 DIAGNOSIS — E559 Vitamin D deficiency, unspecified: Secondary | ICD-10-CM

## 2019-09-08 DIAGNOSIS — I10 Essential (primary) hypertension: Secondary | ICD-10-CM

## 2019-09-08 DIAGNOSIS — R7309 Other abnormal glucose: Secondary | ICD-10-CM | POA: Diagnosis not present

## 2019-09-08 DIAGNOSIS — Z0000 Encounter for general adult medical examination without abnormal findings: Secondary | ICD-10-CM | POA: Diagnosis not present

## 2019-09-08 DIAGNOSIS — I83893 Varicose veins of bilateral lower extremities with other complications: Secondary | ICD-10-CM

## 2019-09-08 LAB — HEPATIC FUNCTION PANEL
ALT: 14 U/L (ref 0–35)
AST: 19 U/L (ref 0–37)
Albumin: 4.5 g/dL (ref 3.5–5.2)
Alkaline Phosphatase: 72 U/L (ref 39–117)
Bilirubin, Direct: 0.1 mg/dL (ref 0.0–0.3)
Total Bilirubin: 0.5 mg/dL (ref 0.2–1.2)
Total Protein: 7.1 g/dL (ref 6.0–8.3)

## 2019-09-08 LAB — LIPID PANEL
Cholesterol: 206 mg/dL — ABNORMAL HIGH (ref 0–200)
HDL: 86.4 mg/dL (ref >39.00)
LDL Cholesterol: 109 mg/dL — ABNORMAL HIGH (ref 0–99)
NonHDL: 119.91
Total CHOL/HDL Ratio: 2
Triglycerides: 57 mg/dL (ref 0.0–149.0)
VLDL: 11.4 mg/dL (ref 0.0–40.0)

## 2019-09-08 LAB — URINALYSIS
Bilirubin Urine: NEGATIVE
Hgb urine dipstick: NEGATIVE
Ketones, ur: NEGATIVE
Leukocytes,Ua: NEGATIVE
Nitrite: NEGATIVE
Specific Gravity, Urine: 1.025 (ref 1.000–1.030)
Total Protein, Urine: NEGATIVE
Urine Glucose: NEGATIVE
Urobilinogen, UA: 0.2 (ref 0.0–1.0)
pH: 6 (ref 5.0–8.0)

## 2019-09-08 LAB — BASIC METABOLIC PANEL
BUN: 23 mg/dL (ref 6–23)
CO2: 29 mEq/L (ref 19–32)
Calcium: 10 mg/dL (ref 8.4–10.5)
Chloride: 101 mEq/L (ref 96–112)
Creatinine, Ser: 1.02 mg/dL (ref 0.40–1.20)
GFR: 52.04 mL/min — ABNORMAL LOW (ref >60.00)
Glucose, Bld: 107 mg/dL — ABNORMAL HIGH (ref 70–99)
Potassium: 4.7 mEq/L (ref 3.5–5.1)
Sodium: 136 mEq/L (ref 135–145)

## 2019-09-08 LAB — CBC WITH DIFFERENTIAL/PLATELET
Basophils Absolute: 0 10*3/uL (ref 0.0–0.1)
Basophils Relative: 0.9 % (ref 0.0–3.0)
Eosinophils Absolute: 0.1 10*3/uL (ref 0.0–0.7)
Eosinophils Relative: 2.5 % (ref 0.0–5.0)
HCT: 34.6 % — ABNORMAL LOW (ref 36.0–46.0)
Hemoglobin: 11.8 g/dL — ABNORMAL LOW (ref 12.0–15.0)
Lymphocytes Relative: 23 % (ref 12.0–46.0)
Lymphs Abs: 1 10*3/uL (ref 0.7–4.0)
MCHC: 34.1 g/dL (ref 30.0–36.0)
MCV: 91.8 fl (ref 78.0–100.0)
Monocytes Absolute: 0.3 10*3/uL (ref 0.1–1.0)
Monocytes Relative: 7.5 % (ref 3.0–12.0)
Neutro Abs: 2.8 10*3/uL (ref 1.4–7.7)
Neutrophils Relative %: 66.1 % (ref 43.0–77.0)
Platelets: 228 10*3/uL (ref 150.0–400.0)
RBC: 3.76 Mil/uL — ABNORMAL LOW (ref 3.87–5.11)
RDW: 13.2 % (ref 11.5–15.5)
WBC: 4.3 10*3/uL (ref 4.0–10.5)

## 2019-09-08 LAB — VITAMIN D 25 HYDROXY (VIT D DEFICIENCY, FRACTURES): VITD: 60.13 ng/mL (ref 30.00–100.00)

## 2019-09-08 MED ORDER — AMLODIPINE BESYLATE 2.5 MG PO TABS: 2.5000 mg | ORAL_TABLET | Freq: Every day | ORAL | 3 refills | Status: DC

## 2019-09-08 NOTE — Patient Instructions (Addendum)
Caroline Olson , Thank you for taking time to come for your Medicare Wellness Visit. I appreciate your ongoing commitment to your health goals. Please review the following plan we discussed and let me know if I can assist you in the future.   Screening recommendations/referrals: Colonoscopy: last done 03/04/2018   Mammogram: last done 05/04/2019 Bone Density: last done 08/30/2018 Recommended yearly ophthalmology/optometry visit for glaucoma screening and checkup Recommended yearly dental visit for hygiene and checkup  Vaccinations: Influenza vaccine: 01/20/2019 Pneumococcal vaccine: completed Tdap vaccine: 06/30/2013; due every 10 years Shingles vaccine: completed   Covid-19: completed  Advanced directives: Yes  Conditions/risks identified: Yes  Next appointment: Please schedule your 1 year Medicare Wellness Visit with your Health Coach.   Preventive Care 38 Years and Older, Female Preventive care refers to lifestyle choices and visits with your health care provider that can promote health and wellness. What does preventive care include?  A yearly physical exam. This is also called an annual well check.  Dental exams once or twice a year.  Routine eye exams. Ask your health care provider how often you should have your eyes checked.  Personal lifestyle choices, including:  Daily care of your teeth and gums.  Regular physical activity.  Eating a healthy diet.  Avoiding tobacco and drug use.  Limiting alcohol use.  Practicing safe sex.  Taking low-dose aspirin every day.  Taking vitamin and mineral supplements as recommended by your health care provider. What happens during an annual well check? The services and screenings done by your health care provider during your annual well check will depend on your age, overall health, lifestyle risk factors, and family history of disease. Counseling  Your health care provider may ask you questions about your:  Alcohol  use.  Tobacco use.  Drug use.  Emotional well-being.  Home and relationship well-being.  Sexual activity.  Eating habits.  History of falls.  Memory and ability to understand (cognition).  Work and work Statistician.  Reproductive health. Screening  You may have the following tests or measurements:  Height, weight, and BMI.  Blood pressure.  Lipid and cholesterol levels. These may be checked every 5 years, or more frequently if you are over 1 years old.  Skin check.  Lung cancer screening. You may have this screening every year starting at age 77 if you have a 30-pack-year history of smoking and currently smoke or have quit within the past 15 years.  Fecal occult blood test (FOBT) of the stool. You may have this test every year starting at age 39.  Flexible sigmoidoscopy or colonoscopy. You may have a sigmoidoscopy every 5 years or a colonoscopy every 10 years starting at age 29.  Hepatitis C blood test.  Hepatitis B blood test.  Sexually transmitted disease (STD) testing.  Diabetes screening. This is done by checking your blood sugar (glucose) after you have not eaten for a while (fasting). You may have this done every 1-3 years.  Bone density scan. This is done to screen for osteoporosis. You may have this done starting at age 70.  Mammogram. This may be done every 1-2 years. Talk to your health care provider about how often you should have regular mammograms. Talk with your health care provider about your test results, treatment options, and if necessary, the need for more tests. Vaccines  Your health care provider may recommend certain vaccines, such as:  Influenza vaccine. This is recommended every year.  Tetanus, diphtheria, and acellular pertussis (Tdap, Td) vaccine. You  may need a Td booster every 10 years.  Zoster vaccine. You may need this after age 33.  Pneumococcal 13-valent conjugate (PCV13) vaccine. One dose is recommended after age  42.  Pneumococcal polysaccharide (PPSV23) vaccine. One dose is recommended after age 59. Talk to your health care provider about which screenings and vaccines you need and how often you need them. This information is not intended to replace advice given to you by your health care provider. Make sure you discuss any questions you have with your health care provider. Document Released: 04/13/2015 Document Revised: 12/05/2015 Document Reviewed: 01/16/2015 Elsevier Interactive Patient Education  2017 [place] Prevention in the Home Falls can cause injuries. They can happen to people of all ages. There are many things you can do to make your home safe and to help prevent falls. What can I do on the outside of my home?  Regularly fix the edges of walkways and driveways and fix any cracks.  Remove anything that might make you trip as you walk through a door, such as a raised step or threshold.  Trim any bushes or trees on the path to your home.  Use bright outdoor lighting.  Clear any walking paths of anything that might make someone trip, such as rocks or tools.  Regularly check to see if handrails are loose or broken. Make sure that both sides of any steps have handrails.  Any raised decks and porches should have guardrails on the edges.  Have any leaves, snow, or ice cleared regularly.  Use sand or salt on walking paths during winter.  Clean up any spills in your garage right away. This includes oil or grease spills. What can I do in the bathroom?  Use night lights.  Install grab bars by the toilet and in the tub and shower. Do not use towel bars as grab bars.  Use non-skid mats or decals in the tub or shower.  If you need to sit down in the shower, use a plastic, non-slip stool.  Keep the floor dry. Clean up any water that spills on the floor as soon as it happens.  Remove soap buildup in the tub or shower regularly.  Attach bath mats securely with double-sided  non-slip rug tape.  Do not have throw rugs and other things on the floor that can make you trip. What can I do in the bedroom?  Use night lights.  Make sure that you have a light by your bed that is easy to reach.  Do not use any sheets or blankets that are too big for your bed. They should not hang down onto the floor.  Have a firm chair that has side arms. You can use this for support while you get dressed.  Do not have throw rugs and other things on the floor that can make you trip. What can I do in the kitchen?  Clean up any spills right away.  Avoid walking on wet floors.  Keep items that you use a lot in easy-to-reach places.  If you need to reach something above you, use a strong step stool that has a grab bar.  Keep electrical cords out of the way.  Do not use floor polish or wax that makes floors slippery. If you must use wax, use non-skid floor wax.  Do not have throw rugs and other things on the floor that can make you trip. What can I do with my stairs?  Do not leave any items  on the stairs.  Make sure that there are handrails on both sides of the stairs and use them. Fix handrails that are broken or loose. Make sure that handrails are as long as the stairways.  Check any carpeting to make sure that it is firmly attached to the stairs. Fix any carpet that is loose or worn.  Avoid having throw rugs at the top or bottom of the stairs. If you do have throw rugs, attach them to the floor with carpet tape.  Make sure that you have a light switch at the top of the stairs and the bottom of the stairs. If you do not have them, ask someone to add them for you. What else can I do to help prevent falls?  Wear shoes that:  Do not have high heels.  Have rubber bottoms.  Are comfortable and fit you well.  Are closed at the toe. Do not wear sandals.  If you use a stepladder:  Make sure that it is fully opened. Do not climb a closed stepladder.  Make sure that both  sides of the stepladder are locked into place.  Ask someone to hold it for you, if possible.  Clearly mark and make sure that you can see:  Any grab bars or handrails.  First and last steps.  Where the edge of each step is.  Use tools that help you move around (mobility aids) if they are needed. These include:  Canes.  Walkers.  Scooters.  Crutches.  Turn on the lights when you go into a dark area. Replace any light bulbs as soon as they burn out.  Set up your furniture so you have a clear path. Avoid moving your furniture around.  If any of your floors are uneven, fix them.  If there are any pets around you, be aware of where they are.  Review your medicines with your doctor. Some medicines can make you feel dizzy. This can increase your chance of falling. Ask your doctor what other things that you can do to help prevent falls. This information is not intended to replace advice given to you by your health care provider. Make sure you discuss any questions you have with your health care provider. Document Released: 01/11/2009 Document Revised: 08/23/2015 Document Reviewed: 04/21/2014 Elsevier Interactive Patient Education  2017 [employer].

## 2019-09-08 NOTE — Assessment & Plan Note (Signed)
LLE varic veins >RLE  Vasc surgery eval was offered

## 2019-09-08 NOTE — Progress Notes (Addendum)
Subjective:   Caroline Olson is a 79 y.o. female who presents for Medicare Annual (Subsequent) preventive examination.  Review of Systems:  No ROS. Medicare Wellness Visit Cardiac Risk Factors include: advanced age (>72men, >50 women);family history of premature cardiovascular disease     Objective:     Vitals: BP (!) 150/80 (BP Location: Left Arm, Patient Position: Sitting, Cuff Size: Normal)   Pulse 62   Temp 98.3 F (36.8 C)   Resp 16   Ht 5\' 4"  (1.626 m)   Wt 125 lb (56.7 kg)   LMP 04/01/1983   SpO2 98%   BMI 21.46 kg/m   Body mass index is 21.46 kg/m.  Advanced Directives 09/08/2019 08/31/2018 08/17/2017 08/04/2016 02/05/2015  Does Patient Have a Medical Advance Directive? Yes Yes Yes Yes Yes  Type of Advance Directive Living will;Healthcare Power of [place];Living will [place];Living will [place];Living will -  Does patient want to make changes to medical advance directive? No - Patient declined - - Yes (ED - Information included in AVS) -  Copy of [place] in Chart? - No - copy requested No - copy requested Yes Yes    Tobacco Social History   Tobacco Use  Smoking Status Never Smoker  Smokeless Tobacco Never Used     Counseling given: No   Clinical Intake:  Pre-visit preparation completed: Yes  Pain : No/denies pain Pain Score: 0-No pain     BMI - recorded: 21.46 Nutritional Status: BMI of 19-24  Normal Nutritional Risks: None Diabetes: No  How often do you need to have someone help you when you read instructions, pamphlets, or other written materials from your doctor or pharmacy?: 1 - Never What is the last grade level you completed in school?: College  Interpreter Needed?: No  Information entered by :: Rett Stehlik N. Ahyana Skillin, LPN  Past Medical History:  Diagnosis Date   Adenomatous colon polyp 2014   Allergy    Anemia    Arthritis    Breast cancer ([place])  2011   DCIF   Hypertension    Osteopenia    Personal history of radiation therapy    Varicose veins    Past Surgical History:  Procedure Laterality Date   ABDOMINAL HYSTERECTOMY  1985   APPENDECTOMY  1956   pt states appendix was on left side not rt. per surgeon   BREAST LUMPECTOMY Left 2011   left, reexcision 1/12   CATARACT EXTRACTION, BILATERAL     COLONOSCOPY     COLONOSCOPY W/ POLYPECTOMY  2014   ENDOVENOUS ABLATION SAPHENOUS VEIN W/ LASER Left 01-27-2013   left greater saphenous vein and sclerotherapy left leg by Curt Jews MD   ENDOVENOUS ABLATION SAPHENOUS VEIN W/ LASER Right 02-10-2013   right greater saphenous vein and sclerotherapy right leg by Curt Jews MD   POLYPECTOMY     ROTATOR CUFF REPAIR Right 2009   SHOULDER SURGERY  2003   tear   stab phlebectomy Left 07-21-2013   10-20 incisions left leg by Curt Jews MD   TRICEPS TENDON REPAIR  2009   Family History  Problem Relation Age of Onset   Cancer Mother    Hypertension Mother    Other Mother        varicose veins   Heart disease Father    Diabetes Father    Colon cancer Neg Hx    Colon polyps Neg Hx    Esophageal cancer  Neg Hx    Rectal cancer Neg Hx    Stomach cancer Neg Hx    Social History   Socioeconomic History   Marital status: Married    Spouse name: Not on file   Number of children: 2   Years of education: Not on file   Highest education level: Not on file  Occupational History   Occupation: retired   Tobacco Use   Smoking status: Never Smoker   Smokeless tobacco: Never Used  Scientific laboratory technician Use: Never used  Substance and Sexual Activity   Alcohol use: Yes    Alcohol/week: 5.0 standard drinks    Types: 5 Glasses of wine per week   Drug use: No   Sexual activity: Yes    Partners: Male    Birth control/protection: Surgical    Comment: TAH/BSO  Other Topics Concern   Not on file  Social History Narrative   Patient is married she is retired and has 2 daughters   Some  alcohol use no tobacco or drug use   Social Determinants of Radio broadcast assistant Strain:    Difficulty of Paying Living Expenses:   Food Insecurity:    Worried About Charity fundraiser in the Last Year:    Arboriculturist in the Last Year:   Transportation Needs:    Film/video editor (Medical):    Lack of Transportation (Non-Medical):   Physical Activity:    Days of Exercise per Week:    Minutes of Exercise per Session:   Stress:    Feeling of Stress :   Social Connections:    Frequency of Communication with Friends and Family:    Frequency of Social Gatherings with Friends and Family:    Attends Religious Services:    Active Member of Clubs or Organizations:    Attends Archivist Meetings:    Marital Status:     Outpatient Encounter Medications as of 09/08/2019  Medication Sig   amLODipine (NORVASC) 2.5 MG tablet Take 1 tablet (2.5 mg total) by mouth daily.   aspirin EC 81 MG tablet Take 1 tablet (81 mg total) by mouth daily.   cyanocobalamin 1000 MCG tablet Take 1,000 mcg by mouth daily.   irbesartan (AVAPRO) 150 MG tablet Take 1 tablet (150 mg total) by mouth daily.   metronidazole (NORITATE) 1 % cream Apply topically 2 (two) times daily.    Multiple Vitamins-Minerals (MULTIVITAMIN WITH MINERALS) tablet Take 1 tablet by mouth 3 (three) times a week.    NON FORMULARY [place] Retina po qd   VITAMIN D PO Take 1,000 Int'l Units by mouth.   No facility-administered encounter medications on file as of 09/08/2019.    Activities of Daily Living In your present state of health, do you have any difficulty performing the following activities: 09/08/2019  Hearing? N  Vision? N  Difficulty concentrating or making decisions? N  Walking or climbing stairs? N  Dressing or bathing? N  Doing errands, shopping? N  Preparing Food and eating ? N  Using the Toilet? N  In the past six months, have you accidently leaked urine? N  Do you have problems with loss of bowel  control? N  Managing your Medications? N  Managing your Finances? N  Housekeeping or managing your Housekeeping? N  Some recent data might be hidden    Patient Care Team: Plotnikov, Evie Lacks, MD as PCP - General Magrinat, Virgie Dad, MD (Hematology and Oncology) Coralie Keens,  MD (General Surgery) Megan Salon, MD as Attending Physician (Gynecology) Gavin Pound, MD as Consulting Physician (Rheumatology) Almedia Balls, MD as Consulting Physician (Orthopedic Surgery) Ardath Sax, MD (Inactive) as Consulting Physician (Hematology and Oncology)    Assessment:   This is a routine wellness examination for [facility].  Exercise Activities and Dietary recommendations Current Exercise Habits: Home exercise routine, Type of exercise: walking (hikes 1 day a week, gardening; weight bearing exercises), Time (Minutes): 60, Frequency (Times/Week): 7, Weekly Exercise (Minutes/Week): 420, Intensity: Moderate, Exercise limited by: None identified  Goals       Patient Stated      Continue to be active, hike weekly, walk daily, eat healthy, travel, enjoy life and family.      Patient Stated (pt-stated)      To maintain my current health status by continuing to eat healthy and stay physically active & socially active.        Fall Risk Fall Risk  09/08/2019 08/31/2018 08/17/2017 08/04/2016 06/20/2016  Falls in the past year? 0 0 No Yes Yes  Comment - - - - -  Number falls in past yr: 0 0 - 2 or more 2 or more  Injury with Fall? 0 - - No No  Risk for fall due to : No Fall Risks - - - -  Follow up Falls evaluation completed;Education provided - - - -   Is the patient's home free of loose throw rugs in walkways, pet beds, electrical cords, etc?   yes      Grab bars in the bathroom? no      Handrails on the stairs?   yes      Adequate lighting?   yes  Timed Get Up and Go performed: not indicated  Depression Screen PHQ 2/9 Scores 09/08/2019 08/31/2018 08/17/2017 08/04/2016  PHQ - 2 Score 0 0 0 0   PHQ- 9 Score - - 0 -     Cognitive Function MMSE - Mini Mental State Exam 08/17/2017  Orientation to time 5  Orientation to Place 5  Registration 3  Attention/ Calculation 5  Recall 3  Language- name 2 objects 2  Language- repeat 1  Language- follow 3 step command 3  Language- read & follow direction 1  Write a sentence 1  Copy design 1  Total score 30     6CIT Screen 09/08/2019  What Year? 0 points  What month? 0 points  What time? 0 points  Count back from 20 0 points  Months in reverse 0 points  Repeat phrase 0 points  Total Score 0    Immunization History  Administered Date(s) Administered   Fluad Quad(high Dose 65+) 01/20/2019   Influenza Split 02/13/2015   Influenza,inj,Quad PF,6+ Mos 02/09/2016, 01/30/2017, 01/30/2018   Influenza-Unspecified 01/18/2013, 01/12/2014, 01/30/2016   PFIZER SARS-COV-2 Vaccination 05/08/2019, 06/07/2019   Pneumococcal Conjugate-13 01/25/2014   Pneumococcal Polysaccharide-23 07/08/2009   Td 05/30/2003   Tdap 06/30/2013   Zoster 06/07/2008   Zoster Recombinat (Shingrix) 11/11/2016, 01/01/2017    Qualifies for Shingles Vaccine? completed  Screening Tests Health Maintenance  Topic Date Due   INFLUENZA VACCINE  10/30/2019   TETANUS/TDAP  07/01/2023   DEXA SCAN  Completed   COVID-19 Vaccine  Completed   PNA vac Low Risk Adult  Completed    Cancer Screenings: Lung: Low Dose CT Chest recommended if Age 79-80 years, 30 pack-year currently smoking OR have quit w/in 15years. Patient does not qualify. Breast:  Up to date on Mammogram? Yes  Up to date of Bone Density/Dexa? Yes Colorectal: Yes  Additional Screenings: Hepatitis C Screening: never done     Plan:    Reviewed health maintenance screenings with patient today and relevant education, vaccines, and/or referrals were provided.    Continue doing brain stimulating activities (puzzles, reading, adult coloring books, staying active) to keep memory sharp.    Continue to  eat heart healthy diet (full of fruits, vegetables, whole grains, lean protein, water--limit salt, fat, and sugar intake) and increase physical activity as tolerated.  I have personally reviewed and noted the following in the patient's chart:   Medical and social history Use of alcohol, tobacco or illicit drugs  Current medications and supplements Functional ability and status Nutritional status Physical activity Advanced directives List of other physicians Hospitalizations, surgeries, and ER visits in previous 12 months Vitals Screenings to include cognitive, depression, and falls Referrals and appointments  In addition, I have reviewed and discussed with patient certain preventive protocols, quality metrics, and best practice recommendations. A written personalized care plan for preventive services as well as general preventive health recommendations were provided to patient.     Sheral Flow, LPN  6/39/4320  Nurse Health Advisor   Medical screening examination/treatment/procedure(s) were performed by non-physician practitioner and as supervising physician I was immediately available for consultation/collaboration.  I agree with above. Lew Dawes, MD

## 2019-09-08 NOTE — Assessment & Plan Note (Signed)
Not well controlled -- added Norvasc

## 2019-09-08 NOTE — Assessment & Plan Note (Signed)
A1c

## 2019-09-08 NOTE — Progress Notes (Signed)
Subjective:  Patient ID: Caroline Olson, female    DOB: [birth year]  Age: 79 y.o. MRN: [id number]  CC: No chief complaint on file.   HPI Caroline Olson presents for HTN - SBP 126-180 at home; elevated glucose, B12 def f/u Walking daily and hiking 2/week  Outpatient Medications Prior to Visit  Medication Sig Dispense Refill   aspirin EC 81 MG tablet Take 1 tablet (81 mg total) by mouth daily. 100 tablet 3   cyanocobalamin 1000 MCG tablet Take 1,000 mcg by mouth daily.     irbesartan (AVAPRO) 150 MG tablet Take 1 tablet (150 mg total) by mouth daily. 90 tablet 3   metronidazole (NORITATE) 1 % cream Apply topically 2 (two) times daily.      Multiple Vitamins-Minerals (MULTIVITAMIN WITH MINERALS) tablet Take 1 tablet by mouth 3 (three) times a week.      NON FORMULARY [place] Retina po qd     VITAMIN D PO Take 1,000 Int'l Units by mouth.     No facility-administered medications prior to visit.    ROS: Review of Systems  Constitutional: Negative for activity change, appetite change, chills, fatigue and unexpected weight change.  HENT: Negative for congestion, mouth sores and sinus pressure.   Eyes: Negative for visual disturbance.  Respiratory: Negative for cough and chest tightness.   Gastrointestinal: Negative for abdominal pain and nausea.  Genitourinary: Negative for difficulty urinating, frequency and vaginal pain.  Musculoskeletal: Negative for back pain and gait problem.  Skin: Negative for pallor and rash.  Neurological: Negative for dizziness, tremors, weakness, numbness and headaches.  Psychiatric/Behavioral: Negative for confusion and sleep disturbance.    Objective:  BP (!) 150/80 (BP Location: Left Arm, Patient Position: Sitting, Cuff Size: Normal)    Pulse 62    Temp 98.3 F (36.8 C) (Oral)    Ht 5\' 4"  (1.626 m)    Wt 125 lb (56.7 kg)    LMP 04/01/1983    SpO2 98%    BMI 21.46 kg/m   BP Readings from Last 3 Encounters:  09/08/19 (!) 150/80  03/10/19 126/76   09/07/18 126/70    Wt Readings from Last 3 Encounters:  09/08/19 125 lb (56.7 kg)  03/10/19 130 lb (59 kg)  09/07/18 127 lb (57.6 kg)    Physical Exam Constitutional:      General: She is not in acute distress.    Appearance: She is well-developed.  HENT:     Head: Normocephalic.     Right Ear: External ear normal.     Left Ear: External ear normal.     Nose: Nose normal.  Eyes:     General:        Right eye: No discharge.        Left eye: No discharge.     Conjunctiva/sclera: Conjunctivae normal.     Pupils: Pupils are equal, round, and reactive to light.  Neck:     Thyroid: No thyromegaly.     Vascular: No JVD.     Trachea: No tracheal deviation.  Cardiovascular:     Rate and Rhythm: Normal rate and regular rhythm.     Heart sounds: Normal heart sounds.  Pulmonary:     Effort: No respiratory distress.     Breath sounds: No stridor. No wheezing.  Abdominal:     General: Bowel sounds are normal. There is no distension.     Palpations: Abdomen is soft. There is no mass.     Tenderness: There is no  abdominal tenderness. There is no guarding or rebound.  Musculoskeletal:        General: No tenderness.     Cervical back: Normal range of motion and neck supple.  Lymphadenopathy:     Cervical: No cervical adenopathy.  Skin:    Findings: No erythema or rash.  Neurological:     Cranial Nerves: No cranial nerve deficit.     Motor: No abnormal muscle tone.     Coordination: Coordination normal.     Deep Tendon Reflexes: Reflexes normal.  Psychiatric:        Behavior: Behavior normal.        Thought Content: Thought content normal.        Judgment: Judgment normal.   LLE varic veins  Lab Results  Component Value Date   WBC 4.7 03/10/2019   HGB 11.6 (L) 03/10/2019   HCT 34.4 (L) 03/10/2019   PLT 227.0 03/10/2019   GLUCOSE 110 (H) 03/10/2019   CHOL 197 03/08/2018   TRIG 43.0 03/08/2018   HDL 91.40 03/08/2018   LDLDIRECT 104.3 06/07/2008   LDLCALC 97 03/08/2018    ALT 10 03/08/2018   AST 15 03/08/2018   NA 137 03/10/2019   K 4.4 03/10/2019   CL 101 03/10/2019   CREATININE 1.00 03/10/2019   BUN 24 (H) 03/10/2019   CO2 26 03/10/2019   TSH 0.91 09/07/2018   HGBA1C 5.7 03/10/2019    MM 3D SCREEN BREAST BILATERAL  Result Date: 05/05/2019 CLINICAL DATA:  Screening. History of LEFT breast cancer in 2011 status post breast conservation surgery. EXAM: DIGITAL SCREENING BILATERAL MAMMOGRAM WITH TOMO AND CAD COMPARISON:  Previous exam(s). ACR Breast Density Category c: The breast tissue is heterogeneously dense, which may obscure small masses. FINDINGS: There are no findings suspicious for malignancy. Images were processed with CAD. IMPRESSION: No mammographic evidence of malignancy. A result letter of this screening mammogram will be mailed directly to the patient. RECOMMENDATION: Screening mammogram in one year. (Code:SM-B-01Y) BI-RADS CATEGORY  1: Negative. Electronically Signed   By: Franki Cabot M.D.   On: 05/05/2019 08:54    Assessment & Plan:     Follow-up: No follow-ups on file.  Walker Kehr, MD

## 2019-09-08 NOTE — Assessment & Plan Note (Signed)
Vit B12 

## 2019-09-09 LAB — HEMOGLOBIN A1C: Hgb A1c MFr Bld: 5.8 % (ref 4.6–6.5)

## 2019-09-15 ENCOUNTER — Ambulatory Visit: Payer: Medicare Other | Admitting: Obstetrics & Gynecology

## 2019-09-27 NOTE — Progress Notes (Signed)
79 y.o. G2P2 Married White or Caucasian female here for annual exam.  Doing well.  Denies vaginal bleeding.    BMD 2020 showed t score -1.9 in femur.  This was stable from 08/2018.  FRAX score was 4.1%.  As T score was completely stable from 2015 to 2020, I do not feel she needs to be on treatment.  Has been checking BPs.  First thing in AM, blood pressures are 150 - 170/60 - 70 range.  After exercise, her blood pressure is substantially lower.  She has one BP after walking 2 miles that 98/59.  Typically, if checks later in the day, BP is lower as well.    Last hb 11.8.  Colonoscopy 2019 with Dr. Carlean Purl with 3 adenomatous polyps.  Patient's last menstrual period was 04/01/1983.          Sexually active: Yes.    The current method of family planning is status post hysterectomy.    Exercising: Yes.    walk & hiking Smoker:  no  Health Maintenance: Pap:  2012 neg History of abnormal Pap:  no MMG:  05-05-2019 bilateral category c density birads 1:neg, hx of breast cancer Colonoscopy:  2019, 3 adenomas, Dr. Carlean Purl BMD:   08-30-2018 mild osteopenia, f/u 78yrs TDaP:  2015 Pneumonia vaccine(s):  2015 Shingrix:   2018 Hep C testing: not done Screening Labs: done 03/10/2019 with Dr. Alain Marion   reports that she has never smoked. She has never used smokeless tobacco. She reports current alcohol use of about 5.0 standard drinks of alcohol per week. She reports that she does not use drugs.  Past Medical History:  Diagnosis Date  . Adenomatous colon polyp 2014  . Allergy   . Anemia   . Arthritis   . Breast cancer ([place]) 2011   DCIF  . Hypertension   . Osteopenia   . Personal history of radiation therapy   . Varicose veins     Past Surgical History:  Procedure Laterality Date  . ABDOMINAL HYSTERECTOMY  1985  . APPENDECTOMY  1956   pt states appendix was on left side not rt. per surgeon  . BREAST LUMPECTOMY Left 2011   left, reexcision 1/12  . CATARACT EXTRACTION, BILATERAL    .  COLONOSCOPY    . COLONOSCOPY W/ POLYPECTOMY  2014  . ENDOVENOUS ABLATION SAPHENOUS VEIN W/ LASER Left 01-27-2013   left greater saphenous vein and sclerotherapy left leg by Curt Jews MD  . ENDOVENOUS ABLATION SAPHENOUS VEIN W/ LASER Right 02-10-2013   right greater saphenous vein and sclerotherapy right leg by Curt Jews MD  . POLYPECTOMY    . ROTATOR CUFF REPAIR Right 2009  . SHOULDER SURGERY  2003   tear  . stab phlebectomy Left 07-21-2013   10-20 incisions left leg by Curt Jews MD  . TRICEPS TENDON REPAIR  2009    Current Outpatient Medications  Medication Sig Dispense Refill  . amLODipine (NORVASC) 2.5 MG tablet Take 1 tablet (2.5 mg total) by mouth daily. 90 tablet 3  . aspirin EC 81 MG tablet Take 1 tablet (81 mg total) by mouth daily. 100 tablet 3  . cyanocobalamin 1000 MCG tablet Take 1,000 mcg by mouth daily.    . irbesartan (AVAPRO) 150 MG tablet Take 1 tablet (150 mg total) by mouth daily. 90 tablet 3  . metronidazole (NORITATE) 1 % cream Apply topically 2 (two) times daily.     . Multiple Vitamins-Minerals (MULTIVITAMIN WITH MINERALS) tablet Take 1 tablet by mouth 3 (  three) times a week.     . NON FORMULARY [place] Retina po qd    . VITAMIN D PO Take 1,000 Int'l Units by mouth.     No current facility-administered medications for this visit.    Family History  Problem Relation Age of Onset  . Cancer Mother   . Hypertension Mother   . Other Mother        varicose veins  . Heart disease Father   . Diabetes Father   . Colon cancer Neg Hx   . Colon polyps Neg Hx   . Esophageal cancer Neg Hx   . Rectal cancer Neg Hx   . Stomach cancer Neg Hx     Review of Systems  Constitutional: Negative.   HENT: Negative.   Eyes: Negative.   Respiratory: Negative.   Cardiovascular: Negative.   Gastrointestinal: Negative.   Endocrine: Negative.   Genitourinary: Negative.   Musculoskeletal: Negative.   Skin: Negative.   Allergic/Immunologic: Negative.   Neurological:  Negative.   Hematological: Negative.   Psychiatric/Behavioral: Negative.     Exam:   BP 122/64   Pulse 68   Resp 16   Ht 5' 4.25" (1.632 m)   Wt 126 lb (57.2 kg)   LMP 04/01/1983   BMI 21.46 kg/m   Height: 5' 4.25" (163.2 cm)  General appearance: alert, cooperative and appears stated age Head: Normocephalic, without obvious abnormality, atraumatic Neck: no adenopathy, supple, symmetrical, trachea midline and thyroid normal to inspection and palpation Lungs: clear to auscultation bilaterally Breasts: normal appearance, no masses or tenderness, well healed incision around left areola with radiation changes that are stable Heart: regular rate and rhythm Abdomen: soft, non-tender; bowel sounds normal; no masses,  no organomegaly Extremities: extremities normal, atraumatic, no cyanosis or edema Skin: Skin color, texture, turgor normal. No rashes or lesions Lymph nodes: Cervical, supraclavicular, and axillary nodes normal. No abnormal inguinal nodes palpated Neurologic: Grossly normal   Pelvic: External genitalia:  no lesions              Urethra:  normal appearing urethra with no masses, tenderness or lesions              Bartholins and Skenes: normal                 Vagina: normal appearing vagina with normal color and discharge, no lesions              Cervix: absent              Pap taken: No. Bimanual Exam:  Uterus:  uterus absent              Adnexa: no mass, fullness, tenderness               Rectovaginal: Confirms               Anus:  normal sphincter tone, no lesions  Chaperone, Royal Hawthorn, CMA, was present for exam.  A:  Well Woman with normal exam PMP, no HRT H/o DCIS 12/11 s/p lumpectomy and radiation.  Released from oncology. Osteopenia with recent BMD 08/2018 H/o TAH Hypertension  P:   Mammogram guidelines reviewed.  Doing 3D MMG. pap smear not indicated Consider BMD again in 2-3 years Lab work done with Dr. Oletha Blend Colonoscopy not recommended in the  future Return annually or prn

## 2019-10-04 ENCOUNTER — Ambulatory Visit (INDEPENDENT_AMBULATORY_CARE_PROVIDER_SITE_OTHER): Payer: Medicare Other | Admitting: Obstetrics & Gynecology

## 2019-10-04 ENCOUNTER — Encounter: Payer: Self-pay | Admitting: Obstetrics & Gynecology

## 2019-10-04 ENCOUNTER — Other Ambulatory Visit: Payer: Self-pay

## 2019-10-04 VITALS — BP 122/64 | HR 68 | Resp 16 | Ht 64.25 in | Wt 126.0 lb

## 2019-10-04 DIAGNOSIS — Z01419 Encounter for gynecological examination (general) (routine) without abnormal findings: Secondary | ICD-10-CM

## 2019-10-31 ENCOUNTER — Other Ambulatory Visit: Payer: Self-pay | Admitting: Internal Medicine

## 2019-11-28 ENCOUNTER — Ambulatory Visit: Payer: Medicare Other | Admitting: Podiatry

## 2019-11-28 ENCOUNTER — Encounter: Payer: Self-pay | Admitting: Podiatry

## 2019-11-28 ENCOUNTER — Other Ambulatory Visit: Payer: Self-pay

## 2019-11-28 VITALS — BP 157/77 | HR 65 | Temp 97.1°F | Resp 16

## 2019-11-28 DIAGNOSIS — M79675 Pain in left toe(s): Secondary | ICD-10-CM

## 2019-11-28 DIAGNOSIS — L600 Ingrowing nail: Secondary | ICD-10-CM | POA: Diagnosis not present

## 2019-11-28 DIAGNOSIS — B351 Tinea unguium: Secondary | ICD-10-CM

## 2019-11-28 NOTE — Progress Notes (Signed)
Subjective:   Patient ID: Caroline Olson, female   DOB: 79 y.o.   MRN: [id number]   HPI Patient presents stating my right big toe has a splint on it and my left big toe has become very thickened and the nail makes it hard for me to wear shoe gear and I like to hike but it is becoming more difficult and I cannot cut it.  Patient does not smoke likes to be active   Review of Systems  All other systems reviewed and are negative.       Objective:  Physical Exam Vitals and nursing note reviewed.  Constitutional:      Appearance: She is well-developed.  Pulmonary:     Effort: Pulmonary effort is normal.  Musculoskeletal:        General: Normal range of motion.  Skin:    General: Skin is warm.  Neurological:     Mental Status: She is alert.     Neurovascular status intact muscle strength was found to be adequate range of motion adequate.  Patient does have good digital perfusion well oriented x3 with a damaged left hallux nail that is dystrophic and painful with pressure.  Patient has good digital perfusion well oriented x3 with a split in the right hallux nail distal but no deformity     Assessment:  Damage left hallux nail bed with pain that it has been present for years along with a small split in the right hallux nail     Plan:  H&P reviewed both conditions.  I recommended removal of the left nail and I do think this would be in her best interest and I discussed the procedure risk and patient wants procedure.  Today I went ahead and I educated her on this and she is going to reappoint as she is hiking on Wednesday when she has several days and today I did do deep debridement of the nailbed took off all the painful type tissue and created a smoother surface temporarily to try to help

## 2019-12-15 ENCOUNTER — Encounter: Payer: Self-pay | Admitting: Internal Medicine

## 2019-12-15 ENCOUNTER — Other Ambulatory Visit: Payer: Self-pay

## 2019-12-15 ENCOUNTER — Ambulatory Visit (INDEPENDENT_AMBULATORY_CARE_PROVIDER_SITE_OTHER): Payer: Medicare Other | Admitting: Internal Medicine

## 2019-12-15 VITALS — BP 160/72 | HR 59 | Temp 98.9°F | Ht 64.25 in | Wt 127.0 lb

## 2019-12-15 DIAGNOSIS — Z23 Encounter for immunization: Secondary | ICD-10-CM | POA: Diagnosis not present

## 2019-12-15 DIAGNOSIS — I10 Essential (primary) hypertension: Secondary | ICD-10-CM

## 2019-12-15 DIAGNOSIS — M79675 Pain in left toe(s): Secondary | ICD-10-CM

## 2019-12-15 DIAGNOSIS — M170 Bilateral primary osteoarthritis of knee: Secondary | ICD-10-CM | POA: Diagnosis not present

## 2019-12-15 DIAGNOSIS — M6740 Ganglion, unspecified site: Secondary | ICD-10-CM | POA: Diagnosis not present

## 2019-12-15 DIAGNOSIS — G8929 Other chronic pain: Secondary | ICD-10-CM | POA: Insufficient documentation

## 2019-12-15 NOTE — Addendum Note (Signed)
Addended by: Lauralee Evener C on: 12/15/2019 10:09 AM   Modules accepted: Orders

## 2019-12-15 NOTE — Progress Notes (Signed)
Subjective:  Patient ID: Caroline Olson, female    DOB: November 20, 1940  Age: 79 y.o. MRN: [id number]  CC: No chief complaint on file.   HPI Caroline Olson presents for HTN, thick toenail BP nl at home C/o lump on a palm - no pain   Outpatient Medications Prior to Visit  Medication Sig Dispense Refill  . amLODipine (NORVASC) 2.5 MG tablet Take 1 tablet (2.5 mg total) by mouth daily. 90 tablet 3  . aspirin EC 81 MG tablet Take 1 tablet (81 mg total) by mouth daily. 100 tablet 3  . cyanocobalamin 1000 MCG tablet Take 1,000 mcg by mouth daily.    . irbesartan (AVAPRO) 150 MG tablet TAKE 1 TABLET BY MOUTH DAILY 90 tablet 3  . metronidazole (NORITATE) 1 % cream Apply topically 2 (two) times daily.     . Multiple Vitamins-Minerals (MULTIVITAMIN WITH MINERALS) tablet Take 1 tablet by mouth 3 (three) times a week.     . NON FORMULARY [place] Retina po qd    . VITAMIN D PO Take 1,000 Int'l Units by mouth.     No facility-administered medications prior to visit.    ROS: Review of Systems  Constitutional: Negative for activity change, appetite change, chills, fatigue and unexpected weight change.  HENT: Negative for congestion, mouth sores and sinus pressure.   Eyes: Negative for visual disturbance.  Respiratory: Negative for cough and chest tightness.   Gastrointestinal: Negative for abdominal pain and nausea.  Genitourinary: Negative for difficulty urinating, frequency and vaginal pain.  Musculoskeletal: Negative for back pain and gait problem.  Skin: Negative for pallor and rash.  Neurological: Negative for dizziness, tremors, weakness, numbness and headaches.  Psychiatric/Behavioral: Negative for confusion and sleep disturbance.    Objective:  BP (!) 160/72 (BP Location: Right Arm, Patient Position: Sitting, Cuff Size: Normal)   Pulse (!) 59   Temp 98.9 F (37.2 C) (Oral)   Ht 5' 4.25" (1.632 m)   Wt 127 lb (57.6 kg)   LMP 04/01/1983   SpO2 98%   BMI 21.63 kg/m   BP Readings from  Last 3 Encounters:  12/15/19 (!) 160/72  11/28/19 (!) 157/77  10/04/19 122/64    Wt Readings from Last 3 Encounters:  12/15/19 127 lb (57.6 kg)  10/04/19 126 lb (57.2 kg)  09/08/19 125 lb (56.7 kg)    Physical Exam Constitutional:      General: She is not in acute distress.    Appearance: She is well-developed.  HENT:     Head: Normocephalic.     Right Ear: External ear normal.     Left Ear: External ear normal.     Nose: Nose normal.  Eyes:     General:        Right eye: No discharge.        Left eye: No discharge.     Conjunctiva/sclera: Conjunctivae normal.     Pupils: Pupils are equal, round, and reactive to light.  Neck:     Thyroid: No thyromegaly.     Vascular: No JVD.     Trachea: No tracheal deviation.  Cardiovascular:     Rate and Rhythm: Normal rate and regular rhythm.     Heart sounds: Normal heart sounds.  Pulmonary:     Effort: No respiratory distress.     Breath sounds: No stridor. No wheezing.  Abdominal:     General: Bowel sounds are normal. There is no distension.     Palpations: Abdomen is soft. There  is no mass.     Tenderness: There is no abdominal tenderness. There is no guarding or rebound.  Musculoskeletal:        General: No tenderness.     Cervical back: Normal range of motion and neck supple.  Lymphadenopathy:     Cervical: No cervical adenopathy.  Skin:    Findings: No erythema or rash.  Neurological:     Mental Status: She is oriented to person, place, and time.     Cranial Nerves: No cranial nerve deficit.     Motor: No abnormal muscle tone.     Coordination: Coordination normal.     Deep Tendon Reflexes: Reflexes normal.  Psychiatric:        Behavior: Behavior normal.        Thought Content: Thought content normal.        Judgment: Judgment normal.    L big toenail - thick L hand/pam - ganglion   Lab Results  Component Value Date   WBC 4.3 09/08/2019   HGB 11.8 (L) 09/08/2019   HCT 34.6 (L) 09/08/2019   PLT 228.0  09/08/2019   GLUCOSE 107 (H) 09/08/2019   CHOL 206 (H) 09/08/2019   TRIG 57.0 09/08/2019   HDL 86.40 09/08/2019   LDLDIRECT 104.3 06/07/2008   LDLCALC 109 (H) 09/08/2019   ALT 14 09/08/2019   AST 19 09/08/2019   NA 136 09/08/2019   K 4.7 09/08/2019   CL 101 09/08/2019   CREATININE 1.02 09/08/2019   BUN 23 09/08/2019   CO2 29 09/08/2019   TSH 0.91 09/07/2018   HGBA1C 5.8 09/08/2019    MM 3D SCREEN BREAST BILATERAL  Result Date: 05/05/2019 CLINICAL DATA:  Screening. History of LEFT breast cancer in 2011 status post breast conservation surgery. EXAM: DIGITAL SCREENING BILATERAL MAMMOGRAM WITH TOMO AND CAD COMPARISON:  Previous exam(s). ACR Breast Density Category c: The breast tissue is heterogeneously dense, which may obscure small masses. FINDINGS: There are no findings suspicious for malignancy. Images were processed with CAD. IMPRESSION: No mammographic evidence of malignancy. A result letter of this screening mammogram will be mailed directly to the patient. RECOMMENDATION: Screening mammogram in one year. (Code:SM-B-01Y) BI-RADS CATEGORY  1: Negative. Electronically Signed   By: Franki Cabot M.D.   On: 05/05/2019 08:54    Assessment & Plan:    Follow-up: No follow-ups on file.  Walker Kehr, MD

## 2019-12-15 NOTE — Assessment & Plan Note (Signed)
Thick L big toe toenail Dr Paulla Dolly will remove it in the fall

## 2019-12-15 NOTE — Assessment & Plan Note (Signed)
F/u w/dr Trudie Reed prn

## 2019-12-15 NOTE — Assessment & Plan Note (Addendum)
L hand/pam - ganglion -- small 9/21 Will watch Ortho ref if needed

## 2019-12-15 NOTE — Patient Instructions (Addendum)
[facility] started vaccine booster sign up. Please call [facility] at [phone]. You can also call the venue where you had your initial COVID 19 vaccination.   Ganglion Cyst  A ganglion cyst is a non-cancerous, fluid-filled lump that occurs near a joint or tendon. The cyst grows out of a joint or the lining of a tendon. Ganglion cysts most often develop in the hand or wrist, but they can also develop in the shoulder, elbow, hip, knee, ankle, or foot. Ganglion cysts are ball-shaped or egg-shaped. Their size can range from the size of a pea to larger than a grape. Increased activity may cause the cyst to get bigger because more fluid starts to build up. What are the causes? The exact cause of this condition is not known, but it may be related to:  Inflammation or irritation around the joint.  An injury.  Repetitive movements or overuse.  Arthritis. What increases the risk? You are more likely to develop this condition if:  You are a woman.  You are 28-65 years old. What are the signs or symptoms? The main symptom of this condition is a lump. It most often appears on the hand or wrist. In many cases, there are no other symptoms, but a cyst can sometimes cause:  Tingling.  Pain.  Numbness.  Muscle weakness.  Weak grip.  Less range of motion in a joint. How is this diagnosed? Ganglion cysts are usually diagnosed based on a physical exam. Your health care provider will feel the lump and may shine a light next to it. If it is a ganglion cyst, the light will likely shine through it. Your health care provider may order an X-ray, ultrasound, or MRI to rule out other conditions. How is this treated? Ganglion cysts often go away on their own without treatment. If you have pain or other symptoms, treatment may be needed. Treatment is also needed if the ganglion cyst limits your movement or if it gets infected. Treatment may include:  Wearing a brace or splint on your  wrist or finger.  Taking anti-inflammatory medicine.  Having fluid drained from the lump with a needle (aspiration).  Getting a steroid injected into the joint.  Having surgery to remove the ganglion cyst.  Placing a pad on your shoe or wearing shoes that will not rub against the cyst if it is on your foot. Follow these instructions at home:  Do not press on the ganglion cyst, poke it with a needle, or hit it.  Take over-the-counter and prescription medicines only as told by your health care provider.  If you have a brace or splint: ? Wear it as told by your health care provider. ? Remove it as told by your health care provider. Ask if you need to remove it when you take a shower or a bath.  Watch your ganglion cyst for any changes.  Keep all follow-up visits as told by your health care provider. This is important. Contact a health care provider if:  Your ganglion cyst becomes larger or more painful.  You have pus coming from the lump.  You have weakness or numbness in the affected area.  You have a fever or chills. Get help right away if:  You have a fever and have any of these in the cyst area: ? Increased redness. ? Red streaks. ? Swelling. Summary  A ganglion cyst is a non-cancerous, fluid-filled lump that occurs near a joint or tendon.  Ganglion cysts most often develop in  the hand or wrist, but they can also develop in the shoulder, elbow, hip, knee, ankle, or foot.  Ganglion cysts often go away on their own without treatment. This information is not intended to replace advice given to you by your health care provider. Make sure you discuss any questions you have with your health care provider. Document Revised: 02/27/2017 Document Reviewed: 11/14/2016 Elsevier Patient Education  [place].

## 2020-01-03 ENCOUNTER — Ambulatory Visit: Payer: Medicare Other | Attending: Internal Medicine

## 2020-01-03 DIAGNOSIS — Z23 Encounter for immunization: Secondary | ICD-10-CM

## 2020-01-03 NOTE — Progress Notes (Signed)
   Covid-19 Vaccination Clinic  Name:  Caroline Olson    MRN: [id number] DOB: 06-10-1940  01/03/2020  [name] was observed post Covid-19 immunization for 15 minutes without incident. She was provided with Vaccine Information Sheet and instruction to access the V-Safe system.   [name] was instructed to call [phone] with any severe reactions post vaccine: Marland Kitchen Difficulty breathing  . Swelling of face and throat  . A fast heartbeat  . A bad rash all over body  . Dizziness and weakness

## 2020-04-06 ENCOUNTER — Other Ambulatory Visit: Payer: Self-pay | Admitting: Internal Medicine

## 2020-04-06 DIAGNOSIS — Z1231 Encounter for screening mammogram for malignant neoplasm of breast: Secondary | ICD-10-CM

## 2020-05-21 ENCOUNTER — Inpatient Hospital Stay: Admission: RE | Admit: 2020-05-21 | Payer: Medicare Other | Source: Ambulatory Visit

## 2020-05-26 ENCOUNTER — Other Ambulatory Visit: Payer: Self-pay

## 2020-05-26 ENCOUNTER — Ambulatory Visit
Admission: RE | Admit: 2020-05-26 | Discharge: 2020-05-26 | Disposition: A | Discharge status: Home / Self Care | Payer: Medicare Other | Source: Ambulatory Visit | Attending: Internal Medicine | Admitting: Internal Medicine

## 2020-05-26 DIAGNOSIS — Z1231 Encounter for screening mammogram for malignant neoplasm of breast: Secondary | ICD-10-CM

## 2020-08-14 IMAGING — MG DIGITAL SCREENING BILAT W/ TOMO W/ CAD
8 series · 9 of 24 positions shown · non-contrast
Comparison: Previous exam(s).

CLINICAL DATA: Screening. History of LEFT breast cancer in 0844
status post breast conservation surgery.

EXAM:
DIGITAL SCREENING BILATERAL MAMMOGRAM WITH TOMO AND CAD

[R MLO synth-2D]
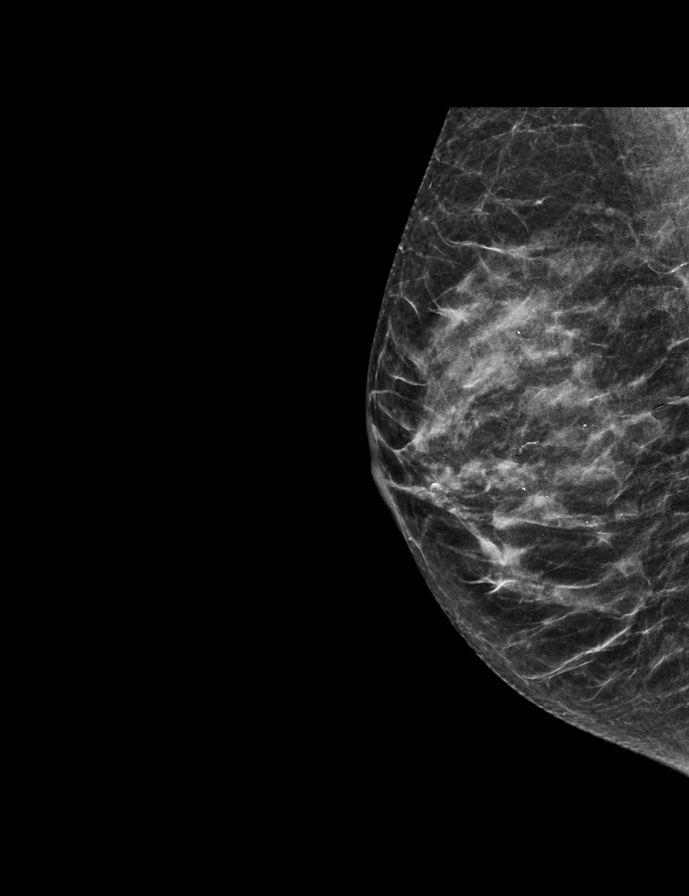

[L MLO synth-2D]
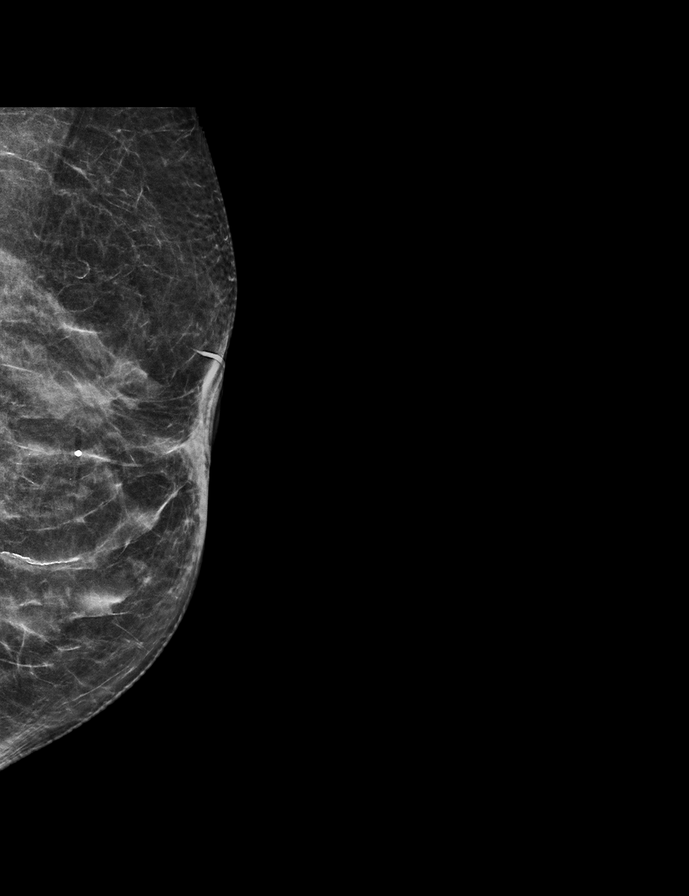

[R CC synth-2D]
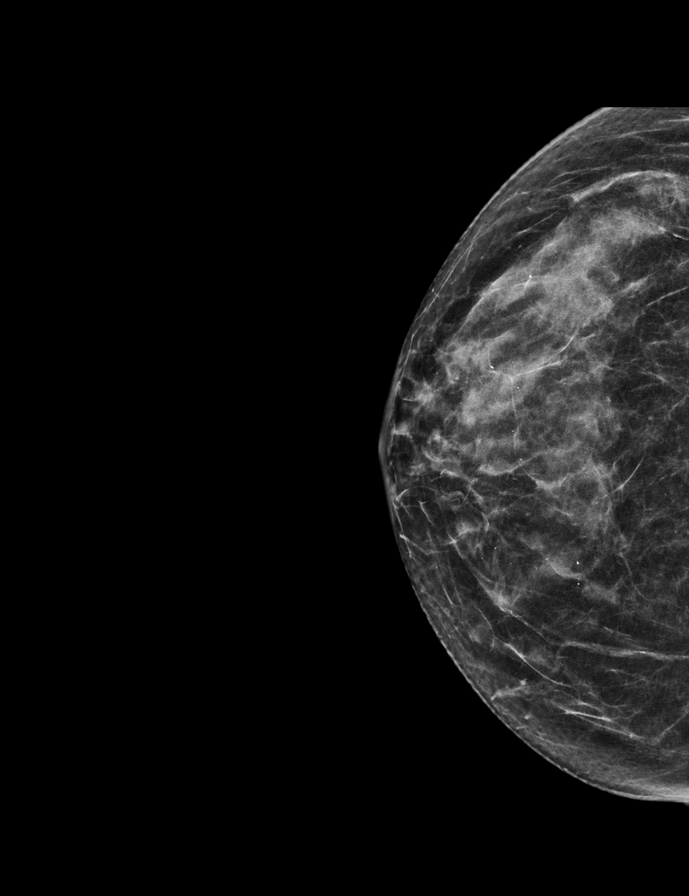

[L CC synth-2D]
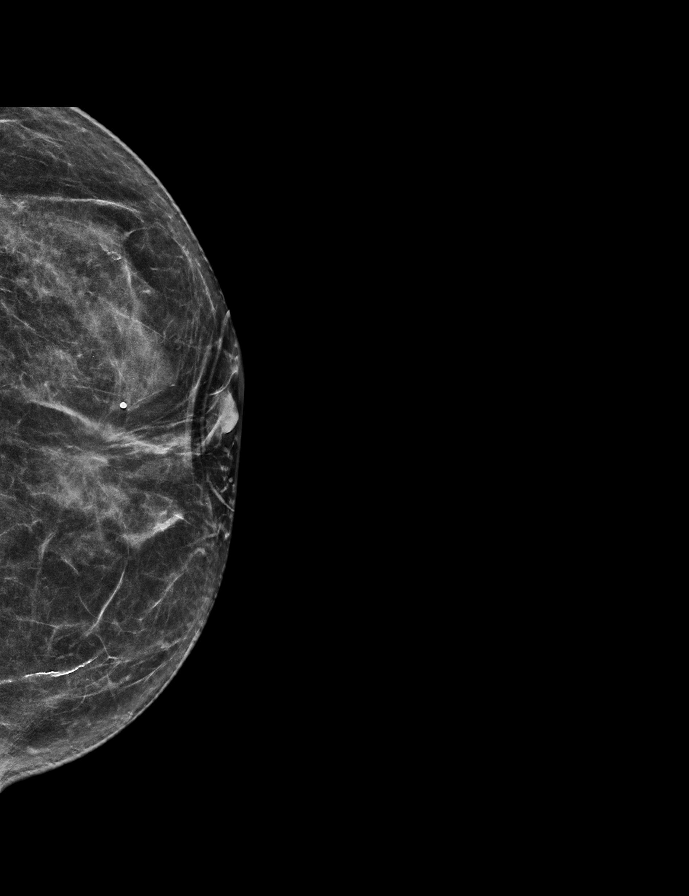

[L MLO tomo · 2 of 59 frames shown]
[frame 20/59]
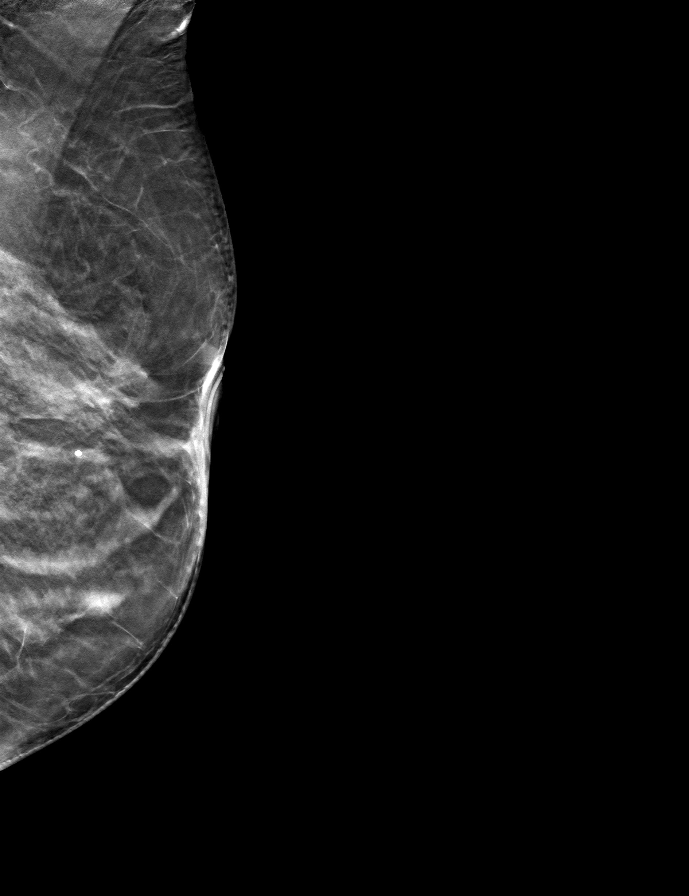
[frame 30/59]
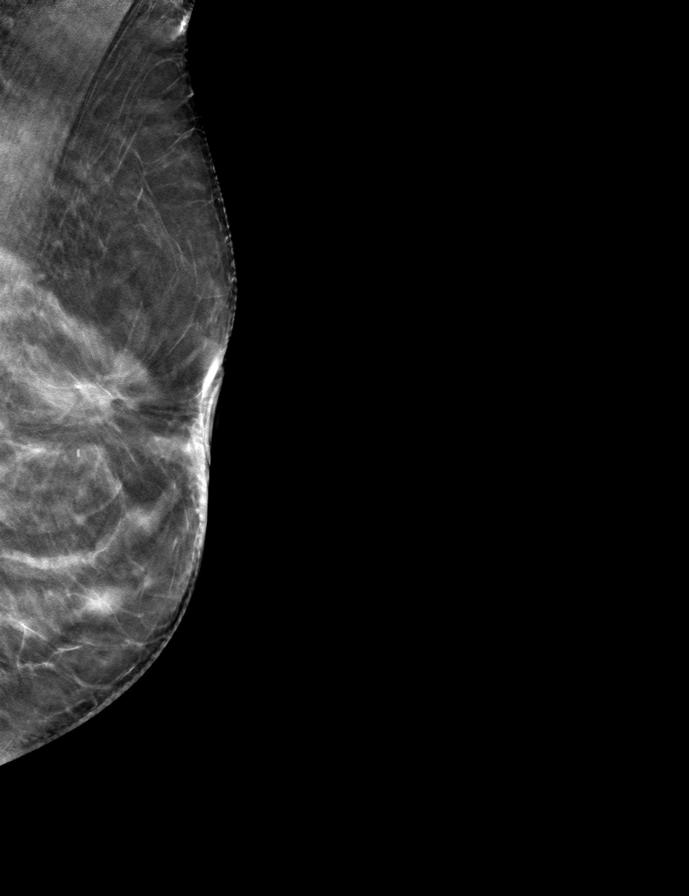

[R MLO tomo · tomo slice 25/50.0]
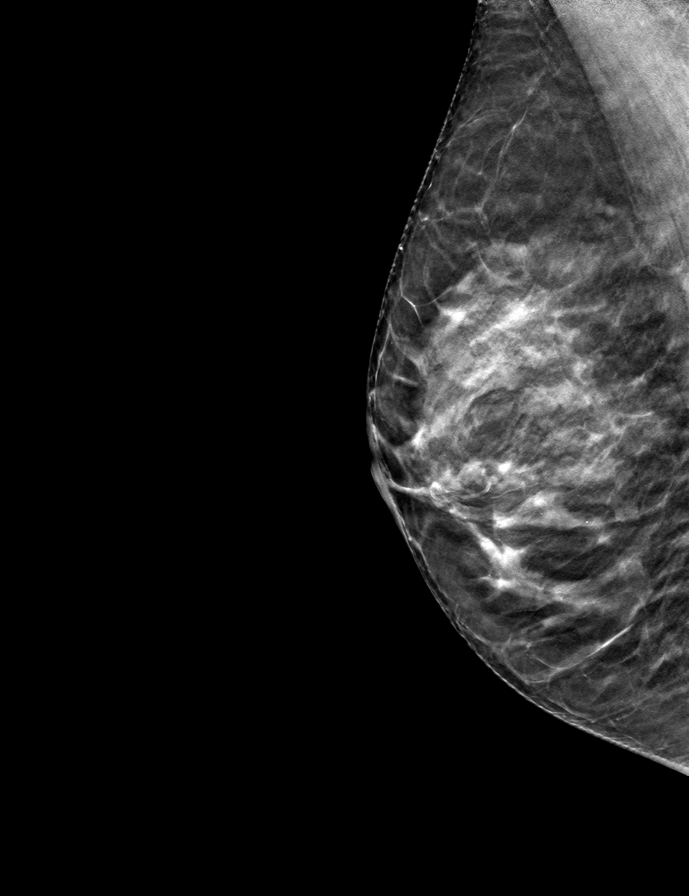

[L CC tomo · tomo slice 29/58.0]
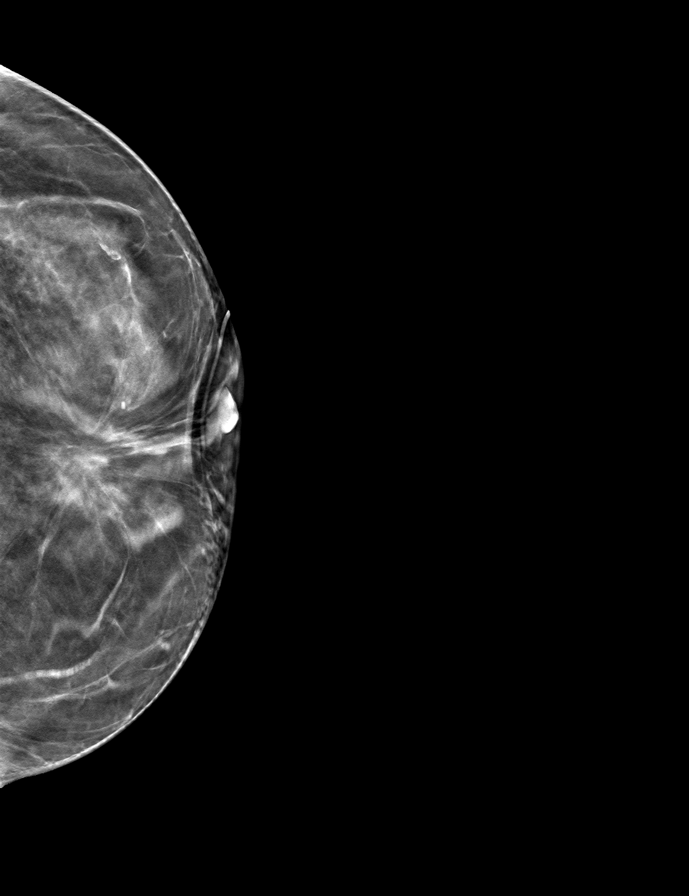

[R CC tomo · tomo slice 27/52.0]
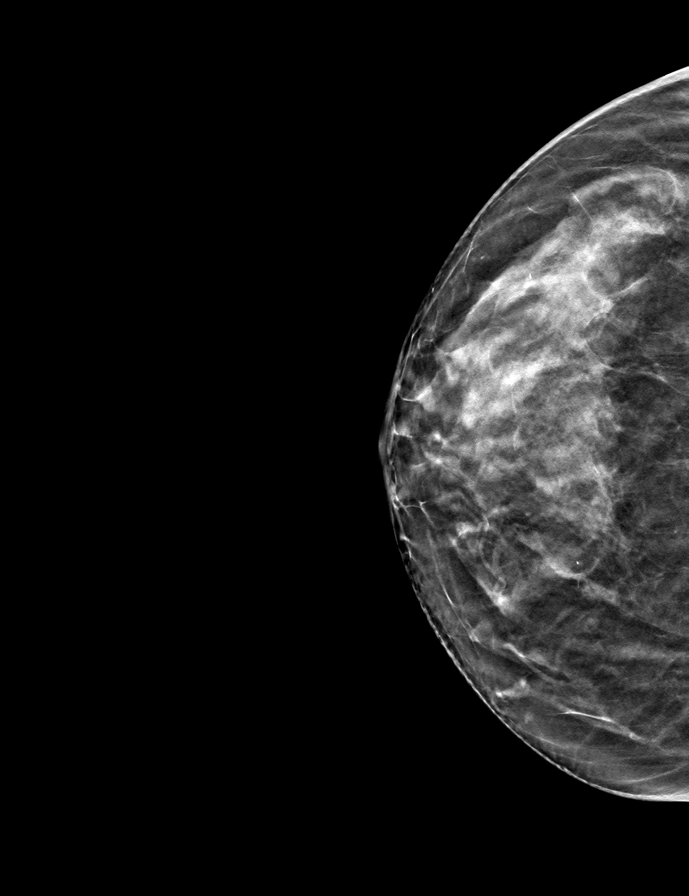

[9 of 24 positions shown; findings below may reference images not displayed]

ACR Breast Density Category c: The breast tissue is heterogeneously
dense, which may obscure small masses.
FINDINGS: There are no findings suspicious for malignancy. Images were
processed with CAD.
IMPRESSION: No mammographic evidence of malignancy. A result letter of this
screening mammogram will be mailed directly to the patient.

RECOMMENDATION:
Screening mammogram in one year. (Code:3K-5-35V)

BI-RADS CATEGORY  1: Negative.

## 2020-08-30 ENCOUNTER — Other Ambulatory Visit: Payer: Self-pay | Admitting: Internal Medicine

## 2020-09-13 ENCOUNTER — Encounter: Payer: Medicare Other | Admitting: Internal Medicine

## 2020-09-18 ENCOUNTER — Encounter: Payer: Self-pay | Admitting: Internal Medicine

## 2020-09-18 ENCOUNTER — Ambulatory Visit (INDEPENDENT_AMBULATORY_CARE_PROVIDER_SITE_OTHER): Payer: Medicare Other | Admitting: Internal Medicine

## 2020-09-18 ENCOUNTER — Other Ambulatory Visit: Payer: Self-pay

## 2020-09-18 VITALS — BP 148/70 | HR 64 | Temp 98.7°F | Ht 64.5 in | Wt 129.0 lb

## 2020-09-18 DIAGNOSIS — I10 Essential (primary) hypertension: Secondary | ICD-10-CM

## 2020-09-18 DIAGNOSIS — Z0000 Encounter for general adult medical examination without abnormal findings: Secondary | ICD-10-CM

## 2020-09-18 DIAGNOSIS — R7309 Other abnormal glucose: Secondary | ICD-10-CM | POA: Diagnosis not present

## 2020-09-18 DIAGNOSIS — I700 Atherosclerosis of aorta: Secondary | ICD-10-CM | POA: Insufficient documentation

## 2020-09-18 DIAGNOSIS — I83893 Varicose veins of bilateral lower extremities with other complications: Secondary | ICD-10-CM

## 2020-09-18 DIAGNOSIS — G250 Essential tremor: Secondary | ICD-10-CM | POA: Insufficient documentation

## 2020-09-18 DIAGNOSIS — I2510 Atherosclerotic heart disease of native coronary artery without angina pectoris: Secondary | ICD-10-CM | POA: Insufficient documentation

## 2020-09-18 DIAGNOSIS — I2583 Coronary atherosclerosis due to lipid rich plaque: Secondary | ICD-10-CM

## 2020-09-18 LAB — URINALYSIS
Bilirubin Urine: NEGATIVE
Hgb urine dipstick: NEGATIVE
Ketones, ur: NEGATIVE
Leukocytes,Ua: NEGATIVE
Nitrite: NEGATIVE
Specific Gravity, Urine: 1.02 (ref 1.000–1.030)
Total Protein, Urine: NEGATIVE
Urine Glucose: NEGATIVE
Urobilinogen, UA: 0.2 (ref 0.0–1.0)
pH: 6 (ref 5.0–8.0)

## 2020-09-18 LAB — CBC WITH DIFFERENTIAL/PLATELET
Basophils Absolute: 0 10*3/uL (ref 0.0–0.1)
Basophils Relative: 0.8 % (ref 0.0–3.0)
Eosinophils Absolute: 0.2 10*3/uL (ref 0.0–0.7)
Eosinophils Relative: 4.2 % (ref 0.0–5.0)
HCT: 34.9 % — ABNORMAL LOW (ref 36.0–46.0)
Hemoglobin: 12 g/dL (ref 12.0–15.0)
Lymphocytes Relative: 16.2 % (ref 12.0–46.0)
Lymphs Abs: 0.9 10*3/uL (ref 0.7–4.0)
MCHC: 34.3 g/dL (ref 30.0–36.0)
MCV: 90.2 fl (ref 78.0–100.0)
Monocytes Absolute: 0.5 10*3/uL (ref 0.1–1.0)
Monocytes Relative: 8.9 % (ref 3.0–12.0)
Neutro Abs: 3.7 10*3/uL (ref 1.4–7.7)
Neutrophils Relative %: 69.9 % (ref 43.0–77.0)
Platelets: 255 10*3/uL (ref 150.0–400.0)
RBC: 3.87 Mil/uL (ref 3.87–5.11)
RDW: 12.8 % (ref 11.5–15.5)
WBC: 5.3 10*3/uL (ref 4.0–10.5)

## 2020-09-18 LAB — COMPREHENSIVE METABOLIC PANEL
ALT: 16 U/L (ref 0–35)
AST: 19 U/L (ref 0–37)
Albumin: 4.4 g/dL (ref 3.5–5.2)
Alkaline Phosphatase: 80 U/L (ref 39–117)
BUN: 21 mg/dL (ref 6–23)
CO2: 28 mEq/L (ref 19–32)
Calcium: 10.1 mg/dL (ref 8.4–10.5)
Chloride: 103 mEq/L (ref 96–112)
Creatinine, Ser: 1.05 mg/dL (ref 0.40–1.20)
GFR: 49.73 mL/min — ABNORMAL LOW (ref >60.00)
Glucose, Bld: 101 mg/dL — ABNORMAL HIGH (ref 70–99)
Potassium: 5.1 mEq/L (ref 3.5–5.1)
Sodium: 138 mEq/L (ref 135–145)
Total Bilirubin: 0.6 mg/dL (ref 0.2–1.2)
Total Protein: 7.3 g/dL (ref 6.0–8.3)

## 2020-09-18 LAB — LIPID PANEL
Cholesterol: 202 mg/dL — ABNORMAL HIGH (ref 0–200)
HDL: 80.2 mg/dL (ref >39.00)
LDL Cholesterol: 109 mg/dL — ABNORMAL HIGH (ref 0–99)
NonHDL: 122.09
Total CHOL/HDL Ratio: 3
Triglycerides: 66 mg/dL (ref 0.0–149.0)
VLDL: 13.2 mg/dL (ref 0.0–40.0)

## 2020-09-18 LAB — HEMOGLOBIN A1C: Hgb A1c MFr Bld: 6 % (ref 4.6–6.5)

## 2020-09-18 LAB — TSH: TSH: 0.85 u[IU]/mL (ref 0.35–4.50)

## 2020-09-18 MED ORDER — ASPIRIN EC 81 MG PO TBEC: 81.0000 mg | DELAYED_RELEASE_TABLET | Freq: Every day | ORAL | 3 refills | Status: AC

## 2020-09-18 MED ORDER — RED YEAST RICE EXTRACT 300 MG PO CAPS: 1.0000 | ORAL_CAPSULE | Freq: Every day | ORAL | 3 refills | Status: AC

## 2020-09-18 NOTE — Assessment & Plan Note (Signed)
Avapro, Norvasc BP nl at home

## 2020-09-18 NOTE — Assessment & Plan Note (Signed)

## 2020-09-18 NOTE — Assessment & Plan Note (Addendum)
We discussed treatment options.  Start red rice yeast extract Pt declined statins On  ASA

## 2020-09-18 NOTE — Progress Notes (Signed)
Subjective:  Patient ID: Caroline Olson, female    DOB: 09-10-1940  Age: 80 y.o. MRN: [id number]  CC: Annual Exam   HPI Caroline Olson presents for a well exam The patient is worried about episodic mild hand tremor.  Her older sister has essential tremor and is being treated with probably propranolol.  Outpatient Medications Prior to Visit  Medication Sig Dispense Refill   amLODipine (NORVASC) 2.5 MG tablet TAKE 1 TABLET BY MOUTH DAILY 90 tablet 3   cyanocobalamin 1000 MCG tablet Take 1,000 mcg by mouth daily.     irbesartan (AVAPRO) 150 MG tablet TAKE 1 TABLET BY MOUTH DAILY 90 tablet 3   metronidazole (NORITATE) 1 % cream Apply topically 2 (two) times daily.      Multiple Vitamins-Minerals (MULTIVITAMIN WITH MINERALS) tablet Take 1 tablet by mouth 3 (three) times a week.      VITAMIN D PO Take 1,000 Int'l Units by mouth.     NON FORMULARY [place] Retina po qd (Patient not taking: Reported on 09/18/2020)     No facility-administered medications prior to visit.    ROS: Review of Systems  Constitutional:  Negative for activity change, appetite change, chills, fatigue and unexpected weight change.  HENT:  Negative for congestion, mouth sores and sinus pressure.   Eyes:  Negative for visual disturbance.  Respiratory:  Negative for cough and chest tightness.   Gastrointestinal:  Negative for abdominal pain and nausea.  Genitourinary:  Negative for difficulty urinating, frequency and vaginal pain.  Musculoskeletal:  Negative for back pain and gait problem.  Skin:  Negative for pallor and rash.  Neurological:  Positive for tremors. Negative for dizziness, weakness, numbness and headaches.  Psychiatric/Behavioral:  Negative for confusion and sleep disturbance.    Objective:  BP (!) 148/70 (BP Location: Left Arm)   Pulse 64   Temp 98.7 F (37.1 C) (Oral)   Ht 5' 4.5" (1.638 m)   Wt 129 lb (58.5 kg)   LMP 04/01/1983   SpO2 98%   BMI 21.80 kg/m   BP Readings from Last 3 Encounters:   09/18/20 (!) 148/70  12/15/19 (!) 160/72  11/28/19 (!) 157/77    Wt Readings from Last 3 Encounters:  09/18/20 129 lb (58.5 kg)  12/15/19 127 lb (57.6 kg)  10/04/19 126 lb (57.2 kg)    Physical Exam Constitutional:      General: She is not in acute distress.    Appearance: She is well-developed.  HENT:     Head: Normocephalic.     Right Ear: External ear normal.     Left Ear: External ear normal.     Nose: Nose normal.  Eyes:     General:        Right eye: No discharge.        Left eye: No discharge.     Conjunctiva/sclera: Conjunctivae normal.     Pupils: Pupils are equal, round, and reactive to light.  Neck:     Thyroid: No thyromegaly.     Vascular: No JVD.     Trachea: No tracheal deviation.  Cardiovascular:     Rate and Rhythm: Normal rate and regular rhythm.     Heart sounds: Normal heart sounds.  Pulmonary:     Effort: No respiratory distress.     Breath sounds: No stridor. No wheezing.  Abdominal:     General: Bowel sounds are normal. There is no distension.     Palpations: Abdomen is soft. There is no mass.  Tenderness: There is no abdominal tenderness. There is no guarding or rebound.  Musculoskeletal:        General: No tenderness.     Cervical back: Normal range of motion and neck supple. No rigidity.  Lymphadenopathy:     Cervical: No cervical adenopathy.  Skin:    Findings: No erythema or rash.  Neurological:     Cranial Nerves: No cranial nerve deficit.     Motor: No abnormal muscle tone.     Coordination: Coordination normal.     Deep Tendon Reflexes: Reflexes normal.  Psychiatric:        Behavior: Behavior normal.        Thought Content: Thought content normal.        Judgment: Judgment normal.   No tremor  Lab Results  Component Value Date   WBC 4.3 09/08/2019   HGB 11.8 (L) 09/08/2019   HCT 34.6 (L) 09/08/2019   PLT 228.0 09/08/2019   GLUCOSE 107 (H) 09/08/2019   CHOL 206 (H) 09/08/2019   TRIG 57.0 09/08/2019   HDL 86.40  09/08/2019   LDLDIRECT 104.3 06/07/2008   LDLCALC 109 (H) 09/08/2019   ALT 14 09/08/2019   AST 19 09/08/2019   NA 136 09/08/2019   K 4.7 09/08/2019   CL 101 09/08/2019   CREATININE 1.02 09/08/2019   BUN 23 09/08/2019   CO2 29 09/08/2019   TSH 0.91 09/07/2018   HGBA1C 5.8 09/08/2019    MM 3D SCREEN BREAST BILATERAL  Result Date: 05/29/2020 CLINICAL DATA:  Screening. EXAM: DIGITAL SCREENING BILATERAL MAMMOGRAM WITH TOMOSYNTHESIS AND CAD TECHNIQUE: Bilateral screening digital craniocaudal and mediolateral oblique mammograms were obtained. Bilateral screening digital breast tomosynthesis was performed. The images were evaluated with computer-aided detection. COMPARISON:  Previous exam(s). ACR Breast Density Category c: The breast tissue is heterogeneously dense, which may obscure small masses. FINDINGS: There are no findings suspicious for malignancy. The images were evaluated with computer-aided detection. IMPRESSION: No mammographic evidence of malignancy. A result letter of this screening mammogram will be mailed directly to the patient. RECOMMENDATION: Screening mammogram in one year. (Code:SM-B-01Y) BI-RADS CATEGORY  1: Negative. Electronically Signed   By: Lajean Manes M.D.   On: 05/29/2020 12:59    Assessment & Plan:     Walker Kehr, MD

## 2020-09-18 NOTE — Patient Instructions (Signed)
You can try Lion's Mane Mushroom capsules for memory problems, decreased focus, mental fog, neuropathy ([email or web address])   

## 2020-09-18 NOTE — Assessment & Plan Note (Signed)
Red rice yeast extract Pt declined statins

## 2020-09-18 NOTE — Assessment & Plan Note (Addendum)
New - practically asymptomatic. Sister on Propranolol We can try lions mane

## 2020-09-19 NOTE — Assessment & Plan Note (Signed)
The patient declined to have a surgical treatment option.  Continue with baby aspirin.  She can use compression socks

## 2020-10-31 ENCOUNTER — Other Ambulatory Visit: Payer: Self-pay | Admitting: Internal Medicine

## 2020-11-29 ENCOUNTER — Ambulatory Visit: Payer: Medicare Other | Admitting: Obstetrics & Gynecology

## 2020-12-12 ENCOUNTER — Telehealth: Payer: Self-pay | Admitting: Internal Medicine

## 2020-12-12 NOTE — Telephone Encounter (Signed)
Left message for patient to call me back at [phone] to schedule Medicare Annual Wellness Visit   Last AWV  09/08/19  Please schedule at anytime with [facility] if patient calls the office back.    40 Minutes appointment   Any questions, please call me at [phone]

## 2020-12-13 ENCOUNTER — Telehealth: Payer: Self-pay | Admitting: Internal Medicine

## 2020-12-13 NOTE — Telephone Encounter (Signed)
Left message for patient to call me back at [phone] to schedule Medicare Annual Wellness Visit   Last AWV  09/08/19  Please schedule at anytime with [facility] if patient calls the office back.    40 Minutes appointment   Any questions, please call me at [phone]

## 2020-12-25 ENCOUNTER — Ambulatory Visit (INDEPENDENT_AMBULATORY_CARE_PROVIDER_SITE_OTHER): Payer: Medicare Other

## 2020-12-25 ENCOUNTER — Encounter: Payer: Self-pay | Admitting: Internal Medicine

## 2020-12-25 ENCOUNTER — Other Ambulatory Visit: Payer: Self-pay

## 2020-12-25 VITALS — BP 130/70 | HR 65 | Temp 97.8°F | Ht 65.0 in | Wt 128.6 lb

## 2020-12-25 DIAGNOSIS — Z0000 Encounter for general adult medical examination without abnormal findings: Secondary | ICD-10-CM

## 2020-12-25 DIAGNOSIS — Z23 Encounter for immunization: Secondary | ICD-10-CM

## 2020-12-25 NOTE — Patient Instructions (Signed)
Caroline Olson , Thank you for taking time to come for your Medicare Wellness Visit. I appreciate your ongoing commitment to your health goals. Please review the following plan we discussed and let me know if I can assist you in the future.   Screening recommendations/referrals: Colonoscopy: Not a candidate for screening due to age 80: 05/26/2020; due every year Bone Density: 08/30/2018; due every 2-3 years Recommended yearly ophthalmology/optometry visit for glaucoma screening and checkup Recommended yearly dental visit for hygiene and checkup  Vaccinations: Influenza vaccine: 12/25/2020 Pneumococcal vaccine: 07/08/2009, 01/25/2014 Tdap vaccine: 06/30/2013; due every 10 years Shingles vaccine: 11/11/2016, 01/01/2017   Covid-19: 05/08/2019, 06/07/2019, 01/03/2020  Advanced directives: Yes; documents on file.  Conditions/risks identified: Yes; Client understands the importance of follow-up with providers by attending scheduled visits and discussed goals to eat healthier, increase physical activity, exercise the brain, socialize more, get enough sleep and make time for laughter.  Next appointment: Please schedule your next Medicare Wellness Visit with your Nurse Health Advisor in 1 year by calling [phone].   Preventive Care 43 Years and Older, Female Preventive care refers to lifestyle choices and visits with your health care provider that can promote health and wellness. What does preventive care include? A yearly physical exam. This is also called an annual well check. Dental exams once or twice a year. Routine eye exams. Ask your health care provider how often you should have your eyes checked. Personal lifestyle choices, including: Daily care of your teeth and gums. Regular physical activity. Eating a healthy diet. Avoiding tobacco and drug use. Limiting alcohol use. Practicing safe sex. Taking low-dose aspirin every day. Taking vitamin and mineral supplements as recommended by  your health care provider. What happens during an annual well check? The services and screenings done by your health care provider during your annual well check will depend on your age, overall health, lifestyle risk factors, and family history of disease. Counseling  Your health care provider may ask you questions about your: Alcohol use. Tobacco use. Drug use. Emotional well-being. Home and relationship well-being. Sexual activity. Eating habits. History of falls. Memory and ability to understand (cognition). Work and work Statistician. Reproductive health. Screening  You may have the following tests or measurements: Height, weight, and BMI. Blood pressure. Lipid and cholesterol levels. These may be checked every 5 years, or more frequently if you are over 47 years old. Skin check. Lung cancer screening. You may have this screening every year starting at age 15 if you have a 30-pack-year history of smoking and currently smoke or have quit within the past 15 years. Fecal occult blood test (FOBT) of the stool. You may have this test every year starting at age 28. Flexible sigmoidoscopy or colonoscopy. You may have a sigmoidoscopy every 5 years or a colonoscopy every 10 years starting at age 30. Hepatitis C blood test. Hepatitis B blood test. Sexually transmitted disease (STD) testing. Diabetes screening. This is done by checking your blood sugar (glucose) after you have not eaten for a while (fasting). You may have this done every 1-3 years. Bone density scan. This is done to screen for osteoporosis. You may have this done starting at age 80. Mammogram. This may be done every 1-2 years. Talk to your health care provider about how often you should have regular mammograms. Talk with your health care provider about your test results, treatment options, and if necessary, the need for more tests. Vaccines  Your health care provider may recommend certain vaccines, such as: Influenza  vaccine. This is recommended every year. Tetanus, diphtheria, and acellular pertussis (Tdap, Td) vaccine. You may need a Td booster every 10 years. Zoster vaccine. You may need this after age 24. Pneumococcal 13-valent conjugate (PCV13) vaccine. One dose is recommended after age 72. Pneumococcal polysaccharide (PPSV23) vaccine. One dose is recommended after age 64. Talk to your health care provider about which screenings and vaccines you need and how often you need them. This information is not intended to replace advice given to you by your health care provider. Make sure you discuss any questions you have with your health care provider. Document Released: 04/13/2015 Document Revised: 12/05/2015 Document Reviewed: 01/16/2015 Elsevier Interactive Patient Education  2017 [place] Prevention in the Home Falls can cause injuries. They can happen to people of all ages. There are many things you can do to make your home safe and to help prevent falls. What can I do on the outside of my home? Regularly fix the edges of walkways and driveways and fix any cracks. Remove anything that might make you trip as you walk through a door, such as a raised step or threshold. Trim any bushes or trees on the path to your home. Use bright outdoor lighting. Clear any walking paths of anything that might make someone trip, such as rocks or tools. Regularly check to see if handrails are loose or broken. Make sure that both sides of any steps have handrails. Any raised decks and porches should have guardrails on the edges. Have any leaves, snow, or ice cleared regularly. Use sand or salt on walking paths during winter. Clean up any spills in your garage right away. This includes oil or grease spills. What can I do in the bathroom? Use night lights. Install grab bars by the toilet and in the tub and shower. Do not use towel bars as grab bars. Use non-skid mats or decals in the tub or shower. If you  need to sit down in the shower, use a plastic, non-slip stool. Keep the floor dry. Clean up any water that spills on the floor as soon as it happens. Remove soap buildup in the tub or shower regularly. Attach bath mats securely with double-sided non-slip rug tape. Do not have throw rugs and other things on the floor that can make you trip. What can I do in the bedroom? Use night lights. Make sure that you have a light by your bed that is easy to reach. Do not use any sheets or blankets that are too big for your bed. They should not hang down onto the floor. Have a firm chair that has side arms. You can use this for support while you get dressed. Do not have throw rugs and other things on the floor that can make you trip. What can I do in the kitchen? Clean up any spills right away. Avoid walking on wet floors. Keep items that you use a lot in easy-to-reach places. If you need to reach something above you, use a strong step stool that has a grab bar. Keep electrical cords out of the way. Do not use floor polish or wax that makes floors slippery. If you must use wax, use non-skid floor wax. Do not have throw rugs and other things on the floor that can make you trip. What can I do with my stairs? Do not leave any items on the stairs. Make sure that there are handrails on both sides of the stairs and use them. Fix handrails  that are broken or loose. Make sure that handrails are as long as the stairways. Check any carpeting to make sure that it is firmly attached to the stairs. Fix any carpet that is loose or worn. Avoid having throw rugs at the top or bottom of the stairs. If you do have throw rugs, attach them to the floor with carpet tape. Make sure that you have a light switch at the top of the stairs and the bottom of the stairs. If you do not have them, ask someone to add them for you. What else can I do to help prevent falls? Wear shoes that: Do not have high heels. Have rubber  bottoms. Are comfortable and fit you well. Are closed at the toe. Do not wear sandals. If you use a stepladder: Make sure that it is fully opened. Do not climb a closed stepladder. Make sure that both sides of the stepladder are locked into place. Ask someone to hold it for you, if possible. Clearly mark and make sure that you can see: Any grab bars or handrails. First and last steps. Where the edge of each step is. Use tools that help you move around (mobility aids) if they are needed. These include: Canes. Walkers. Scooters. Crutches. Turn on the lights when you go into a dark area. Replace any light bulbs as soon as they burn out. Set up your furniture so you have a clear path. Avoid moving your furniture around. If any of your floors are uneven, fix them. If there are any pets around you, be aware of where they are. Review your medicines with your doctor. Some medicines can make you feel dizzy. This can increase your chance of falling. Ask your doctor what other things that you can do to help prevent falls. This information is not intended to replace advice given to you by your health care provider. Make sure you discuss any questions you have with your health care provider. Document Released: 01/11/2009 Document Revised: 08/23/2015 Document Reviewed: 04/21/2014 Elsevier Interactive Patient Education  2017 [employer].

## 2020-12-25 NOTE — Progress Notes (Addendum)
Subjective:   Caroline Olson is a 80 y.o. female who presents for Medicare Annual (Subsequent) preventive examination.  Review of Systems     Cardiac Risk Factors include: advanced age (>53men, >47 women);hypertension;family history of premature cardiovascular disease     Objective:    Today's Vitals   12/25/20 1121  BP: 130/70  Pulse: 65  Temp: 97.8 F (36.6 C)  SpO2: 98%  Weight: 128 lb 9.6 oz (58.3 kg)  Height: 5\' 5"  (1.651 m)  PainSc: 0-No pain   Body mass index is 21.4 kg/m.  Advanced Directives 12/25/2020 09/08/2019 08/31/2018 08/17/2017 08/04/2016 02/05/2015  Does Patient Have a Medical Advance Directive? Yes Yes Yes Yes Yes Yes  Type of Advance Directive - Living will;Healthcare Power of [place];Living will [place];Living will [place];Living will -  Does patient want to make changes to medical advance directive? No - Patient declined No - Patient declined - - Yes (ED - Information included in AVS) -  Copy of [place] in Chart? - - No - copy requested No - copy requested Yes Yes    Current Medications (verified) Outpatient Encounter Medications as of 12/25/2020  Medication Sig   amLODipine (NORVASC) 2.5 MG tablet TAKE 1 TABLET BY MOUTH DAILY   aspirin EC 81 MG tablet Take 1 tablet (81 mg total) by mouth daily.   cyanocobalamin 1000 MCG tablet Take 1,000 mcg by mouth daily.   irbesartan (AVAPRO) 150 MG tablet TAKE 1 TABLET BY MOUTH DAILY   metronidazole (NORITATE) 1 % cream Apply topically 2 (two) times daily.    Multiple Vitamins-Minerals (MULTIVITAMIN WITH MINERALS) tablet Take 1 tablet by mouth 3 (three) times a week.    Red Yeast Rice Extract 300 MG CAPS Take 1 capsule by mouth daily.   VITAMIN D PO Take 1,000 Int'l Units by mouth.   No facility-administered encounter medications on file as of 12/25/2020.    Allergies (verified) Aspirin and Codeine sulfate   History: Past  Medical History:  Diagnosis Date   Adenomatous colon polyp 2014   Allergy    Anemia    Arthritis    Breast cancer ([place]) 2011   DCIF   Hypertension    Osteopenia    Personal history of radiation therapy    Varicose veins    Past Surgical History:  Procedure Laterality Date   [place]   pt states appendix was on left side not rt. per surgeon   BREAST LUMPECTOMY Left 2011   left, reexcision 1/12   CATARACT EXTRACTION, BILATERAL     COLONOSCOPY     COLONOSCOPY W/ POLYPECTOMY  2014   ENDOVENOUS ABLATION SAPHENOUS VEIN W/ LASER Left 01-27-2013   left greater saphenous vein and sclerotherapy left leg by Curt Jews MD   ENDOVENOUS ABLATION SAPHENOUS VEIN W/ LASER Right 02-10-2013   right greater saphenous vein and sclerotherapy right leg by Curt Jews MD   POLYPECTOMY     ROTATOR CUFF REPAIR Right 2009   SHOULDER SURGERY  2003   tear   stab phlebectomy Left 07-21-2013   10-20 incisions left leg by Curt Jews MD   TRICEPS TENDON REPAIR  2009   Family History  Problem Relation Age of Onset   Cancer Mother    Hypertension Mother    Other Mother        varicose veins   Heart disease Father    Diabetes  Father    Breast cancer Maternal Aunt    Colon cancer Neg Hx    Colon polyps Neg Hx    Esophageal cancer Neg Hx    Rectal cancer Neg Hx    Stomach cancer Neg Hx    Social History   Socioeconomic History   Marital status: Married    Spouse name: Not on file   Number of children: 2   Years of education: Not on file   Highest education level: Not on file  Occupational History   Occupation: retired   Tobacco Use   Smoking status: Never   Smokeless tobacco: Never  Vaping Use   Vaping Use: Never used  Substance and Sexual Activity   Alcohol use: Yes    Alcohol/week: 5.0 standard drinks    Types: 5 Glasses of wine per week   Drug use: No   Sexual activity: Yes    Partners: Male    Birth control/protection: Surgical     Comment: TAH/BSO  Other Topics Concern   Not on file  Social History Narrative   Patient is married she is retired and has 2 daughters   Some alcohol use no tobacco or drug use   Social Determinants of Radio broadcast assistant Strain: Low Risk    Difficulty of Paying Living Expenses: Not hard at all  Food Insecurity: No Food Insecurity   Worried About Charity fundraiser in the Last Year: Never true   Arboriculturist in the Last Year: Never true  Transportation Needs: No Transportation Needs   Lack of Transportation (Medical): No   Lack of Transportation (Non-Medical): No  Physical Activity: Sufficiently Active   Days of Exercise per Week: 5 days   Minutes of Exercise per Session: 30 min  Stress: No Stress Concern Present   Feeling of Stress : Not at all  Social Connections: Socially Integrated   Frequency of Communication with Friends and Family: More than three times a week   Frequency of Social Gatherings with Friends and Family: More than three times a week   Attends Religious Services: More than 4 times per year   Active Member of [employer] or Organizations: Yes   Attends Music therapist: More than 4 times per year   Marital Status: Married    Tobacco Counseling Counseling given: Not Answered   Clinical Intake:  Pre-visit preparation completed: Yes  Pain : No/denies pain Pain Score: 0-No pain     BMI - recorded: 21.4 Nutritional Status: BMI of 19-24  Normal Nutritional Risks: None Diabetes: No  How often do you need to have someone help you when you read instructions, pamphlets, or other written materials from your doctor or pharmacy?: 1 - Never 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 no  Interpreter Needed?: No  Information entered by :: Lisette Abu, LPN   Activities of Daily Living In your present state of health, do you have any difficulty performing the following activities: 12/25/2020   Hearing? N  Vision? N  Difficulty concentrating or making decisions? N  Walking or climbing stairs? N  Dressing or bathing? N  Doing errands, shopping? N  Preparing Food and eating ? N  Using the Toilet? N  In the past six months, have you accidently leaked urine? N  Do you have problems with loss of bowel control? N  Managing your Medications? N  Managing your Finances? N  Housekeeping or managing your Housekeeping?  N  Some recent data might be hidden    Patient Care Team: Plotnikov, Evie Lacks, MD as PCP - General Magrinat, Virgie Dad, MD (Hematology and Oncology) Coralie Keens, MD (General Surgery) Megan Salon, MD as Attending Physician (Gynecology) Gavin Pound, MD as Consulting Physician (Rheumatology) Almedia Balls, MD as Consulting Physician (Orthopedic Surgery) Ardath Sax, MD (Inactive) as Consulting Physician (Hematology and Oncology) Megan Salon, MD as Consulting Physician (Gynecology)  Indicate any recent Medical Services you may have received from other than [facility] providers in the past year (date may be approximate).     Assessment:   This is a routine wellness examination for [facility].  Hearing/Vision screen No results found.  Dietary issues and exercise activities discussed: Current Exercise Habits: Home exercise routine, Type of exercise: walking;Other - see comments (hiking and pickle ball), Time (Minutes): 30, Frequency (Times/Week): 5, Weekly Exercise (Minutes/Week): 150, Intensity: Moderate, Exercise limited by: None identified   Goals Addressed   None   Depression Screen PHQ 2/9 Scores 12/25/2020 09/18/2020 09/08/2019 08/31/2018 08/17/2017 08/04/2016 06/20/2016  PHQ - 2 Score 0 0 0 0 0 0 0  PHQ- 9 Score - 0 - - 0 - -    Fall Risk Fall Risk  12/25/2020 09/18/2020 09/08/2019 08/31/2018 08/17/2017  Falls in the past year? 0 0 0 0 No  Comment - - - - -  Number falls in past yr: 0 0 0 0 -  Injury with Fall? 0 0 0 - -  Risk for fall due to : No Fall  Risks No Fall Risks No Fall Risks - -  Follow up Falls evaluation completed - Falls evaluation completed;Education provided - -    FALL RISK PREVENTION PERTAINING TO THE HOME:  Any stairs in or around the home? No  If so, are there any without handrails? No  Home free of loose throw rugs in walkways, pet beds, electrical cords, etc? Yes  Adequate lighting in your home to reduce risk of falls? Yes   ASSISTIVE DEVICES UTILIZED TO PREVENT FALLS:  Life alert? No  Use of a cane, walker or w/c? No  Grab bars in the bathroom? Yes  Shower chair or bench in shower? No  Elevated toilet seat or a handicapped toilet? Yes   TIMED UP AND GO:  Was the test performed? Yes .  Length of time to ambulate 10 feet: 6 sec.   Gait steady and fast without use of assistive device  Cognitive Function: Normal cognitive status assessed by direct observation by this Nurse Health Advisor. No abnormalities found.   MMSE - Mini Mental State Exam 08/17/2017  Orientation to time 5  Orientation to Place 5  Registration 3  Attention/ Calculation 5  Recall 3  Language- name 2 objects 2  Language- repeat 1  Language- follow 3 step command 3  Language- read & follow direction 1  Write a sentence 1  Copy design 1  Total score 30     6CIT Screen 09/08/2019  What Year? 0 points  What month? 0 points  What time? 0 points  Count back from 20 0 points  Months in reverse 0 points  Repeat phrase 0 points  Total Score 0    Immunizations Immunization History  Administered Date(s) Administered   Fluad Quad(high Dose 65+) 01/20/2019, 12/15/2019   Influenza Split 02/13/2015   Influenza,inj,Quad PF,6+ Mos 02/09/2016, 01/30/2017, 01/30/2018   Influenza-Unspecified 01/18/2013, 01/12/2014, 01/30/2016   PFIZER(Purple Top)SARS-COV-2 Vaccination 05/08/2019, 06/07/2019, 01/03/2020   Pneumococcal Conjugate-13 01/25/2014  Pneumococcal Polysaccharide-23 07/08/2009   Td 05/30/2003   Tdap 06/30/2013   Zoster  Recombinat (Shingrix) 11/11/2016, 01/01/2017   Zoster, Live 06/07/2008    TDAP status: Up to date  Flu Vaccine status: Completed at today's visit  Pneumococcal vaccine status: Up to date  Covid-19 vaccine status: Completed vaccines  Qualifies for Shingles Vaccine? Yes   Zostavax completed Yes   Shingrix Completed?: Yes  Screening Tests Health Maintenance  Topic Date Due   COVID-19 Vaccine (4 - Booster for Pfizer series) 03/27/2020   INFLUENZA VACCINE  10/29/2020   TETANUS/TDAP  07/01/2023   DEXA SCAN  Completed   Zoster Vaccines- Shingrix  Completed   HPV VACCINES  Aged Out    Health Maintenance  Health Maintenance Due  Topic Date Due   COVID-19 Vaccine (4 - Booster for Pfizer series) 03/27/2020   INFLUENZA VACCINE  10/29/2020    Colorectal cancer screening: No longer required.   Mammogram status: Completed 05/26/2020. Repeat every year  Bone Density status: Completed 08/30/2018. Results reflect: Bone density results: OSTEOPENIA. Repeat every 2-3 years.  Lung Cancer Screening: (Low Dose CT Chest recommended if Age 67-80 years, 30 pack-year currently smoking OR have quit w/in 15years.) does not qualify.   Lung Cancer Screening Referral: no  Additional Screening:  Hepatitis C Screening: does not qualify; Completed: no  Vision Screening: Recommended annual ophthalmology exams for early detection of glaucoma and other disorders of the eye. Is the patient up to date with their annual eye exam?  Yes  Who is the provider or what is the name of the office in which the patient attends annual eye exams? [facility] If pt is not established with a provider, would they like to be referred to a provider to establish care? No .   Dental Screening: Recommended annual dental exams for proper oral hygiene  Community Resource Referral / Chronic Care Management: CRR required this visit?  No   CCM required this visit?  No      Plan:     I have personally reviewed  and noted the following in the patient's chart:   Medical and social history Use of alcohol, tobacco or illicit drugs  Current medications and supplements including opioid prescriptions.  Functional ability and status Nutritional status Physical activity Advanced directives List of other physicians Hospitalizations, surgeries, and ER visits in previous 12 months Vitals Screenings to include cognitive, depression, and falls Referrals and appointments  In addition, I have reviewed and discussed with patient certain preventive protocols, quality metrics, and best practice recommendations. A written personalized care plan for preventive services as well as general preventive health recommendations were provided to patient.     Sheral Flow, LPN   [id number]/46/[id number]   Nurse Notes:  Hearing Screening - Comments:: Patient denied any hearing difficulty.   No hearing aids.  Vision Screening - Comments:: Patient wears corrective glasses/contacts.  Eye exam done annually by: [place] screening examination/treatment/procedure(s) were performed by non-physician practitioner and as supervising physician I was immediately available for consultation/collaboration.  I agree with above. Lew Dawes, MD

## 2021-04-10 ENCOUNTER — Telehealth (HOSPITAL_BASED_OUTPATIENT_CLINIC_OR_DEPARTMENT_OTHER): Payer: Self-pay | Admitting: Obstetrics & Gynecology

## 2021-04-10 NOTE — Telephone Encounter (Signed)
Patient sent a my chart message called patient back and left a message to please call the office back.

## 2021-04-16 ENCOUNTER — Other Ambulatory Visit (HOSPITAL_COMMUNITY)
Admission: RE | Admit: 2021-04-16 | Discharge: 2021-04-16 | Disposition: A | Discharge status: Home / Self Care | Payer: Medicare Other | Source: Ambulatory Visit | Attending: Obstetrics & Gynecology | Admitting: Obstetrics & Gynecology

## 2021-04-16 ENCOUNTER — Other Ambulatory Visit: Payer: Self-pay

## 2021-04-16 ENCOUNTER — Encounter (HOSPITAL_BASED_OUTPATIENT_CLINIC_OR_DEPARTMENT_OTHER): Payer: Self-pay | Admitting: Obstetrics & Gynecology

## 2021-04-16 ENCOUNTER — Ambulatory Visit (HOSPITAL_BASED_OUTPATIENT_CLINIC_OR_DEPARTMENT_OTHER): Payer: Medicare Other | Admitting: Obstetrics & Gynecology

## 2021-04-16 VITALS — BP 154/60 | HR 71 | Ht 65.0 in | Wt 131.0 lb

## 2021-04-16 DIAGNOSIS — L292 Pruritus vulvae: Secondary | ICD-10-CM | POA: Diagnosis not present

## 2021-04-16 MED ORDER — TRIAMCINOLONE ACETONIDE 0.5 % EX OINT: TOPICAL_OINTMENT | CUTANEOUS | 0 refills | Status: DC

## 2021-04-16 NOTE — Progress Notes (Signed)
GYNECOLOGY  VISIT  CC:   vulvar itching  HPI: 81 y.o. G2P2 Married White or Caucasian female here for complaint of itchy vulvar area that has been present for about a month.  She hasn't seen anything when she looks at the skin.  She has noticed a little vaginal discharge as well.  Denies vaginal bleeding or pelvic pain.  She is PMP and is not on HRT.  Patient Active Problem List   Diagnosis Date Noted   Coronary atherosclerosis 09/18/2020   Atherosclerosis of aorta ([place]) 09/18/2020   Familial tremor 09/18/2020   Ganglion 12/15/2019   Toe pain, chronic, left 12/15/2019   Osteopenia 09/07/2018   Creatinine elevation 04/23/2017   Urinary tract infection 04/23/2017   Hematuria, microscopic 04/23/2017   Anemia due to vitamin B12 deficiency 06/15/2015   Elevated glucose 06/15/2015   Essential hypertension 02/05/2015   Smell disturbance 02/05/2015   Breast cancer of upper-outer quadrant of left female breast ([place]) 01/10/2013   Chronic venous insufficiency 10/08/2012   Hiccups 07/08/2012   Varicose veins of bilateral lower extremities with other complications 86/57/8469   Well adult exam 07/08/2011   PARESTHESIA 05/08/2009   Osteoarthritis 06/07/2008   GOITER, NONTOXIC MULTINODULAR 01/20/2007    Past Medical History:  Diagnosis Date   Adenomatous colon polyp 2014   Allergy    Anemia    Arthritis    Breast cancer ([place]) 2011   DCIF   Hypertension    Osteopenia    Personal history of radiation therapy    Varicose veins     Past Surgical History:  Procedure Laterality Date   [place]   pt states appendix was on left side not rt. per surgeon   BREAST LUMPECTOMY Left 2011   left, reexcision 1/12   CATARACT EXTRACTION, BILATERAL     COLONOSCOPY     COLONOSCOPY W/ POLYPECTOMY  2014   ENDOVENOUS ABLATION SAPHENOUS VEIN W/ LASER Left 01-27-2013   left greater saphenous vein and sclerotherapy left leg by Curt Jews MD   ENDOVENOUS  ABLATION SAPHENOUS VEIN W/ LASER Right 02-10-2013   right greater saphenous vein and sclerotherapy right leg by Curt Jews MD   POLYPECTOMY     ROTATOR CUFF REPAIR Right 2009   SHOULDER SURGERY  2003   tear   stab phlebectomy Left 07-21-2013   10-20 incisions left leg by Curt Jews MD   TRICEPS TENDON REPAIR  2009    MEDS:   Current Outpatient Medications on File Prior to Visit  Medication Sig Dispense Refill   amLODipine (NORVASC) 2.5 MG tablet TAKE 1 TABLET BY MOUTH DAILY 90 tablet 3   aspirin EC 81 MG tablet Take 1 tablet (81 mg total) by mouth daily. 100 tablet 3   cyanocobalamin 1000 MCG tablet Take 1,000 mcg by mouth daily.     irbesartan (AVAPRO) 150 MG tablet TAKE 1 TABLET BY MOUTH DAILY 90 tablet 3   metronidazole (NORITATE) 1 % cream Apply topically 2 (two) times daily.      Multiple Vitamins-Minerals (MULTIVITAMIN WITH MINERALS) tablet Take 1 tablet by mouth 3 (three) times a week.      Red Yeast Rice Extract 300 MG CAPS Take 1 capsule by mouth daily. 100 capsule 3   VITAMIN D PO Take 1,000 Int'l Units by mouth.     No current facility-administered medications on file prior to visit.    ALLERGIES: Aspirin and Codeine sulfate  Family History  Problem Relation Age  of Onset   Cancer Mother    Hypertension Mother    Other Mother        varicose veins   Heart disease Father    Diabetes Father    Breast cancer Maternal Aunt    Colon cancer Neg Hx    Colon polyps Neg Hx    Esophageal cancer Neg Hx    Rectal cancer Neg Hx    Stomach cancer Neg Hx     SH:  married, non smoker  Review of Systems  Constitutional: Negative.   Genitourinary: Negative.    PHYSICAL EXAMINATION:    BP (!) 154/60 (BP Location: Left Arm, Patient Position: Sitting, Cuff Size: Normal)    Pulse 71    Ht 5\' 5"  (1.651 m) Comment: reported   Wt 131 lb (59.4 kg)    LMP 04/01/1983    BMI 21.80 kg/m     General appearance: alert, cooperative and appears stated age Lymph:  no inguinal LAD  noted  Pelvic: External genitalia:  no lesions              Urethra:  normal appearing urethra with no masses, tenderness or lesions              Bartholins and Skenes: normal                 Vagina: normal appearing vagina with normal color and discharge, no lesions   Chaperone, Octaviano Batty, CMA, was present for exam.  Assessment/Plan: 1. Vulvar itching - Cervicovaginal ancillary only( [place]) - no significant physical exam findings noted - triamcinolone ointment (KENALOG) 0.5 %; Use nightly to affected area for 7-10 days  Dispense: 30 g; Refill: 0

## 2021-04-17 LAB — CERVICOVAGINAL ANCILLARY ONLY
Bacterial Vaginitis (gardnerella): NEGATIVE
Candida Glabrata: NEGATIVE
Candida Vaginitis: NEGATIVE
Comment: NEGATIVE
Comment: NEGATIVE
Comment: NEGATIVE

## 2021-05-24 ENCOUNTER — Other Ambulatory Visit: Payer: Self-pay | Admitting: Internal Medicine

## 2021-05-24 DIAGNOSIS — Z1231 Encounter for screening mammogram for malignant neoplasm of breast: Secondary | ICD-10-CM

## 2021-05-28 ENCOUNTER — Ambulatory Visit
Admission: RE | Admit: 2021-05-28 | Discharge: 2021-05-28 | Disposition: A | Discharge status: Home / Self Care | Payer: Medicare Other | Source: Ambulatory Visit | Attending: Internal Medicine | Admitting: Internal Medicine

## 2021-05-28 ENCOUNTER — Other Ambulatory Visit: Payer: Self-pay

## 2021-05-28 DIAGNOSIS — Z1231 Encounter for screening mammogram for malignant neoplasm of breast: Secondary | ICD-10-CM

## 2021-09-10 ENCOUNTER — Other Ambulatory Visit: Payer: Self-pay | Admitting: Internal Medicine

## 2021-09-24 ENCOUNTER — Encounter: Payer: Self-pay | Admitting: Internal Medicine

## 2021-09-24 ENCOUNTER — Ambulatory Visit (INDEPENDENT_AMBULATORY_CARE_PROVIDER_SITE_OTHER): Payer: Medicare Other | Admitting: Internal Medicine

## 2021-09-24 VITALS — BP 140/68 | HR 68 | Temp 98.3°F | Ht 65.0 in | Wt 129.0 lb

## 2021-09-24 DIAGNOSIS — Z136 Encounter for screening for cardiovascular disorders: Secondary | ICD-10-CM | POA: Diagnosis not present

## 2021-09-24 DIAGNOSIS — T148XXA Other injury of unspecified body region, initial encounter: Secondary | ICD-10-CM | POA: Diagnosis not present

## 2021-09-24 DIAGNOSIS — D519 Vitamin B12 deficiency anemia, unspecified: Secondary | ICD-10-CM | POA: Diagnosis not present

## 2021-09-24 DIAGNOSIS — I700 Atherosclerosis of aorta: Secondary | ICD-10-CM

## 2021-09-24 DIAGNOSIS — I10 Essential (primary) hypertension: Secondary | ICD-10-CM | POA: Diagnosis not present

## 2021-09-24 DIAGNOSIS — R7309 Other abnormal glucose: Secondary | ICD-10-CM | POA: Diagnosis not present

## 2021-09-24 DIAGNOSIS — Z0000 Encounter for general adult medical examination without abnormal findings: Secondary | ICD-10-CM

## 2021-09-24 DIAGNOSIS — R7989 Other specified abnormal findings of blood chemistry: Secondary | ICD-10-CM

## 2021-09-24 LAB — CBC WITH DIFFERENTIAL/PLATELET
Basophils Absolute: 0 10*3/uL (ref 0.0–0.1)
Basophils Relative: 0.6 % (ref 0.0–3.0)
Eosinophils Absolute: 0.1 10*3/uL (ref 0.0–0.7)
Eosinophils Relative: 2.5 % (ref 0.0–5.0)
HCT: 35.1 % — ABNORMAL LOW (ref 36.0–46.0)
Hemoglobin: 11.8 g/dL — ABNORMAL LOW (ref 12.0–15.0)
Lymphocytes Relative: 18.6 % (ref 12.0–46.0)
Lymphs Abs: 0.9 10*3/uL (ref 0.7–4.0)
MCHC: 33.5 g/dL (ref 30.0–36.0)
MCV: 91.5 fl (ref 78.0–100.0)
Monocytes Absolute: 0.3 10*3/uL (ref 0.1–1.0)
Monocytes Relative: 6.8 % (ref 3.0–12.0)
Neutro Abs: 3.6 10*3/uL (ref 1.4–7.7)
Neutrophils Relative %: 71.5 % (ref 43.0–77.0)
Platelets: 204 10*3/uL (ref 150.0–400.0)
RBC: 3.83 Mil/uL — ABNORMAL LOW (ref 3.87–5.11)
RDW: 13 % (ref 11.5–15.5)
WBC: 5.1 10*3/uL (ref 4.0–10.5)

## 2021-09-24 LAB — COMPREHENSIVE METABOLIC PANEL
ALT: 13 U/L (ref 0–35)
AST: 16 U/L (ref 0–37)
Albumin: 4.3 g/dL (ref 3.5–5.2)
Alkaline Phosphatase: 68 U/L (ref 39–117)
BUN: 17 mg/dL (ref 6–23)
CO2: 28 mEq/L (ref 19–32)
Calcium: 10 mg/dL (ref 8.4–10.5)
Chloride: 104 mEq/L (ref 96–112)
Creatinine, Ser: 0.99 mg/dL (ref 0.40–1.20)
GFR: 52.99 mL/min — ABNORMAL LOW (ref >60.00)
Glucose, Bld: 97 mg/dL (ref 70–99)
Potassium: 4.7 mEq/L (ref 3.5–5.1)
Sodium: 138 mEq/L (ref 135–145)
Total Bilirubin: 0.6 mg/dL (ref 0.2–1.2)
Total Protein: 7 g/dL (ref 6.0–8.3)

## 2021-09-24 LAB — URINALYSIS
Bilirubin Urine: NEGATIVE
Hgb urine dipstick: NEGATIVE
Ketones, ur: NEGATIVE
Leukocytes,Ua: NEGATIVE
Nitrite: NEGATIVE
Specific Gravity, Urine: 1.015 (ref 1.000–1.030)
Total Protein, Urine: NEGATIVE
Urine Glucose: NEGATIVE
Urobilinogen, UA: 0.2 (ref 0.0–1.0)
pH: 7 (ref 5.0–8.0)

## 2021-09-24 LAB — VITAMIN B12: Vitamin B-12: 1383 pg/mL — ABNORMAL HIGH (ref 211–911)

## 2021-09-24 LAB — HEMOGLOBIN A1C: Hgb A1c MFr Bld: 6 % (ref 4.6–6.5)

## 2021-09-24 LAB — LIPID PANEL
Cholesterol: 194 mg/dL (ref 0–200)
HDL: 81.9 mg/dL (ref >39.00)
LDL Cholesterol: 97 mg/dL (ref 0–99)
NonHDL: 111.73
Total CHOL/HDL Ratio: 2
Triglycerides: 72 mg/dL (ref 0.0–149.0)
VLDL: 14.4 mg/dL (ref 0.0–40.0)

## 2021-09-24 LAB — TSH: TSH: 0.81 u[IU]/mL (ref 0.35–5.50)

## 2021-09-24 MED ORDER — AMLODIPINE BESYLATE 5 MG PO TABS: 5.0000 mg | ORAL_TABLET | Freq: Every day | ORAL | 3 refills | Status: DC

## 2021-09-24 MED ORDER — IRBESARTAN 150 MG PO TABS: 150.0000 mg | ORAL_TABLET | Freq: Every day | ORAL | 3 refills | Status: DC

## 2021-09-24 MED ORDER — ASPIRIN 81 MG PO TBEC: 81.0000 mg | DELAYED_RELEASE_TABLET | Freq: Every day | ORAL | 3 refills | Status: DC

## 2021-09-24 NOTE — Assessment & Plan Note (Signed)
  On diet  

## 2021-09-24 NOTE — Progress Notes (Signed)
Subjective:  Patient ID: Caroline Olson, female    DOB: 23-Jul-1940  Age: 81 y.o. MRN: [id number]  CC: No chief complaint on file.   HPI CAMELIA SUDHOFF presents for a well exam  Outpatient Medications Prior to Visit  Medication Sig Dispense Refill  . cyanocobalamin 1000 MCG tablet Take 1,000 mcg by mouth daily.    . metronidazole (NORITATE) 1 % cream Apply topically 2 (two) times daily.     . Multiple Vitamins-Minerals (MULTIVITAMIN WITH MINERALS) tablet Take 1 tablet by mouth 3 (three) times a week.     . Red Yeast Rice Extract 300 MG CAPS Take 1 capsule by mouth daily. 100 capsule 3  . triamcinolone ointment (KENALOG) 0.5 % Use nightly to affected area for 7-10 days 30 g 0  . VITAMIN D PO Take 1,000 Int'l Units by mouth.    Marland Kitchen amLODipine (NORVASC) 2.5 MG tablet Take 1 tablet (2.5 mg total) by mouth daily. Annual appt is due w/labs must see provider for future refills 30 tablet 0  . irbesartan (AVAPRO) 150 MG tablet TAKE 1 TABLET BY MOUTH DAILY 90 tablet 3   No facility-administered medications prior to visit.    ROS: Review of Systems  Constitutional:  Negative for activity change, appetite change, chills, fatigue and unexpected weight change.  HENT:  Negative for congestion, mouth sores and sinus pressure.   Eyes:  Negative for visual disturbance.  Respiratory:  Negative for cough and chest tightness.   Gastrointestinal:  Negative for abdominal pain and nausea.  Genitourinary:  Negative for difficulty urinating, frequency and vaginal pain.  Musculoskeletal:  Negative for arthralgias, back pain and gait problem.  Skin:  Negative for pallor and rash.  Neurological:  Negative for dizziness, tremors, weakness, numbness and headaches.  Hematological:  Bruises/bleeds easily.  Psychiatric/Behavioral:  Negative for confusion and sleep disturbance.     Objective:  BP 140/68 (BP Location: Left Arm, Patient Position: Sitting, Cuff Size: Normal)   Pulse 68   Temp 98.3 F (36.8 C)  (Oral)   Ht 5\' 5"  (1.651 m)   Wt 129 lb (58.5 kg)   LMP 04/01/1983   SpO2 94%   BMI 21.47 kg/m   BP Readings from Last 3 Encounters:  09/24/21 140/68  04/16/21 (!) 154/60  12/25/20 130/70    Wt Readings from Last 3 Encounters:  09/24/21 129 lb (58.5 kg)  04/16/21 131 lb (59.4 kg)  12/25/20 128 lb 9.6 oz (58.3 kg)    Physical Exam Constitutional:      General: She is not in acute distress.    Appearance: She is well-developed.  HENT:     Head: Normocephalic.     Right Ear: External ear normal.     Left Ear: External ear normal.     Nose: Nose normal.  Eyes:     General:        Right eye: No discharge.        Left eye: No discharge.     Conjunctiva/sclera: Conjunctivae normal.     Pupils: Pupils are equal, round, and reactive to light.  Neck:     Thyroid: No thyromegaly.     Vascular: No JVD.     Trachea: No tracheal deviation.  Cardiovascular:     Rate and Rhythm: Normal rate and regular rhythm.     Heart sounds: Normal heart sounds.  Pulmonary:     Effort: No respiratory distress.     Breath sounds: No stridor. No wheezing.  Abdominal:  General: Bowel sounds are normal. There is no distension.     Palpations: Abdomen is soft. There is no mass.     Tenderness: There is no abdominal tenderness. There is no guarding or rebound.  Musculoskeletal:        General: No tenderness.     Cervical back: Normal range of motion and neck supple. No rigidity.  Lymphadenopathy:     Cervical: No cervical adenopathy.  Skin:    Findings: No erythema or rash.  Neurological:     Cranial Nerves: No cranial nerve deficit.     Motor: No abnormal muscle tone.     Coordination: Coordination normal.     Deep Tendon Reflexes: Reflexes normal.  Psychiatric:        Behavior: Behavior normal.        Thought Content: Thought content normal.        Judgment: Judgment normal.    Lab Results  Component Value Date   WBC 5.3 09/18/2020   HGB 12.0 09/18/2020   HCT 34.9 (L)  09/18/2020   PLT 255.0 09/18/2020   GLUCOSE 101 (H) 09/18/2020   CHOL 202 (H) 09/18/2020   TRIG 66.0 09/18/2020   HDL 80.20 09/18/2020   LDLDIRECT 104.3 06/07/2008   LDLCALC 109 (H) 09/18/2020   ALT 16 09/18/2020   AST 19 09/18/2020   NA 138 09/18/2020   K 5.1 09/18/2020   CL 103 09/18/2020   CREATININE 1.05 09/18/2020   BUN 21 09/18/2020   CO2 28 09/18/2020   TSH 0.85 09/18/2020   HGBA1C 6.0 09/18/2020    MM 3D SCREEN BREAST BILATERAL  Result Date: 05/29/2021 CLINICAL DATA:  Screening. EXAM: DIGITAL SCREENING BILATERAL MAMMOGRAM WITH TOMOSYNTHESIS AND CAD TECHNIQUE: Bilateral screening digital craniocaudal and mediolateral oblique mammograms were obtained. Bilateral screening digital breast tomosynthesis was performed. The images were evaluated with computer-aided detection. COMPARISON:  Previous exam(s). ACR Breast Density Category c: The breast tissue is heterogeneously dense, which may obscure small masses. FINDINGS: There are no findings suspicious for malignancy. IMPRESSION: No mammographic evidence of malignancy. A result letter of this screening mammogram will be mailed directly to the patient. RECOMMENDATION: Screening mammogram in one year. (Code:SM-B-01Y) BI-RADS CATEGORY  1: Negative. Electronically Signed   By: Elberta Fortis M.D.   On: 05/29/2021 12:42    Assessment & Plan:   Problem List Items Addressed This Visit     Anemia due to vitamin B12 deficiency    On B12      Relevant Orders   Vitamin B12   Atherosclerosis of aorta ([facility])    On diet      Relevant Medications   aspirin EC 81 MG tablet   amLODipine (NORVASC) 5 MG tablet   irbesartan (AVAPRO) 150 MG tablet   Bruising    Arnica cream for bruises      Relevant Orders   CBC with Differential/Platelet   Creatinine elevation    Monitor GFR      Elevated glucose    Check A1c      Relevant Orders   Hemoglobin A1c   Essential hypertension    Cont on Avapro      Relevant Medications   aspirin  EC 81 MG tablet   amLODipine (NORVASC) 5 MG tablet   irbesartan (AVAPRO) 150 MG tablet   Well adult exam - Primary     We discussed age appropriate health related issues, including available/recomended screening tests and vaccinations. Labs were ordered to be later reviewed . All questions were  answered. We discussed one or more of the following - seat belt use, use of sunscreen/sun exposure exercise, fall risk reduction, second hand smoke exposure, firearm use and storage, seat belt use, a need for adhering to healthy diet and exercise. Labs were ordered.  All questions were answered. Arnica cream for bruises       Relevant Orders   TSH   Urinalysis   CBC with Differential/Platelet   Lipid panel   Comprehensive metabolic panel   Hemoglobin A1c   Vitamin B12      Meds ordered this encounter  Medications  . aspirin EC 81 MG tablet    Sig: Take 1 tablet (81 mg total) by mouth daily.    Dispense:  100 tablet    Refill:  3  . amLODipine (NORVASC) 5 MG tablet    Sig: Take 1 tablet (5 mg total) by mouth daily.    Dispense:  90 tablet    Refill:  3  . irbesartan (AVAPRO) 150 MG tablet    Sig: Take 1 tablet (150 mg total) by mouth daily.    Dispense:  90 tablet    Refill:  3      Follow-up: Return in about 1 year (around 09/25/2022) for Wellness Exam.  Sonda Primes, MD

## 2021-09-24 NOTE — Assessment & Plan Note (Signed)
Arnica cream for bruises 

## 2021-09-24 NOTE — Assessment & Plan Note (Signed)

## 2021-09-26 ENCOUNTER — Encounter: Payer: Self-pay | Admitting: Internal Medicine

## 2021-10-02 ENCOUNTER — Other Ambulatory Visit: Payer: Self-pay | Admitting: Internal Medicine

## 2021-10-02 MED ORDER — CYANOCOBALAMIN 1000 MCG PO TABS: 500.0000 ug | ORAL_TABLET | ORAL | 1 refills | Status: AC

## 2021-10-10 ENCOUNTER — Encounter (HOSPITAL_BASED_OUTPATIENT_CLINIC_OR_DEPARTMENT_OTHER): Payer: Self-pay | Admitting: Obstetrics & Gynecology

## 2021-10-10 ENCOUNTER — Ambulatory Visit (INDEPENDENT_AMBULATORY_CARE_PROVIDER_SITE_OTHER): Payer: Medicare Other | Admitting: Obstetrics & Gynecology

## 2021-10-10 VITALS — BP 138/66 | HR 63 | Ht 65.0 in | Wt 128.8 lb

## 2021-10-10 DIAGNOSIS — G250 Essential tremor: Secondary | ICD-10-CM

## 2021-10-10 DIAGNOSIS — Z853 Personal history of malignant neoplasm of breast: Secondary | ICD-10-CM | POA: Diagnosis not present

## 2021-10-10 DIAGNOSIS — D0512 Intraductal carcinoma in situ of left breast: Secondary | ICD-10-CM

## 2021-10-10 DIAGNOSIS — Z01419 Encounter for gynecological examination (general) (routine) without abnormal findings: Secondary | ICD-10-CM | POA: Diagnosis not present

## 2021-10-10 DIAGNOSIS — Z90710 Acquired absence of both cervix and uterus: Secondary | ICD-10-CM

## 2021-10-10 DIAGNOSIS — L292 Pruritus vulvae: Secondary | ICD-10-CM | POA: Diagnosis not present

## 2021-10-10 DIAGNOSIS — M8580 Other specified disorders of bone density and structure, unspecified site: Secondary | ICD-10-CM

## 2021-10-10 NOTE — Progress Notes (Signed)
81 y.o. G2P2 Married White or Caucasian female here for breast and pelvic exam.  Denies vaginal bleeding.  She was seen in January for some vulvar itching.  Was given kenolog rx to use.  Reports this does help but wasn't sure if this was something she could keep using.  Isn't present all of the time.  Would like some recommendations.    Just saw Dr. Alain Marion last month.  Did blood work.    Patient's last menstrual period was 04/01/1983.          Sexually active: Yes.    H/O STD:  no  Health Maintenance: PCP:  [facility].  Last wellness appt was 08/2021.  Did blood work at that appt:  yes Vaccines are up to date:  yes Colonoscopy:  2019, Dr. Carlean Purl.  No routine follow up recommended. MMG:  05/2021 BMD:  2020 Last pap smear:  h/o hysterectomy    reports that she has never smoked. She has never used smokeless tobacco. She reports current alcohol use of about 5.0 standard drinks of alcohol per week. She reports that she does not use drugs.  Past Medical History:  Diagnosis Date   Adenomatous colon polyp 2014   Allergy    Anemia    Arthritis    Breast cancer ([place]) 2011   DCIF   Hypertension    Osteopenia    Personal history of radiation therapy    Varicose veins     Past Surgical History:  Procedure Laterality Date   [place]   pt states appendix was on left side not rt. per surgeon   BREAST LUMPECTOMY Left 2011   left, reexcision 1/12   CATARACT EXTRACTION, BILATERAL     COLONOSCOPY     COLONOSCOPY W/ POLYPECTOMY  2014   ENDOVENOUS ABLATION SAPHENOUS VEIN W/ LASER Left 01-27-2013   left greater saphenous vein and sclerotherapy left leg by Curt Jews MD   ENDOVENOUS ABLATION SAPHENOUS VEIN W/ LASER Right 02-10-2013   right greater saphenous vein and sclerotherapy right leg by Curt Jews MD   POLYPECTOMY     ROTATOR CUFF REPAIR Right 2009   SHOULDER SURGERY  2003   tear   stab phlebectomy Left 07-21-2013   10-20 incisions left leg  by Curt Jews MD   TRICEPS TENDON REPAIR  2009    Current Outpatient Medications  Medication Sig Dispense Refill   amLODipine (NORVASC) 5 MG tablet Take 1 tablet (5 mg total) by mouth daily. 90 tablet 3   cyanocobalamin 1000 MCG tablet Take 0.5 tablets (500 mcg total) by mouth every other day. 100 tablet 1   irbesartan (AVAPRO) 150 MG tablet Take 1 tablet (150 mg total) by mouth daily. 90 tablet 3   metronidazole (NORITATE) 1 % cream Apply topically 2 (two) times daily.      Multiple Vitamins-Minerals (MULTIVITAMIN WITH MINERALS) tablet Take 1 tablet by mouth 3 (three) times a week.      Red Yeast Rice Extract 300 MG CAPS Take 1 capsule by mouth daily. 100 capsule 3   triamcinolone ointment (KENALOG) 0.5 % Use nightly to affected area for 7-10 days 30 g 0   VITAMIN D PO Take 1,000 Int'l Units by mouth.     No current facility-administered medications for this visit.    Family History  Problem Relation Age of Onset   Cancer Mother    Hypertension Mother    Other Mother  varicose veins   Heart disease Father    Diabetes Father    Breast cancer Maternal Aunt    Colon cancer Neg Hx    Colon polyps Neg Hx    Esophageal cancer Neg Hx    Rectal cancer Neg Hx    Stomach cancer Neg Hx     Review of Systems  Constitutional: Negative.   Genitourinary: Negative.     Exam:   BP 138/66 (BP Location: Right Arm, Patient Position: Sitting, Cuff Size: Normal)   Pulse 63   Ht '5\' 5"'$  (1.651 m) Comment: Reported  Wt 128 lb 12.8 oz (58.4 kg)   LMP 04/01/1983   BMI 21.43 kg/m   Height: '5\' 5"'$  (165.1 cm) (Reported)  General appearance: alert, cooperative and appears stated age Breasts: normal appearance, no masses or tenderness Abdomen: soft, non-tender; bowel sounds normal; no masses,  no organomegaly Lymph nodes: Cervical, supraclavicular, and axillary nodes normal.  No abnormal inguinal nodes palpated Neurologic: Grossly normal  Pelvic: External genitalia:  no lesions               Urethra:  normal appearing urethra with no masses, tenderness or lesions              Bartholins and Skenes: normal                 Vagina: normal appearing vagina with atrophic changes and no discharge, no lesions              Cervix: absent              Pap taken: No. Bimanual Exam:  Uterus:  uterus absent              Adnexa: no mass, fullness, tenderness               Rectovaginal: Confirms               Anus:  normal sphincter tone, no lesions  Chaperone, Octaviano Batty, CMA, was present for exam.  Assessment/Plan: 1. Encntr for gyn exam (general) (routine) w/o abn findings - pap not indicated - mammogram 05/2021 - BMD order placed - colonoscopy 2019 - vaccines reviewed/update  2. Osteopenia, unspecified location - DG BONE DENSITY (DXA); Future  3. Familial tremor  4. History of hysterectomy - h/o TAH  5. Ductal carcinoma in situ (DCIS) of left breast - 12/11.  Has been released from oncology.  6.  Vulvar itching - there are absolutely not visible skin changes.  Itching it intermittent and not present today.  Feel must be topical product being used that is causing this.  Reviewed products she is using and made some recommendations regarding changes that could be made.  If persists, even on intermittent basis, would consider skin biopsy.  Pt advised to give update in 3-4 weeks.

## 2021-10-13 DIAGNOSIS — D0512 Intraductal carcinoma in situ of left breast: Secondary | ICD-10-CM | POA: Insufficient documentation

## 2021-10-13 DIAGNOSIS — L292 Pruritus vulvae: Secondary | ICD-10-CM | POA: Insufficient documentation

## 2021-10-22 ENCOUNTER — Other Ambulatory Visit: Payer: Self-pay | Admitting: Internal Medicine

## 2021-10-22 DIAGNOSIS — Z1231 Encounter for screening mammogram for malignant neoplasm of breast: Secondary | ICD-10-CM

## 2022-01-02 ENCOUNTER — Ambulatory Visit (INDEPENDENT_AMBULATORY_CARE_PROVIDER_SITE_OTHER): Payer: Medicare Other

## 2022-01-02 VITALS — BP 122/60 | HR 60 | Temp 97.3°F | Resp 16 | Ht 65.0 in | Wt 129.4 lb

## 2022-01-02 DIAGNOSIS — Z23 Encounter for immunization: Secondary | ICD-10-CM | POA: Diagnosis not present

## 2022-01-02 DIAGNOSIS — Z0000 Encounter for general adult medical examination without abnormal findings: Secondary | ICD-10-CM

## 2022-01-02 NOTE — Progress Notes (Signed)
Subjective:   Caroline Olson is a 81 y.o. female who presents for Medicare Annual (Subsequent) preventive examination.  Review of Systems     Cardiac Risk Factors include: advanced age (>33mn, >>45women);hypertension;family history of premature cardiovascular disease     Objective:    Today's Vitals   01/02/22 1116  BP: 122/60  Pulse: 60  Resp: 16  Temp: (!) 97.3 F (36.3 C)  SpO2: 98%  Weight: 129 lb 6.4 oz (58.7 kg)  Height: '5\' 5"'$  (1.651 m)  PainSc: 0-No pain   Body mass index is 21.53 kg/m.     01/02/2022   11:49 AM 12/25/2020   11:28 AM 09/08/2019   10:21 AM 08/31/2018   10:16 AM 08/17/2017    9:37 AM 08/04/2016    9:35 AM 02/05/2015    9:30 AM  Advanced Directives  Does Patient Have a Medical Advance Directive? Yes Yes Yes Yes Yes Yes Yes  Type of [place] will  Living will;Healthcare Power of [place] will [place] will   Does patient want to make changes to medical advance directive? No - Patient declined No - Patient declined No - Patient declined   Yes (ED - Information included in AVS)   Copy of [place] Chart? Yes - validated most recent copy scanned in chart (See row information)   No - copy requested No - copy requested Yes Yes    Current Medications (verified) Outpatient Encounter Medications as of 01/02/2022  Medication Sig   amLODipine (NORVASC) 5 MG tablet Take 1 tablet (5 mg total) by mouth daily.   cyanocobalamin 1000 MCG tablet Take 0.5 tablets (500 mcg total) by mouth every other day.   irbesartan (AVAPRO) 150 MG tablet TAKE 1 TABLET BY MOUTH DAILY   metronidazole (NORITATE) 1 % cream Apply topically 2 (two) times daily.    Multiple Vitamins-Minerals (MULTIVITAMIN WITH MINERALS) tablet Take 1 tablet by mouth 3 (three) times a week.    Red Yeast Rice Extract 300 MG CAPS Take 1 capsule  by mouth daily.   triamcinolone ointment (KENALOG) 0.5 % Use nightly to affected area for 7-10 days   VITAMIN D PO Take 1,000 Int'l Units by mouth.   No facility-administered encounter medications on file as of 01/02/2022.    Allergies (verified) Aspirin and Codeine sulfate   History: Past Medical History:  Diagnosis Date   Adenomatous colon polyp 2014   Allergy    Anemia    Arthritis    Breast cancer ([place] 2011   DCIF   Hypertension    Osteopenia    Personal history of radiation therapy    Varicose veins    Past Surgical History:  Procedure Laterality Date   [place]  pt states appendix was on left side not rt. per surgeon   BREAST LUMPECTOMY Left 2011   left, reexcision 1/12   CATARACT EXTRACTION, BILATERAL     COLONOSCOPY     COLONOSCOPY W/ POLYPECTOMY  2014   ENDOVENOUS ABLATION SAPHENOUS VEIN W/ LASER Left 01-27-2013   left greater saphenous vein and sclerotherapy left leg by TCurt JewsMD   ENDOVENOUS ABLATION SAPHENOUS VEIN W/ LASER Right 02-10-2013   right greater saphenous vein and sclerotherapy right leg by TCurt JewsMD   POLYPECTOMY     ROTATOR CUFF REPAIR Right 2009   SHOULDER SURGERY  2003   tear   stab phlebectomy Left 07-21-2013   10-20 incisions left leg by Curt Jews MD   TRICEPS TENDON REPAIR  2009   Family History  Problem Relation Age of Onset   Cancer Mother    Hypertension Mother    Other Mother        varicose veins   Heart disease Father    Diabetes Father    Breast cancer Maternal Aunt    Colon cancer Neg Hx    Colon polyps Neg Hx    Esophageal cancer Neg Hx    Rectal cancer Neg Hx    Stomach cancer Neg Hx    Social History   Socioeconomic History   Marital status: Married    Spouse name: Not on file   Number of children: 2   Years of education: Not on file   Highest education level: Not on file  Occupational History   Occupation: retired   Tobacco Use   Smoking status: Never    Smokeless tobacco: Never  Vaping Use   Vaping Use: Never used  Substance and Sexual Activity   Alcohol use: Yes    Alcohol/week: 5.0 standard drinks of alcohol    Types: 5 Glasses of wine per week   Drug use: No   Sexual activity: Yes    Partners: Male    Birth control/protection: Surgical    Comment: TAH/BSO  Other Topics Concern   Not on file  Social History Narrative   Patient is married she is retired and has 2 daughters   Some alcohol use no tobacco or drug use   Social Determinants of Radio broadcast assistant Strain: Low Risk  (01/02/2022)   Overall Financial Resource Strain (CARDIA)    Difficulty of Paying Living Expenses: Not hard at all  Food Insecurity: No Food Insecurity (01/02/2022)   Hunger Vital Sign    Worried About Running Out of Food in the Last Year: Never true    [place] in the Last Year: Never true  Transportation Needs: No Transportation Needs (01/02/2022)   PRAPARE - Hydrologist (Medical): No    Lack of Transportation (Non-Medical): No  Physical Activity: Sufficiently Active (01/02/2022)   Exercise Vital Sign    Days of Exercise per Week: 5 days    Minutes of Exercise per Session: 30 min  Stress: No Stress Concern Present (01/02/2022)   [place]    Feeling of Stress : Not at all  Social Connections: [place] (01/02/2022)   Social Connection and Isolation Panel [NHANES]    Frequency of Communication with Friends and Family: More than three times a week    Frequency of Social Gatherings with Friends and Family: More than three times a week    Attends Religious Services: More than 4 times per year    Active Member of [employer] or Organizations: Yes    Attends Music therapist: More than 4 times per year    Marital Status: Married    Tobacco Counseling Counseling given: Not Answered   Clinical Intake:  Pre-visit preparation  completed: Yes  Pain : No/denies pain Pain Score: 0-No pain     BMI - recorded: 21.53 Nutritional Status: BMI of 19-24  Normal Nutritional Risks: None Diabetes: No CBG done?: No Did pt. bring in CBG monitor from home?: No  How often do you need to have someone help you when you  read instructions, pamphlets, or other written materials from your doctor or pharmacy?: 1 - Never What is the last grade level you completed in school?: HSG; LPN Training  Diabetic? no  Interpreter Needed?: No  Information entered by :: Lisette Abu, LPN.   Activities of Daily Living    01/02/2022   11:20 AM  In your present state of health, do you have any difficulty performing the following activities:  Hearing? 0  Vision? 0  Difficulty concentrating or making decisions? 0  Walking or climbing stairs? 0  Dressing or bathing? 0  Doing errands, shopping? 0  Preparing Food and eating ? N  Using the Toilet? N  In the past six months, have you accidently leaked urine? N  Do you have problems with loss of bowel control? N  Managing your Medications? N  Managing your Finances? N  Housekeeping or managing your Housekeeping? N    Patient Care Team: Plotnikov, Evie Lacks, MD as PCP - General Coralie Keens, MD (General Surgery) Megan Salon, MD as Attending Physician (Gynecology) Gavin Pound, MD as Consulting Physician (Rheumatology) Ardath Sax, MD (Inactive) as Consulting Physician (Hematology and Oncology) Megan Salon, MD as Consulting Physician (Gynecology) Lonia Skinner, MD as Consulting Physician (Ophthalmology) Earlie Server, MD as Consulting Physician (Orthopedic Surgery)  Indicate any recent Medical Services you may have received from other than [facility] providers in the past year (date may be approximate).     Assessment:   This is a routine wellness examination for [facility].  Hearing/Vision screen Hearing Screening - Comments:: Patient denied any hearing  difficulty.   No hearing aids.  Vision Screening - Comments:: Patient does wear corrective lenses/contacts.  Eye exam done by: Gala Romney, MD.   Dietary issues and exercise activities discussed: Current Exercise Habits: Home exercise routine, Type of exercise: walking;Other - see comments (hiking, pickle ball), Time (Minutes): 60, Frequency (Times/Week): 5, Weekly Exercise (Minutes/Week): 300, Intensity: Moderate, Exercise limited by: None identified   Goals Addressed             This Visit's Progress    My goal is to keep moving on by being physically active and independent.        Depression Screen    01/02/2022   11:46 AM 10/10/2021   10:27 AM 04/16/2021    2:28 PM 12/25/2020   11:26 AM 09/18/2020    9:35 AM 09/08/2019   10:22 AM 08/31/2018   10:16 AM  PHQ 2/9 Scores  PHQ - 2 Score 0 0 0 0 0 0 0  PHQ- 9 Score     0      Fall Risk    01/02/2022   11:50 AM 12/25/2020   11:29 AM 09/18/2020    9:34 AM 09/08/2019   10:21 AM 08/31/2018   10:16 AM  Fall Risk   Falls in the past year? 0 0 0 0 0  Number falls in past yr: 0 0 0 0 0  Injury with Fall? 0 0 0 0   Risk for fall due to : No Fall Risks No Fall Risks No Fall Risks No Fall Risks   Follow up Falls prevention discussed Falls evaluation completed  Falls evaluation completed;Education provided     FALL RISK PREVENTION PERTAINING TO THE HOME:  Any stairs in or around the home? No  If so, are there any without handrails? No  Home free of loose throw rugs in walkways, pet beds, electrical cords, etc? Yes  Adequate lighting in  your home to reduce risk of falls? Yes   ASSISTIVE DEVICES UTILIZED TO PREVENT FALLS:  Life alert? No  Use of a cane, walker or w/c? No  Grab bars in the bathroom? No  Shower chair or bench in shower? No  Elevated toilet seat or a handicapped toilet? Yes   TIMED UP AND GO:  Was the test performed? Yes .  Length of time to ambulate 10 feet: 6 sec.   Gait steady and fast without use of  assistive device  Cognitive Function:    08/17/2017    9:39 AM  MMSE - Mini Mental State Exam  Orientation to time 5  Orientation to Place 5  Registration 3  Attention/ Calculation 5  Recall 3  Language- name 2 objects 2  Language- repeat 1  Language- follow 3 step command 3  Language- read & follow direction 1  Write a sentence 1  Copy design 1  Total score 30        01/02/2022   11:19 AM 09/08/2019   10:24 AM  6CIT Screen  What Year? 0 points 0 points  What month? 0 points 0 points  What time? 0 points 0 points  Count back from 20 0 points 0 points  Months in reverse 0 points 0 points  Repeat phrase 0 points 0 points  Total Score 0 points 0 points    Immunizations Immunization History  Administered Date(s) Administered   Fluad Quad(high Dose 65+) 01/20/2019, 12/15/2019, 12/25/2020   Influenza Split 02/13/2015   Influenza,inj,Quad PF,6+ Mos 02/09/2016, 01/30/2017, 01/30/2018   Influenza-Unspecified 01/18/2013, 01/12/2014, 01/30/2016   PFIZER(Purple Top)SARS-COV-2 Vaccination 05/08/2019, 06/07/2019, 01/03/2020   Pneumococcal Conjugate-13 01/25/2014   Pneumococcal Polysaccharide-23 07/08/2009   Td 05/30/2003   Tdap 06/30/2013   Zoster Recombinat (Shingrix) 11/11/2016, 01/01/2017   Zoster, Live 06/07/2008    TDAP status: Up to date  Flu Vaccine status: Completed at today's visit  Pneumococcal vaccine status: Up to date  Covid-19 vaccine status: Completed vaccines  Qualifies for Shingles Vaccine? Yes   Zostavax completed Yes   Shingrix Completed?: Yes  Screening Tests Health Maintenance  Topic Date Due   COVID-19 Vaccine (4 - Pfizer risk series) 02/28/2020   INFLUENZA VACCINE  10/29/2021   TETANUS/TDAP  07/01/2023   Pneumonia Vaccine 57+ Years old  Completed   DEXA SCAN  Completed   Zoster Vaccines- Shingrix  Completed   HPV VACCINES  Aged Out    Health Maintenance  Health Maintenance Due  Topic Date Due   COVID-19 Vaccine (4 - Pfizer risk  series) 02/28/2020   INFLUENZA VACCINE  10/29/2021    Colorectal cancer screening: No longer required.   Mammogram status: Completed 05/28/2021. Repeat every year  Bone Density status: Completed 08/30/2018. Results reflect: Bone density results: OSTEOPENIA. Repeat every 2-3 years. (Scheduled for 06/03/2022)  Lung Cancer Screening: (Low Dose CT Chest recommended if Age 32-80 years, 30 pack-year currently smoking OR have quit w/in 15years.) does not qualify.   Lung Cancer Screening Referral: no  Additional Screening:  Hepatitis C Screening: does not qualify; Completed no  Vision Screening: Recommended annual ophthalmology exams for early detection of glaucoma and other disorders of the eye. Is the patient up to date with their annual eye exam?  Yes  Who is the provider or what is the name of the office in which the patient attends annual eye exams? Gala Romney, MD. If pt is not established with a provider, would they like to be referred to a provider to  establish care? No .   Dental Screening: Recommended annual dental exams for proper oral hygiene  Community Resource Referral / Chronic Care Management: CRR required this visit?  No   CCM required this visit?  No      Plan:     I have personally reviewed and noted the following in the patient's chart:   Medical and social history Use of alcohol, tobacco or illicit drugs  Current medications and supplements including opioid prescriptions. Patient is not currently taking opioid prescriptions. Functional ability and status Nutritional status Physical activity Advanced directives List of other physicians Hospitalizations, surgeries, and ER visits in previous 12 months Vitals Screenings to include cognitive, depression, and falls Referrals and appointments  In addition, I have reviewed and discussed with patient certain preventive protocols, quality metrics, and best practice recommendations. A written personalized care plan for  preventive services as well as general preventive health recommendations were provided to patient.     Sheral Flow, LPN   [id number]   Nurse Notes: N/A

## 2022-01-02 NOTE — Patient Instructions (Addendum)
Caroline Olson , Thank you for taking time to come for your Medicare Wellness Visit. I appreciate your ongoing commitment to your health goals. Please review the following plan we discussed and let me know if I can assist you in the future.   These are the goals we discussed:  Goals      My goal is to keep moving on by being physically active and independent.        This is a list of the screening recommended for you and due dates:  Health Maintenance  Topic Date Due   COVID-19 Vaccine (4 - Pfizer risk series) 02/28/2020   Flu Shot  10/29/2021   Tetanus Vaccine  07/01/2023   Pneumonia Vaccine  Completed   DEXA scan (bone density measurement)  Completed   Zoster (Shingles) Vaccine  Completed   HPV Vaccine  Aged Out    Advanced directives: Yes; documents on file.  Conditions/risks identified: Yes  Next appointment: Follow up in one year for your annual wellness visit.   Preventive Care 80 Years and Older, Female Preventive care refers to lifestyle choices and visits with your health care provider that can promote health and wellness. What does preventive care include? A yearly physical exam. This is also called an annual well check. Dental exams once or twice a year. Routine eye exams. Ask your health care provider how often you should have your eyes checked. Personal lifestyle choices, including: Daily care of your teeth and gums. Regular physical activity. Eating a healthy diet. Avoiding tobacco and drug use. Limiting alcohol use. Practicing safe sex. Taking low-dose aspirin every day. Taking vitamin and mineral supplements as recommended by your health care provider. What happens during an annual well check? The services and screenings done by your health care provider during your annual well check will depend on your age, overall health, lifestyle risk factors, and family history of disease. Counseling  Your health care provider may ask you questions about your: Alcohol  use. Tobacco use. Drug use. Emotional well-being. Home and relationship well-being. Sexual activity. Eating habits. History of falls. Memory and ability to understand (cognition). Work and work Statistician. Reproductive health. Screening  You may have the following tests or measurements: Height, weight, and BMI. Blood pressure. Lipid and cholesterol levels. These may be checked every 5 years, or more frequently if you are over 83 years old. Skin check. Lung cancer screening. You may have this screening every year starting at age 83 if you have a 30-pack-year history of smoking and currently smoke or have quit within the past 15 years. Fecal occult blood test (FOBT) of the stool. You may have this test every year starting at age 83. Flexible sigmoidoscopy or colonoscopy. You may have a sigmoidoscopy every 5 years or a colonoscopy every 10 years starting at age 83. Hepatitis C blood test. Hepatitis B blood test. Sexually transmitted disease (STD) testing. Diabetes screening. This is done by checking your blood sugar (glucose) after you have not eaten for a while (fasting). You may have this done every 1-3 years. Bone density scan. This is done to screen for osteoporosis. You may have this done starting at age 83. Mammogram. This may be done every 1-2 years. Talk to your health care provider about how often you should have regular mammograms. Talk with your health care provider about your test results, treatment options, and if necessary, the need for more tests. Vaccines  Your health care provider may recommend certain vaccines, such as: Influenza vaccine. This  is recommended every year. Tetanus, diphtheria, and acellular pertussis (Tdap, Td) vaccine. You may need a Td booster every 10 years. Zoster vaccine. You may need this after age 83. Pneumococcal 13-valent conjugate (PCV13) vaccine. One dose is recommended after age 83. Pneumococcal polysaccharide (PPSV23) vaccine. One dose is  recommended after age 83. Talk to your health care provider about which screenings and vaccines you need and how often you need them. This information is not intended to replace advice given to you by your health care provider. Make sure you discuss any questions you have with your health care provider. Document Released: 04/13/2015 Document Revised: 12/05/2015 Document Reviewed: 01/16/2015 Elsevier Interactive Patient Education  2017 [place] Prevention in the Home Falls can cause injuries. They can happen to people of all ages. There are many things you can do to make your home safe and to help prevent falls. What can I do on the outside of my home? Regularly fix the edges of walkways and driveways and fix any cracks. Remove anything that might make you trip as you walk through a door, such as a raised step or threshold. Trim any bushes or trees on the path to your home. Use bright outdoor lighting. Clear any walking paths of anything that might make someone trip, such as rocks or tools. Regularly check to see if handrails are loose or broken. Make sure that both sides of any steps have handrails. Any raised decks and porches should have guardrails on the edges. Have any leaves, snow, or ice cleared regularly. Use sand or salt on walking paths during winter. Clean up any spills in your garage right away. This includes oil or grease spills. What can I do in the bathroom? Use night lights. Install grab bars by the toilet and in the tub and shower. Do not use towel bars as grab bars. Use non-skid mats or decals in the tub or shower. If you need to sit down in the shower, use a plastic, non-slip stool. Keep the floor dry. Clean up any water that spills on the floor as soon as it happens. Remove soap buildup in the tub or shower regularly. Attach bath mats securely with double-sided non-slip rug tape. Do not have throw rugs and other things on the floor that can make you  trip. What can I do in the bedroom? Use night lights. Make sure that you have a light by your bed that is easy to reach. Do not use any sheets or blankets that are too big for your bed. They should not hang down onto the floor. Have a firm chair that has side arms. You can use this for support while you get dressed. Do not have throw rugs and other things on the floor that can make you trip. What can I do in the kitchen? Clean up any spills right away. Avoid walking on wet floors. Keep items that you use a lot in easy-to-reach places. If you need to reach something above you, use a strong step stool that has a grab bar. Keep electrical cords out of the way. Do not use floor polish or wax that makes floors slippery. If you must use wax, use non-skid floor wax. Do not have throw rugs and other things on the floor that can make you trip. What can I do with my stairs? Do not leave any items on the stairs. Make sure that there are handrails on both sides of the stairs and use them. Fix handrails that  are broken or loose. Make sure that handrails are as long as the stairways. Check any carpeting to make sure that it is firmly attached to the stairs. Fix any carpet that is loose or worn. Avoid having throw rugs at the top or bottom of the stairs. If you do have throw rugs, attach them to the floor with carpet tape. Make sure that you have a light switch at the top of the stairs and the bottom of the stairs. If you do not have them, ask someone to add them for you. What else can I do to help prevent falls? Wear shoes that: Do not have high heels. Have rubber bottoms. Are comfortable and fit you well. Are closed at the toe. Do not wear sandals. If you use a stepladder: Make sure that it is fully opened. Do not climb a closed stepladder. Make sure that both sides of the stepladder are locked into place. Ask someone to hold it for you, if possible. Clearly mark and make sure that you can  see: Any grab bars or handrails. First and last steps. Where the edge of each step is. Use tools that help you move around (mobility aids) if they are needed. These include: Canes. Walkers. Scooters. Crutches. Turn on the lights when you go into a dark area. Replace any light bulbs as soon as they burn out. Set up your furniture so you have a clear path. Avoid moving your furniture around. If any of your floors are uneven, fix them. If there are any pets around you, be aware of where they are. Review your medicines with your doctor. Some medicines can make you feel dizzy. This can increase your chance of falling. Ask your doctor what other things that you can do to help prevent falls. This information is not intended to replace advice given to you by your health care provider. Make sure you discuss any questions you have with your health care provider. Document Released: 01/11/2009 Document Revised: 08/23/2015 Document Reviewed: 04/21/2014 Elsevier Interactive Patient Education  2017 [employer].

## 2022-04-28 ENCOUNTER — Encounter (HOSPITAL_BASED_OUTPATIENT_CLINIC_OR_DEPARTMENT_OTHER): Payer: Self-pay | Admitting: Obstetrics & Gynecology

## 2022-04-28 ENCOUNTER — Ambulatory Visit (HOSPITAL_BASED_OUTPATIENT_CLINIC_OR_DEPARTMENT_OTHER): Payer: Medicare Other | Admitting: Obstetrics & Gynecology

## 2022-04-28 ENCOUNTER — Other Ambulatory Visit (HOSPITAL_COMMUNITY)
Admission: RE | Admit: 2022-04-28 | Discharge: 2022-04-28 | Disposition: A | Discharge status: Home / Self Care | Payer: Medicare Other | Source: Ambulatory Visit | Attending: Obstetrics & Gynecology | Admitting: Obstetrics & Gynecology

## 2022-04-28 VITALS — BP 120/66 | HR 65 | Ht 65.0 in | Wt 134.4 lb

## 2022-04-28 DIAGNOSIS — L292 Pruritus vulvae: Secondary | ICD-10-CM | POA: Insufficient documentation

## 2022-04-28 DIAGNOSIS — L280 Lichen simplex chronicus: Secondary | ICD-10-CM | POA: Diagnosis not present

## 2022-04-28 NOTE — Progress Notes (Signed)
GYNECOLOGY  VISIT  CC:   swollen right labia  HPI: 82 y.o. G2P2 Married White or Caucasian female here for complaint of thickening, swelling of the right labia majora.  She has been present about 4-6 weeks.  Today when she was showing, after drying off, she noticed a little blood.  There is a "blistery" feeling area where she thought the blood came from.    She is continuing to having some vulvar itching.  She uses a topical steroid every two to three weeks.  She notices more symptoms after she showers.  Vaginitis testing was 04/16/2021 was negative.  We discussed doing a vulvar biopsy if itching continued.  Discussed today.  She is ok proceeding with this today.     Past Medical History:  Diagnosis Date   Adenomatous colon polyp 2014   Allergy    Anemia    Arthritis    Breast cancer ([place]) 2011   DCIF   Hypertension    Osteopenia    Personal history of radiation therapy    Varicose veins     MEDS:   Current Outpatient Medications on File Prior to Visit  Medication Sig Dispense Refill   amLODipine (NORVASC) 5 MG tablet Take 1 tablet (5 mg total) by mouth daily. 90 tablet 3   cyanocobalamin 1000 MCG tablet Take 0.5 tablets (500 mcg total) by mouth every other day. 100 tablet 1   irbesartan (AVAPRO) 150 MG tablet TAKE 1 TABLET BY MOUTH DAILY 90 tablet 3   metronidazole (NORITATE) 1 % cream Apply topically 2 (two) times daily.      Multiple Vitamins-Minerals (MULTIVITAMIN WITH MINERALS) tablet Take 1 tablet by mouth 3 (three) times a week.      Red Yeast Rice Extract 300 MG CAPS Take 1 capsule by mouth daily. 100 capsule 3   triamcinolone ointment (KENALOG) 0.5 % Use nightly to affected area for 7-10 days 30 g 0   VITAMIN D PO Take 1,000 Int'l Units by mouth.     No current facility-administered medications on file prior to visit.    ALLERGIES: Aspirin and Codeine sulfate  SH:  married, non smoker  Review of Systems  Constitutional: Negative.   Genitourinary:        Vulvar  itching    PHYSICAL EXAMINATION:    BP 120/66   Pulse 65   Ht '5\' 5"'$  (1.651 m) Comment: Reported  Wt 134 lb 6.4 oz (61 kg)   LMP 04/01/1983   BMI 22.37 kg/m     General appearance: alert, cooperative and appears stated age Lymph:  no inguinal LAD noted  Pelvic: External genitalia:  thickening and erythema of labia majora and down around rectum, also several small hemangiomas noted--one with obvious recent bleeding              Urethra:  normal appearing urethra with no masses, tenderness or lesions              Bartholins and Skenes: normal                 Vagina: normal appearing vagina with normal color and discharge, no lesions               Procedure:  Area cleansed with Betadine.  Sterile technique used throughout procedure.  Skin anesthestized with Lidocaine 1% plain; 2.34m.  3 punch biopsy used to obtain specimen.  Biopsy grasped with pick-ups and excised with scissors.  Adequate hemostasis obtained with silver nitrate sticks.  Dressing was  not applied.  Pt tolerated procedure well  Chaperone, Ezekiel Ina, RN, was present for exam.  Assessment/Plan: 1. Vulvar itching - vulvar biopsy obtained today.  Topical neosporin  can be applied after tonight.   - Results will be called to pt - Surgical pathology( [place]) - Cervicovaginal ancillary only( [place])

## 2022-04-29 LAB — CERVICOVAGINAL ANCILLARY ONLY
Candida Glabrata: NEGATIVE
Candida Vaginitis: NEGATIVE
Comment: NEGATIVE
Comment: NEGATIVE

## 2022-04-30 LAB — SURGICAL PATHOLOGY

## 2022-05-09 ENCOUNTER — Encounter (HOSPITAL_BASED_OUTPATIENT_CLINIC_OR_DEPARTMENT_OTHER): Payer: Self-pay | Admitting: Obstetrics & Gynecology

## 2022-05-09 DIAGNOSIS — L280 Lichen simplex chronicus: Secondary | ICD-10-CM

## 2022-05-09 HISTORY — DX: Lichen simplex chronicus: L28.0

## 2022-05-19 ENCOUNTER — Encounter: Payer: Self-pay | Admitting: *Deleted

## 2022-06-03 ENCOUNTER — Other Ambulatory Visit (HOSPITAL_BASED_OUTPATIENT_CLINIC_OR_DEPARTMENT_OTHER): Payer: Self-pay | Admitting: Obstetrics & Gynecology

## 2022-06-03 ENCOUNTER — Ambulatory Visit
Admission: RE | Admit: 2022-06-03 | Discharge: 2022-06-03 | Disposition: A | Discharge status: Home / Self Care | Payer: Medicare Other | Source: Ambulatory Visit | Attending: Internal Medicine | Admitting: Internal Medicine

## 2022-06-03 ENCOUNTER — Ambulatory Visit
Admission: RE | Admit: 2022-06-03 | Discharge: 2022-06-03 | Disposition: A | Discharge status: Home / Self Care | Payer: Medicare Other | Source: Ambulatory Visit | Attending: Obstetrics & Gynecology | Admitting: Obstetrics & Gynecology

## 2022-06-03 DIAGNOSIS — Z1231 Encounter for screening mammogram for malignant neoplasm of breast: Secondary | ICD-10-CM

## 2022-06-03 DIAGNOSIS — M8580 Other specified disorders of bone density and structure, unspecified site: Secondary | ICD-10-CM

## 2022-06-18 ENCOUNTER — Encounter: Payer: Self-pay | Admitting: Podiatry

## 2022-06-18 ENCOUNTER — Ambulatory Visit: Payer: Medicare Other | Admitting: Podiatry

## 2022-06-18 DIAGNOSIS — L600 Ingrowing nail: Secondary | ICD-10-CM

## 2022-06-18 NOTE — Patient Instructions (Signed)

## 2022-06-18 NOTE — Progress Notes (Signed)
Subjective:   Patient ID: Caroline Olson, female   DOB: 82 y.o.   MRN: [id number]   HPI Patient presents with severely thickened dystrophic deformed big toenail left foot   ROS      Objective:  Physical Exam  Chronic thickness and yellow big toenail left that she cannot take care of herself and becomes painful     Assessment:  Mycotic nail infection big toenail left     Plan:  Reviewed condition she wants the nail removed I do think is in her best interest and I reviewed removal of the nail.  At this point patient had the toe anesthetized 60 mg like Marcaine mixture sterile prep done using sterile instrumentation remove the hallux nail exposed matrix applied phenol for applications 30 seconds followed by alcohol lavage sterile dressing gave instructions on soaks reappoint to recheck

## 2022-06-26 ENCOUNTER — Encounter (HOSPITAL_BASED_OUTPATIENT_CLINIC_OR_DEPARTMENT_OTHER): Payer: Self-pay | Admitting: Obstetrics & Gynecology

## 2022-06-26 ENCOUNTER — Ambulatory Visit (HOSPITAL_BASED_OUTPATIENT_CLINIC_OR_DEPARTMENT_OTHER): Payer: Medicare Other | Admitting: Obstetrics & Gynecology

## 2022-06-26 VITALS — BP 146/62 | HR 57 | Ht 65.0 in | Wt 133.2 lb

## 2022-06-26 DIAGNOSIS — M810 Age-related osteoporosis without current pathological fracture: Secondary | ICD-10-CM

## 2022-06-26 MED ORDER — ALENDRONATE SODIUM 70 MG PO TABS: 70.0000 mg | ORAL_TABLET | ORAL | 12 refills | Status: DC

## 2022-06-28 NOTE — Progress Notes (Signed)
Subjective:    43 yrs Married Caucasian G2P2  female here to discuss recent BMD obtained 06/03/2022 showing osteopenia with high risk FRAX scoring.  Osteopenia has not changed much but due to age, FRAX scoring is now 5% risk for hip fracture over next 10 years.  We discussed this is a reason to consider treatment.  Options discussed..     Osteoporosis Risk Factors  Personal Hx of fracture as an adult: no Caucasian race: yes Dementia: no Poor health/frailty: no Tobacco use: no Low body weight (<127 lbs): no Estrogen deficiency with either early menopause (age <45) or bilateral ovariectomy: no Low calcium intake (lifelong): no Alcohol use more than 2 drinks per day: no Recurrent falls: no Inadequate physical activity: no  Current calcium and Vit D intake: MVI and 1000 IU Vit D  Past Medical History:  Diagnosis Date   Adenomatous colon polyp 2014   Allergy    Anemia    Arthritis    Breast cancer ([place]) 2011   DCIF   Hypertension    Lichen simplex chronicus 05/09/2022   on vulvar biopsy   Osteopenia    Personal history of radiation therapy    Varicose veins    Current Outpatient Medications on File Prior to Visit  Medication Sig Dispense Refill   amLODipine (NORVASC) 5 MG tablet Take 1 tablet (5 mg total) by mouth daily. 90 tablet 3   cyanocobalamin 1000 MCG tablet Take 0.5 tablets (500 mcg total) by mouth every other day. 100 tablet 1   irbesartan (AVAPRO) 150 MG tablet TAKE 1 TABLET BY MOUTH DAILY 90 tablet 3   metronidazole (NORITATE) 1 % cream Apply topically 2 (two) times daily.      Multiple Vitamins-Minerals (MULTIVITAMIN WITH MINERALS) tablet Take 1 tablet by mouth 3 (three) times a week.      Red Yeast Rice Extract 300 MG CAPS Take 1 capsule by mouth daily. 100 capsule 3   VITAMIN D PO Take 1,000 Int'l Units by mouth.     No current facility-administered medications on file prior to visit.   Allergies  Allergen Reactions   Aspirin Other (See Comments)    REACTION:  ringing in ears   Codeine Sulfate Nausea And Vomiting    Review of Systems Pertinent items noted in HPI and remainder of comprehensive ROS otherwise negative.     Objective:   PHYSICAL EXAM BP (!) 146/62 (BP Location: Right Arm, Patient Position: Sitting, Cuff Size: Normal)   Pulse (!) 57   Ht 5\' 5"  (1.651 m) Comment: Reported  Wt 133 lb 3.2 oz (60.4 kg)   LMP 04/01/1983   BMI 22.17 kg/m  General appearance: alert and no distress  Imaging Bone Density: Spine T Score: -1.4, Hip T Score: worse measuring in right femoral neck with T score -2.1   FRAX score:  10 year probability of hip fracture: 5%.                        10-year probability of major osteoporotic fractures  is 14.9%.                                         Assessment:   Osteopenia with T score -2.1 but high risk FRAX score   Plan:   1.  Patient counseled in adequate calcium and vitamin D exposure.  Calcium - 500 - 1000  mg elemental calcium/day in divided doses  Vitamin D - 800 IU/day  Told not to take at same time as bisphosphonate. 2.  Exercise recommended at least 30 minutes 3 times per week.  3.  Fall prevention discussed. 4.  Treatment options discussed.  Decided to start treatment with fosamax 70mg  weekly.  Dosing and administration (be upright, take with water, and nothing to eat/drink for 30 minutes reviewed.  Side effects discussed including osteonecrosis of bone.  Needs to let me know if going to have any dental work. 5.  Repeat bone density in 2 years.

## 2022-07-17 ENCOUNTER — Encounter (HOSPITAL_BASED_OUTPATIENT_CLINIC_OR_DEPARTMENT_OTHER): Payer: Self-pay | Admitting: Obstetrics & Gynecology

## 2022-09-30 ENCOUNTER — Other Ambulatory Visit: Payer: Self-pay | Admitting: Internal Medicine

## 2022-10-09 ENCOUNTER — Ambulatory Visit (INDEPENDENT_AMBULATORY_CARE_PROVIDER_SITE_OTHER): Payer: Medicare Other | Admitting: Internal Medicine

## 2022-10-09 ENCOUNTER — Encounter: Payer: Self-pay | Admitting: Internal Medicine

## 2022-10-09 VITALS — BP 130/70 | HR 65 | Temp 98.4°F | Ht 65.0 in | Wt 131.0 lb

## 2022-10-09 DIAGNOSIS — E559 Vitamin D deficiency, unspecified: Secondary | ICD-10-CM

## 2022-10-09 DIAGNOSIS — M170 Bilateral primary osteoarthritis of knee: Secondary | ICD-10-CM | POA: Diagnosis not present

## 2022-10-09 DIAGNOSIS — R202 Paresthesia of skin: Secondary | ICD-10-CM | POA: Diagnosis not present

## 2022-10-09 DIAGNOSIS — Z13220 Encounter for screening for lipoid disorders: Secondary | ICD-10-CM | POA: Diagnosis not present

## 2022-10-09 DIAGNOSIS — Z136 Encounter for screening for cardiovascular disorders: Secondary | ICD-10-CM

## 2022-10-09 DIAGNOSIS — D519 Vitamin B12 deficiency anemia, unspecified: Secondary | ICD-10-CM

## 2022-10-09 DIAGNOSIS — E875 Hyperkalemia: Secondary | ICD-10-CM

## 2022-10-09 DIAGNOSIS — Z0001 Encounter for general adult medical examination with abnormal findings: Secondary | ICD-10-CM

## 2022-10-09 DIAGNOSIS — M8580 Other specified disorders of bone density and structure, unspecified site: Secondary | ICD-10-CM

## 2022-10-09 DIAGNOSIS — Z0000 Encounter for general adult medical examination without abnormal findings: Secondary | ICD-10-CM

## 2022-10-09 LAB — URINALYSIS, ROUTINE W REFLEX MICROSCOPIC
Bilirubin Urine: NEGATIVE
Hgb urine dipstick: NEGATIVE
Ketones, ur: NEGATIVE
Nitrite: NEGATIVE
Specific Gravity, Urine: 1.015 (ref 1.000–1.030)
Total Protein, Urine: NEGATIVE
Urine Glucose: NEGATIVE
Urobilinogen, UA: 0.2 (ref 0.0–1.0)
pH: 7 (ref 5.0–8.0)

## 2022-10-09 LAB — LIPID PANEL
Cholesterol: 206 mg/dL — ABNORMAL HIGH (ref 0–200)
HDL: 85.6 mg/dL (ref >39.00)
LDL Cholesterol: 108 mg/dL — ABNORMAL HIGH (ref 0–99)
NonHDL: 119.94
Total CHOL/HDL Ratio: 2
Triglycerides: 59 mg/dL (ref 0.0–149.0)
VLDL: 11.8 mg/dL (ref 0.0–40.0)

## 2022-10-09 LAB — CBC WITH DIFFERENTIAL/PLATELET
Basophils Absolute: 0 10*3/uL (ref 0.0–0.1)
Basophils Relative: 0.7 % (ref 0.0–3.0)
Eosinophils Absolute: 0.1 10*3/uL (ref 0.0–0.7)
Eosinophils Relative: 2 % (ref 0.0–5.0)
HCT: 35.1 % — ABNORMAL LOW (ref 36.0–46.0)
Hemoglobin: 11.9 g/dL — ABNORMAL LOW (ref 12.0–15.0)
Lymphocytes Relative: 19.2 % (ref 12.0–46.0)
Lymphs Abs: 0.9 10*3/uL (ref 0.7–4.0)
MCHC: 33.8 g/dL (ref 30.0–36.0)
MCV: 91.1 fl (ref 78.0–100.0)
Monocytes Absolute: 0.4 10*3/uL (ref 0.1–1.0)
Monocytes Relative: 7.3 % (ref 3.0–12.0)
Neutro Abs: 3.5 10*3/uL (ref 1.4–7.7)
Neutrophils Relative %: 70.8 % (ref 43.0–77.0)
Platelets: 246 10*3/uL (ref 150.0–400.0)
RBC: 3.85 Mil/uL — ABNORMAL LOW (ref 3.87–5.11)
RDW: 13.3 % (ref 11.5–15.5)
WBC: 4.9 10*3/uL (ref 4.0–10.5)

## 2022-10-09 LAB — COMPREHENSIVE METABOLIC PANEL
ALT: 14 U/L (ref 0–35)
AST: 18 U/L (ref 0–37)
Albumin: 4.4 g/dL (ref 3.5–5.2)
Alkaline Phosphatase: 51 U/L (ref 39–117)
BUN: 28 mg/dL — ABNORMAL HIGH (ref 6–23)
CO2: 29 mEq/L (ref 19–32)
Calcium: 10 mg/dL (ref 8.4–10.5)
Chloride: 103 mEq/L (ref 96–112)
Creatinine, Ser: 1.07 mg/dL (ref 0.40–1.20)
GFR: 47.92 mL/min — ABNORMAL LOW (ref >60.00)
Glucose, Bld: 106 mg/dL — ABNORMAL HIGH (ref 70–99)
Potassium: 5.2 mEq/L — ABNORMAL HIGH (ref 3.5–5.1)
Sodium: 137 mEq/L (ref 135–145)
Total Bilirubin: 0.7 mg/dL (ref 0.2–1.2)
Total Protein: 7.1 g/dL (ref 6.0–8.3)

## 2022-10-09 LAB — VITAMIN D 25 HYDROXY (VIT D DEFICIENCY, FRACTURES): VITD: 53.18 ng/mL (ref 30.00–100.00)

## 2022-10-09 LAB — VITAMIN B12: Vitamin B-12: 388 pg/mL (ref 211–911)

## 2022-10-09 LAB — TSH: TSH: 0.55 u[IU]/mL (ref 0.35–5.50)

## 2022-10-09 MED ORDER — AMLODIPINE BESYLATE 5 MG PO TABS: 5.0000 mg | ORAL_TABLET | Freq: Every day | ORAL | 3 refills | Status: DC

## 2022-10-09 MED ORDER — IRBESARTAN 150 MG PO TABS: 150.0000 mg | ORAL_TABLET | Freq: Every day | ORAL | 3 refills | Status: DC

## 2022-10-09 NOTE — Progress Notes (Signed)
Subjective:  Patient ID: Caroline Olson, female    DOB: 1940-08-14  Age: 82 y.o. MRN: [id number]  CC: Annual Exam   HPI REI CONTEE presents for well exam C/o numbness in the L buttock, L genital area - started 2 d after reaching in the refrigerator - LBP x 1 day, then disappeared. Hiking, playing pickleball   Outpatient Medications Prior to Visit  Medication Sig Dispense Refill   alendronate (FOSAMAX) 70 MG tablet Take 1 tablet (70 mg total) by mouth every 7 (seven) days. Take first thing in am with 6 oz. Water.  Be upright after taking.  Eat nothing for one hour. 4 tablet 12   cyanocobalamin 1000 MCG tablet Take 0.5 tablets (500 mcg total) by mouth every other day. 100 tablet 1   metronidazole (NORITATE) 1 % cream Apply topically 2 (two) times daily.      Multiple Vitamins-Minerals (MULTIVITAMIN WITH MINERALS) tablet Take 1 tablet by mouth 3 (three) times a week.      Red Yeast Rice Extract 300 MG CAPS Take 1 capsule by mouth daily. 100 capsule 3   VITAMIN D PO Take 1,000 Int'l Units by mouth.     irbesartan (AVAPRO) 150 MG tablet TAKE 1 TABLET BY MOUTH DAILY 90 tablet 3   amLODipine (NORVASC) 5 MG tablet Take 1 tablet (5 mg total) by mouth daily. 90 tablet 3   No facility-administered medications prior to visit.    ROS: Review of Systems  Constitutional:  Negative for activity change, appetite change, chills, fatigue and unexpected weight change.  HENT:  Negative for congestion, mouth sores and sinus pressure.   Eyes:  Negative for visual disturbance.  Respiratory:  Negative for cough and chest tightness.   Gastrointestinal:  Negative for abdominal pain and nausea.  Genitourinary:  Negative for difficulty urinating, frequency and vaginal pain.  Musculoskeletal:  Negative for back pain and gait problem.  Skin:  Negative for pallor and rash.  Neurological:  Negative for dizziness, tremors, weakness, numbness and headaches.  Psychiatric/Behavioral:  Negative for confusion and  sleep disturbance.     Objective:  BP 130/70 (BP Location: Right Arm, Patient Position: Sitting, Cuff Size: Large)   Pulse 65   Temp 98.4 F (36.9 C) (Oral)   Ht 5\' 5"  (1.651 m)   Wt 131 lb (59.4 kg)   LMP 04/01/1983   SpO2 98%   BMI 21.80 kg/m   BP Readings from Last 3 Encounters:  10/09/22 130/70  06/26/22 (!) 146/62  04/28/22 120/66    Wt Readings from Last 3 Encounters:  10/09/22 131 lb (59.4 kg)  06/26/22 133 lb 3.2 oz (60.4 kg)  04/28/22 134 lb 6.4 oz (61 kg)    Physical Exam Constitutional:      General: She is not in acute distress.    Appearance: Normal appearance. She is well-developed.  HENT:     Head: Normocephalic.     Right Ear: External ear normal.     Left Ear: External ear normal.     Nose: Nose normal.  Eyes:     General:        Right eye: No discharge.        Left eye: No discharge.     Conjunctiva/sclera: Conjunctivae normal.     Pupils: Pupils are equal, round, and reactive to light.  Neck:     Thyroid: No thyromegaly.     Vascular: No JVD.     Trachea: No tracheal deviation.  Cardiovascular:  Rate and Rhythm: Normal rate and regular rhythm.     Heart sounds: Normal heart sounds.  Pulmonary:     Effort: No respiratory distress.     Breath sounds: No stridor. No wheezing.  Abdominal:     General: Bowel sounds are normal. There is no distension.     Palpations: Abdomen is soft. There is no mass.     Tenderness: There is no abdominal tenderness. There is no guarding or rebound.  Musculoskeletal:        General: No tenderness.     Cervical back: Normal range of motion and neck supple. No rigidity.  Lymphadenopathy:     Cervical: No cervical adenopathy.  Skin:    Findings: No erythema or rash.  Neurological:     Cranial Nerves: No cranial nerve deficit.     Motor: No abnormal muscle tone.     Coordination: Coordination normal.     Deep Tendon Reflexes: Reflexes normal.  Psychiatric:        Behavior: Behavior normal.         Thought Content: Thought content normal.        Judgment: Judgment normal.   Decr sensation over L buttock fold area  Lab Results  Component Value Date   WBC 5.1 09/24/2021   HGB 11.8 (L) 09/24/2021   HCT 35.1 (L) 09/24/2021   PLT 204.0 09/24/2021   GLUCOSE 97 09/24/2021   CHOL 194 09/24/2021   TRIG 72.0 09/24/2021   HDL 81.90 09/24/2021   LDLDIRECT 104.3 06/07/2008   LDLCALC 97 09/24/2021   ALT 13 09/24/2021   AST 16 09/24/2021   NA 138 09/24/2021   K 4.7 09/24/2021   CL 104 09/24/2021   CREATININE 0.99 09/24/2021   BUN 17 09/24/2021   CO2 28 09/24/2021   TSH 0.81 09/24/2021   HGBA1C 6.0 09/24/2021    MM 3D SCREEN BREAST BILATERAL  Result Date: 06/04/2022 CLINICAL DATA:  Screening. EXAM: DIGITAL SCREENING BILATERAL MAMMOGRAM WITH TOMOSYNTHESIS AND CAD TECHNIQUE: Bilateral screening digital craniocaudal and mediolateral oblique mammograms were obtained. Bilateral screening digital breast tomosynthesis was performed. The images were evaluated with computer-aided detection. COMPARISON:  Previous exam(s). ACR Breast Density Category c: The breasts are heterogeneously dense, which may obscure small masses. FINDINGS: There are no findings suspicious for malignancy. IMPRESSION: No mammographic evidence of malignancy. A result letter of this screening mammogram will be mailed directly to the patient. RECOMMENDATION: Screening mammogram in one year. (Code:SM-B-01Y) BI-RADS CATEGORY  1: Negative. Electronically Signed   By: Baird Lyons M.D.   On: 06/04/2022 17:42   DG BONE DENSITY (DXA)  Result Date: 06/03/2022 EXAM: DUAL X-RAY ABSORPTIOMETRY (DXA) FOR BONE MINERAL DENSITY IMPRESSION: Referring Physician:  Jerene Bears Your patient completed a bone mineral density test using GE Lunar iDXA system (analysis version: 16). Technologist:   lmn PATIENT: Name: Chariti, Havel Patient ID: [id number] Birth Date: [birth year] Height: 64.0 in. Sex: Female Measured: 06/03/2022 Weight: 129.0 lbs.  Indications: Advanced Age, Breast Cancer History, Caucasian, Estrogen Deficient, osteoarthiritis, Postmenopausal Fractures: None Treatments: Vitamin D (E933.5) ASSESSMENT: The BMD measured at Femur Neck Right is 0.752 g/cm2 with a T-score of -2.1. This patient is considered osteopenic/low bone mass according to World Health Organization [facility]) criteria. The quality of the exam is good. L1 and L3 were excluded due to degenerative changes. Site Region Measured Date Measured Age YA BMD Significant CHANGE T-score DualFemur Neck Right 06/03/2020 83.4 -2.1 0.752 g/cm2 * DualFemur Neck Right 08/30/2018    79.7         -  1.7    0.798 g/cm2 AP Spine  L3-L4      4/03/2022    83.4         -1.4    1.033 g/cm2 AP Spine  L3-L4      08/30/2018    79.7         -1.3    1.044 g/cm2 DualFemur Total Mean 06/03/2022    83.4         -1.3    0.850 g/cm2 DualFemur Total Mean 08/30/2018    79.7         -1.2    0.856 g/cm2 World Health Organization [facility]) criteria for post-menopausal, Caucasian Women: Normal       T-score at or above -1 SD Osteopenia   T-score between -1 and -2.5 SD Osteoporosis T-score at or below -2.5 SD RECOMMENDATION: 1. All patients should optimize calcium and vitamin D intake. 2. Consider FDA-approved medical therapies in postmenopausal women and men aged 61 years and older, based on the following: a. A hip or vertebral (clinical or morphometric) fracture. b. T-score = -2.5 at the femoral neck or spine after appropriate evaluation to exclude secondary causes. c. Low bone mass (T-score between -1.0 and -2.5 at the femoral neck or spine) and a 10-year probability of a hip fracture = 3% or a 10-year probability of a major osteoporosis-related fracture = 20% based on the [place]-adapted WHO algorithm. d. Clinician judgment and/or patient preferences may indicate treatment for people with 10-year fracture probabilities above or below these levels. FOLLOW-UP: Patients with diagnosis of osteoporosis or at high risk for fracture  should have regular bone mineral density tests.? Patients eligible for Medicare are allowed routine testing every 2 years.? The testing frequency can be increased to one year for patients who have rapidly progressing disease, are receiving or discontinuing medical therapy to restore bone mass, or have additional risk factors. I have reviewed this study and agree with the findings. [facility] FRAX* 10-year Probability of Fracture Based on femoral neck BMD: DualFemur (Right) Major Osteoporotic Fracture: 14.9% Hip Fracture:                5.0% Population:                  [place] (Caucasian) Risk Factors:                None *FRAX is a Armed forces logistics/support/administrative officer of the [employer] of [employer] for Metabolic Bone Disease, a World Science writer (WHO) [employer]. ASSESSMENT: The probability of a major osteoporotic fracture is 14.9% within the next ten years. The probability of a hip fracture is 5.0% within the next ten years. Electronically Signed   By: Bary Richard M.D.   On: 06/03/2022 10:59    Assessment & Plan:   Problem List Items Addressed This Visit     Osteoarthritis    LS X ray Acute numbness in the L buttock, L genital area - started 2 d after reaching in the refrigerator - LBP x 1 day, then disappeared. S1-2 distribution on the L      Well adult exam - Primary    We discussed age appropriate health related issues, including available/recomended screening tests and vaccinations. Labs were ordered to be later reviewed . All questions were answered. We discussed one or more of the following - seat belt use, use of sunscreen/sun exposure exercise, fall risk reduction, second hand smoke exposure, seat belt use, a need for adhering to healthy  diet and exercise. Mammo Eye exam q 12 mo BDS - Dr Hyacinth Meeker PAP - Dr Hyacinth Meeker Last colon 2019 Labs were ordered.  All questions were answered. Hiking, playing Pickleball       Relevant Orders   TSH   Urinalysis   CBC with  Differential/Platelet   Lipid panel   Comprehensive metabolic panel   Vitamin B12   Anemia due to vitamin B12 deficiency    Check Labs      Relevant Orders   TSH   Urinalysis   CBC with Differential/Platelet   Lipid panel   Comprehensive metabolic panel   Vitamin B12   Osteopenia    Dr Hyacinth Meeker - on Fosamax 05/2022      Paresthesia    Better Acute numbness in the L buttock, L genital area - started 2 d after reaching in the refrigerator - LBP x 1 day, then disappeared. S1-2 distribution on the L LS x ray if not better soon Vit B 12 level      Relevant Orders   Vitamin B12   Other Visit Diagnoses     Vitamin D deficiency       Relevant Orders   VITAMIN D 25 Hydroxy (Vit-D Deficiency, Fractures)         Meds ordered this encounter  Medications   amLODipine (NORVASC) 5 MG tablet    Sig: Take 1 tablet (5 mg total) by mouth daily.    Dispense:  90 tablet    Refill:  3   irbesartan (AVAPRO) 150 MG tablet    Sig: Take 1 tablet (150 mg total) by mouth daily.    Dispense:  90 tablet    Refill:  3      Follow-up: Return in about 1 year (around 10/09/2023) for Wellness Exam.  Sonda Primes, MD

## 2022-10-09 NOTE — Assessment & Plan Note (Addendum)
Better Acute numbness in the L buttock, L genital area - started 2 d after reaching in the refrigerator - LBP x 1 day, then disappeared. S1-2 distribution on the L LS x ray if not better soon Vit B 12 level

## 2022-10-09 NOTE — Assessment & Plan Note (Signed)
Dr Hyacinth Meeker - on Fosamax 05/2022

## 2022-10-09 NOTE — Assessment & Plan Note (Signed)
Check Labs. 

## 2022-10-09 NOTE — Assessment & Plan Note (Signed)
LS X ray Acute numbness in the L buttock, L genital area - started 2 d after reaching in the refrigerator - LBP x 1 day, then disappeared. S1-2 distribution on the L

## 2022-10-09 NOTE — Assessment & Plan Note (Signed)
We discussed age appropriate health related issues, including available/recomended screening tests and vaccinations. Labs were ordered to be later reviewed . All questions were answered. We discussed one or more of the following - seat belt use, use of sunscreen/sun exposure exercise, fall risk reduction, second hand smoke exposure, seat belt use, a need for adhering to healthy diet and exercise. Mammo Eye exam q 12 mo BDS - Dr Hyacinth Meeker PAP - Dr Hyacinth Meeker Last colon 2019 Labs were ordered.  All questions were answered. Hiking, playing Pickleball

## 2022-10-11 NOTE — Addendum Note (Signed)
Addended by: Tresa Garter on: 10/11/2022 04:55 PM   Modules accepted: Orders

## 2022-11-11 ENCOUNTER — Encounter: Payer: Self-pay | Admitting: Internal Medicine

## 2022-11-13 ENCOUNTER — Other Ambulatory Visit (INDEPENDENT_AMBULATORY_CARE_PROVIDER_SITE_OTHER): Payer: Medicare Other

## 2022-11-13 DIAGNOSIS — E875 Hyperkalemia: Secondary | ICD-10-CM

## 2022-11-13 LAB — BASIC METABOLIC PANEL
BUN: 25 mg/dL — ABNORMAL HIGH (ref 6–23)
CO2: 28 mEq/L (ref 19–32)
Calcium: 9.6 mg/dL (ref 8.4–10.5)
Chloride: 102 mEq/L (ref 96–112)
Creatinine, Ser: 1.07 mg/dL (ref 0.40–1.20)
GFR: 47.89 mL/min — ABNORMAL LOW (ref >60.00)
Glucose, Bld: 100 mg/dL — ABNORMAL HIGH (ref 70–99)
Potassium: 4.8 mEq/L (ref 3.5–5.1)
Sodium: 137 mEq/L (ref 135–145)

## 2023-03-10 ENCOUNTER — Encounter: Payer: Self-pay | Admitting: Internal Medicine

## 2023-03-10 ENCOUNTER — Ambulatory Visit (INDEPENDENT_AMBULATORY_CARE_PROVIDER_SITE_OTHER): Payer: Medicare Other | Admitting: Internal Medicine

## 2023-03-10 VITALS — BP 118/72 | HR 92 | Temp 98.0°F | Ht 65.0 in | Wt 135.0 lb

## 2023-03-10 DIAGNOSIS — R197 Diarrhea, unspecified: Secondary | ICD-10-CM | POA: Insufficient documentation

## 2023-03-10 DIAGNOSIS — G5702 Lesion of sciatic nerve, left lower limb: Secondary | ICD-10-CM | POA: Diagnosis not present

## 2023-03-10 DIAGNOSIS — Z23 Encounter for immunization: Secondary | ICD-10-CM

## 2023-03-10 MED ORDER — SACCHAROMYCES BOULARDII 250 MG PO CAPS: 250.0000 mg | ORAL_CAPSULE | Freq: Two times a day (BID) | ORAL | 1 refills | Status: DC

## 2023-03-10 MED ORDER — CYCLOBENZAPRINE HCL 5 MG PO TABS: 5.0000 mg | ORAL_TABLET | Freq: Every evening | ORAL | 1 refills | Status: DC | PRN

## 2023-03-10 MED ORDER — METRONIDAZOLE 500 MG PO TABS: 500.0000 mg | ORAL_TABLET | Freq: Three times a day (TID) | ORAL | 0 refills | Status: DC

## 2023-03-10 NOTE — Assessment & Plan Note (Addendum)
Tennis ball massage Stretch Flexeril at hs prn

## 2023-03-10 NOTE — Progress Notes (Signed)
Subjective:  Patient ID: Caroline Olson, female    DOB: Aug 05, 1940  Age: 82 y.o. MRN: [id number]  CC: Back Pain (Started over a week and now in left buttocks ) and Change in bowel movement  (Started a week with gas that started over 2 months )   HPI  Back Pain started over a week and now in left buttocks ) and Change in bowel movement - started a week with gas that started over 2 months - better. Loose stools, gas, constipation. No abd pain   Outpatient Medications Prior to Visit  Medication Sig Dispense Refill   alendronate (FOSAMAX) 70 MG tablet Take 1 tablet (70 mg total) by mouth every 7 (seven) days. Take first thing in am with 6 oz. Water.  Be upright after taking.  Eat nothing for one hour. 4 tablet 12   amLODipine (NORVASC) 5 MG tablet Take 1 tablet (5 mg total) by mouth daily. 90 tablet 3   cyanocobalamin 1000 MCG tablet Take 0.5 tablets (500 mcg total) by mouth every other day. 100 tablet 1   irbesartan (AVAPRO) 150 MG tablet Take 1 tablet (150 mg total) by mouth daily. 90 tablet 3   metronidazole (NORITATE) 1 % cream Apply topically 2 (two) times daily.      Multiple Vitamins-Minerals (MULTIVITAMIN WITH MINERALS) tablet Take 1 tablet by mouth 3 (three) times a week.      Red Yeast Rice Extract 300 MG CAPS Take 1 capsule by mouth daily. 100 capsule 3   VITAMIN D PO Take 1,000 Int'l Units by mouth.     No facility-administered medications prior to visit.    ROS: Review of Systems  Constitutional:  Negative for activity change, appetite change, chills, fatigue and unexpected weight change.  HENT:  Negative for congestion, mouth sores and sinus pressure.   Eyes:  Negative for visual disturbance.  Respiratory:  Negative for cough and chest tightness.   Gastrointestinal:  Positive for constipation and diarrhea. Negative for abdominal pain, anal bleeding, nausea and vomiting.  Genitourinary:  Negative for difficulty urinating, frequency and vaginal pain.  Musculoskeletal:   Negative for back pain and gait problem.  Skin:  Negative for pallor and rash.  Neurological:  Negative for dizziness, tremors, weakness, numbness and headaches.  Psychiatric/Behavioral:  Negative for confusion and sleep disturbance. The patient is not nervous/anxious.     Objective:  BP 118/72   Pulse 92   Temp 98 F (36.7 C) (Oral)   Ht 5\' 5"  (1.651 m)   Wt 135 lb (61.2 kg)   LMP 04/01/1983   SpO2 97%   BMI 22.47 kg/m   BP Readings from Last 3 Encounters:  03/10/23 118/72  10/09/22 130/70  06/26/22 (!) 146/62    Wt Readings from Last 3 Encounters:  03/10/23 135 lb (61.2 kg)  10/09/22 131 lb (59.4 kg)  06/26/22 133 lb 3.2 oz (60.4 kg)    Physical Exam Constitutional:      General: She is not in acute distress.    Appearance: Normal appearance. She is well-developed.  HENT:     Head: Normocephalic.     Right Ear: External ear normal.     Left Ear: External ear normal.     Nose: Nose normal.  Eyes:     General:        Right eye: No discharge.        Left eye: No discharge.     Conjunctiva/sclera: Conjunctivae normal.     Pupils: Pupils are  equal, round, and reactive to light.  Neck:     Thyroid: No thyromegaly.     Vascular: No JVD.     Trachea: No tracheal deviation.  Cardiovascular:     Rate and Rhythm: Normal rate and regular rhythm.     Heart sounds: Normal heart sounds.  Pulmonary:     Effort: No respiratory distress.     Breath sounds: No stridor. No wheezing.  Abdominal:     General: Bowel sounds are normal. There is no distension.     Palpations: Abdomen is soft. There is no mass.     Tenderness: There is no abdominal tenderness. There is no guarding or rebound.  Musculoskeletal:        General: No tenderness.     Cervical back: Normal range of motion and neck supple. No rigidity.  Lymphadenopathy:     Cervical: No cervical adenopathy.  Skin:    Findings: No erythema or rash.  Neurological:     Cranial Nerves: No cranial nerve deficit.      Motor: No abnormal muscle tone.     Coordination: Coordination normal.     Deep Tendon Reflexes: Reflexes normal.  Psychiatric:        Behavior: Behavior normal.        Thought Content: Thought content normal.        Judgment: Judgment normal.     Lab Results  Component Value Date   WBC 4.9 10/09/2022   HGB 11.9 (L) 10/09/2022   HCT 35.1 (L) 10/09/2022   PLT 246.0 10/09/2022   GLUCOSE 100 (H) 11/13/2022   CHOL 206 (H) 10/09/2022   TRIG 59.0 10/09/2022   HDL 85.60 10/09/2022   LDLDIRECT 104.3 06/07/2008   LDLCALC 108 (H) 10/09/2022   ALT 14 10/09/2022   AST 18 10/09/2022   NA 137 11/13/2022   K 4.8 11/13/2022   CL 102 11/13/2022   CREATININE 1.07 11/13/2022   BUN 25 (H) 11/13/2022   CO2 28 11/13/2022   TSH 0.55 10/09/2022   HGBA1C 6.0 09/24/2021    MM 3D SCREEN BREAST BILATERAL  Result Date: 06/04/2022 CLINICAL DATA:  Screening. EXAM: DIGITAL SCREENING BILATERAL MAMMOGRAM WITH TOMOSYNTHESIS AND CAD TECHNIQUE: Bilateral screening digital craniocaudal and mediolateral oblique mammograms were obtained. Bilateral screening digital breast tomosynthesis was performed. The images were evaluated with computer-aided detection. COMPARISON:  Previous exam(s). ACR Breast Density Category c: The breasts are heterogeneously dense, which may obscure small masses. FINDINGS: There are no findings suspicious for malignancy. IMPRESSION: No mammographic evidence of malignancy. A result letter of this screening mammogram will be mailed directly to the patient. RECOMMENDATION: Screening mammogram in one year. (Code:SM-B-01Y) BI-RADS CATEGORY  1: Negative. Electronically Signed   By: Baird Lyons M.D.   On: 06/04/2022 17:42   DG BONE DENSITY (DXA)  Result Date: 06/03/2022 EXAM: DUAL X-RAY ABSORPTIOMETRY (DXA) FOR BONE MINERAL DENSITY IMPRESSION: Referring Physician:  Jerene Bears Your patient completed a bone mineral density test using GE Lunar iDXA system (analysis version: 16). Technologist:   lmn  PATIENT: Name: Carleen, Menzies Patient ID: [id number] Birth Date: Apr 09, 1940 Height: 64.0 in. Sex: Female Measured: 06/03/2022 Weight: 129.0 lbs. Indications: Advanced Age, Breast Cancer History, Caucasian, Estrogen Deficient, osteoarthiritis, Postmenopausal Fractures: None Treatments: Vitamin D (E933.5) ASSESSMENT: The BMD measured at Femur Neck Right is 0.752 g/cm2 with a T-score of -2.1. This patient is considered osteopenic/low bone mass according to World Health Organization [facility]) criteria. The quality of the exam is good. L1 and L3 were  excluded due to degenerative changes. Site Region Measured Date Measured Age YA BMD Significant CHANGE T-score DualFemur Neck Right 06/03/2022 83.4 -2.1 0.752 g/cm2 * DualFemur Neck Right 08/30/2018    79.7         -1.7    0.798 g/cm2 AP Spine  L3-L4      06/03/2022    83.4         -1.4    1.033 g/cm2 AP Spine  L3-L4      08/30/2018    79.7         -1.3    1.044 g/cm2 DualFemur Total Mean 06/03/2022    83.4         -1.3    0.850 g/cm2 DualFemur Total Mean 08/30/2018    79.7         -1.2    0.856 g/cm2 World Health Organization [facility]) criteria for post-menopausal, Caucasian Women: Normal       T-score at or above -1 SD Osteopenia   T-score between -1 and -2.5 SD Osteoporosis T-score at or below -2.5 SD RECOMMENDATION: 1. All patients should optimize calcium and vitamin D intake. 2. Consider FDA-approved medical therapies in postmenopausal women and men aged 64 years and older, based on the following: a. A hip or vertebral (clinical or morphometric) fracture. b. T-score = -2.5 at the femoral neck or spine after appropriate evaluation to exclude secondary causes. c. Low bone mass (T-score between -1.0 and -2.5 at the femoral neck or spine) and a 10-year probability of a hip fracture = 3% or a 10-year probability of a major osteoporosis-related fracture = 20% based on the [place]-adapted WHO algorithm. d. Clinician judgment and/or patient preferences may indicate treatment for people with  10-year fracture probabilities above or below these levels. FOLLOW-UP: Patients with diagnosis of osteoporosis or at high risk for fracture should have regular bone mineral density tests.? Patients eligible for Medicare are allowed routine testing every 2 years.? The testing frequency can be increased to one year for patients who have rapidly progressing disease, are receiving or discontinuing medical therapy to restore bone mass, or have additional risk factors. I have reviewed this study and agree with the findings. [facility] FRAX* 10-year Probability of Fracture Based on femoral neck BMD: DualFemur (Right) Major Osteoporotic Fracture: 14.9% Hip Fracture:                5.0% Population:                  [place] (Caucasian) Risk Factors:                None *FRAX is a Armed forces logistics/support/administrative officer of the [employer] for Metabolic Bone Disease, a World Science writer (WHO) [employer]. ASSESSMENT: The probability of a major osteoporotic fracture is 14.9% within the next ten years. The probability of a hip fracture is 5.0% within the next ten years. Electronically Signed   By: Bary Richard M.D.   On: 06/03/2022 10:59    Assessment & Plan:   Problem List Items Addressed This Visit     Piriformis syndrome of left side - Primary    Tennis ball massage Stretch Flexeril at hs prn       Relevant Medications   cyclobenzaprine (FLEXERIL) 5 MG tablet   Diarrhea    Flagyl x 7 d Florastor x 1 month Last colon 2019      Other Visit Diagnoses     Need for influenza vaccination  Relevant Orders   Flu Vaccine Trivalent High Dose (Fluad) (Completed)         Meds ordered this encounter  Medications   metroNIDAZOLE (FLAGYL) 500 MG tablet    Sig: Take 1 tablet (500 mg total) by mouth 3 (three) times daily.    Dispense:  21 tablet    Refill:  0   saccharomyces boulardii (FLORASTOR) 250 MG capsule    Sig: Take 1 capsule (250 mg total) by mouth 2  (two) times daily.    Dispense:  60 capsule    Refill:  1   cyclobenzaprine (FLEXERIL) 5 MG tablet    Sig: Take 1 tablet (5 mg total) by mouth at bedtime as needed for muscle spasms.    Dispense:  30 tablet    Refill:  1      Follow-up: Return in about 4 weeks (around 04/07/2023) for a follow-up visit.  Sonda Primes, MD

## 2023-03-10 NOTE — Assessment & Plan Note (Signed)
Flagyl x 7 d Florastor x 1 month Last colon 2019

## 2023-04-07 ENCOUNTER — Encounter: Payer: Self-pay | Admitting: Internal Medicine

## 2023-04-07 ENCOUNTER — Ambulatory Visit (INDEPENDENT_AMBULATORY_CARE_PROVIDER_SITE_OTHER): Payer: Medicare Other | Admitting: Internal Medicine

## 2023-04-07 VITALS — BP 122/70 | HR 76 | Temp 97.7°F | Ht 65.0 in | Wt 131.0 lb

## 2023-04-07 DIAGNOSIS — R197 Diarrhea, unspecified: Secondary | ICD-10-CM | POA: Diagnosis not present

## 2023-04-07 DIAGNOSIS — M8580 Other specified disorders of bone density and structure, unspecified site: Secondary | ICD-10-CM | POA: Diagnosis not present

## 2023-04-07 DIAGNOSIS — R112 Nausea with vomiting, unspecified: Secondary | ICD-10-CM | POA: Insufficient documentation

## 2023-04-07 LAB — CBC WITH DIFFERENTIAL/PLATELET
Basophils Absolute: 0 10*3/uL (ref 0.0–0.1)
Basophils Relative: 0.4 % (ref 0.0–3.0)
Eosinophils Absolute: 0.1 10*3/uL (ref 0.0–0.7)
Eosinophils Relative: 1.7 % (ref 0.0–5.0)
HCT: 36.8 % (ref 36.0–46.0)
Hemoglobin: 12.6 g/dL (ref 12.0–15.0)
Lymphocytes Relative: 16.7 % (ref 12.0–46.0)
Lymphs Abs: 1.1 10*3/uL (ref 0.7–4.0)
MCHC: 34.1 g/dL (ref 30.0–36.0)
MCV: 90.7 fL (ref 78.0–100.0)
Monocytes Absolute: 0.5 10*3/uL (ref 0.1–1.0)
Monocytes Relative: 7.1 % (ref 3.0–12.0)
Neutro Abs: 4.9 10*3/uL (ref 1.4–7.7)
Neutrophils Relative %: 74.1 % (ref 43.0–77.0)
Platelets: 288 10*3/uL (ref 150.0–400.0)
RBC: 4.06 Mil/uL (ref 3.87–5.11)
RDW: 12.8 % (ref 11.5–15.5)
WBC: 6.6 10*3/uL (ref 4.0–10.5)

## 2023-04-07 LAB — URINALYSIS, ROUTINE W REFLEX MICROSCOPIC
Bilirubin Urine: NEGATIVE
Hgb urine dipstick: NEGATIVE
Ketones, ur: NEGATIVE
Nitrite: NEGATIVE
Specific Gravity, Urine: 1.01 (ref 1.000–1.030)
Urine Glucose: NEGATIVE
Urobilinogen, UA: 0.2 (ref 0.0–1.0)
pH: 6 (ref 5.0–8.0)

## 2023-04-07 LAB — COMPREHENSIVE METABOLIC PANEL
ALT: 21 U/L (ref 0–35)
AST: 20 U/L (ref 0–37)
Albumin: 4.5 g/dL (ref 3.5–5.2)
Alkaline Phosphatase: 63 U/L (ref 39–117)
BUN: 24 mg/dL — ABNORMAL HIGH (ref 6–23)
CO2: 29 meq/L (ref 19–32)
Calcium: 10.6 mg/dL — ABNORMAL HIGH (ref 8.4–10.5)
Chloride: 100 meq/L (ref 96–112)
Creatinine, Ser: 1.02 mg/dL (ref 0.40–1.20)
GFR: 50.58 mL/min — ABNORMAL LOW (ref >60.00)
Glucose, Bld: 100 mg/dL — ABNORMAL HIGH (ref 70–99)
Potassium: 4.7 meq/L (ref 3.5–5.1)
Sodium: 136 meq/L (ref 135–145)
Total Bilirubin: 0.4 mg/dL (ref 0.2–1.2)
Total Protein: 7.2 g/dL (ref 6.0–8.3)

## 2023-04-07 LAB — LIPASE: Lipase: 59 U/L (ref 11.0–59.0)

## 2023-04-07 MED ORDER — PANTOPRAZOLE SODIUM 40 MG PO TBEC: 40.0000 mg | DELAYED_RELEASE_TABLET | Freq: Every day | ORAL | 3 refills | Status: DC

## 2023-04-07 NOTE — Assessment & Plan Note (Signed)
Labs Imodium prn

## 2023-04-07 NOTE — Patient Instructions (Signed)
 Hold Fossamax

## 2023-04-07 NOTE — Progress Notes (Signed)
 Subjective:  Patient ID: Ronal FORBES Olmsted, female    DOB: [birth year]  Age: 83 y.o. MRN: [id number]  CC: Medical Management of Chronic Issues (4 WEEK F/U)   HPI SHANICE POZNANSKI presents for piriformis pain - better C/o recurrent vomiting x 2 episodes Sister had GS's Diarrhea is better On Fosamax  x 1 year  Outpatient Medications Prior to Visit  Medication Sig Dispense Refill   alendronate  (FOSAMAX ) 70 MG tablet Take 1 tablet (70 mg total) by mouth every 7 (seven) days. Take first thing in am with 6 oz. Water.  Be upright after taking.  Eat nothing for one hour. 4 tablet 12   amLODipine  (NORVASC ) 5 MG tablet Take 1 tablet (5 mg total) by mouth daily. 90 tablet 3   cyanocobalamin  1000 MCG tablet Take 0.5 tablets (500 mcg total) by mouth every other day. 100 tablet 1   cyclobenzaprine  (FLEXERIL ) 5 MG tablet Take 1 tablet (5 mg total) by mouth at bedtime as needed for muscle spasms. 30 tablet 1   irbesartan  (AVAPRO ) 150 MG tablet Take 1 tablet (150 mg total) by mouth daily. 90 tablet 3   Multiple Vitamins-Minerals (MULTIVITAMIN WITH MINERALS) tablet Take 1 tablet by mouth 3 (three) times a week.      Red Yeast Rice Extract 300 MG CAPS Take 1 capsule by mouth daily. 100 capsule 3   saccharomyces boulardii (FLORASTOR) 250 MG capsule Take 1 capsule (250 mg total) by mouth 2 (two) times daily. 60 capsule 1   VITAMIN D  PO Take 1,000 Int'l Units by mouth.     metroNIDAZOLE  (FLAGYL ) 500 MG tablet Take 1 tablet (500 mg total) by mouth 3 (three) times daily. 21 tablet 0   metronidazole  (NORITATE ) 1 % cream Apply topically 2 (two) times daily.      No facility-administered medications prior to visit.    ROS: Review of Systems  Constitutional:  Positive for fatigue. Negative for activity change, appetite change, chills and unexpected weight change.  HENT:  Negative for congestion, mouth sores and sinus pressure.   Eyes:  Negative for visual disturbance.  Respiratory:  Negative for cough and chest  tightness.   Gastrointestinal:  Positive for diarrhea, nausea and vomiting. Negative for abdominal distention and abdominal pain.  Genitourinary:  Negative for difficulty urinating, frequency and vaginal pain.  Musculoskeletal:  Negative for back pain and gait problem.  Skin:  Negative for pallor and rash.  Neurological:  Negative for dizziness, tremors, weakness, numbness and headaches.  Psychiatric/Behavioral:  Negative for confusion and sleep disturbance.     Objective:  BP 122/70 (BP Location: Right Arm, Patient Position: Sitting, Cuff Size: Normal)   Pulse 76   Temp 97.7 F (36.5 C) (Oral)   Ht 5' 5 (1.651 m)   Wt 131 lb (59.4 kg)   LMP 04/01/1983   SpO2 98%   BMI 21.80 kg/m   BP Readings from Last 3 Encounters:  04/07/23 122/70  03/10/23 118/72  10/09/22 130/70    Wt Readings from Last 3 Encounters:  04/07/23 131 lb (59.4 kg)  03/10/23 135 lb (61.2 kg)  10/09/22 131 lb (59.4 kg)    Physical Exam Constitutional:      General: She is not in acute distress.    Appearance: Normal appearance. She is well-developed.  HENT:     Head: Normocephalic.     Right Ear: External ear normal.     Left Ear: External ear normal.     Nose: Nose normal.  Eyes:  General:        Right eye: No discharge.        Left eye: No discharge.     Conjunctiva/sclera: Conjunctivae normal.     Pupils: Pupils are equal, round, and reactive to light.  Neck:     Thyroid : No thyromegaly.     Vascular: No JVD.     Trachea: No tracheal deviation.  Cardiovascular:     Rate and Rhythm: Normal rate and regular rhythm.     Heart sounds: Normal heart sounds.  Pulmonary:     Effort: No respiratory distress.     Breath sounds: No stridor. No wheezing.  Abdominal:     General: Bowel sounds are normal. There is no distension.     Palpations: Abdomen is soft. There is no mass.     Tenderness: There is no abdominal tenderness. There is no guarding or rebound.  Musculoskeletal:        General:  No tenderness.     Cervical back: Normal range of motion and neck supple. No rigidity.  Lymphadenopathy:     Cervical: No cervical adenopathy.  Skin:    Findings: No erythema or rash.  Neurological:     Cranial Nerves: No cranial nerve deficit.     Motor: No abnormal muscle tone.     Coordination: Coordination normal.     Deep Tendon Reflexes: Reflexes normal.  Psychiatric:        Behavior: Behavior normal.        Thought Content: Thought content normal.        Judgment: Judgment normal.   Abd S/NT No HSM  Lab Results  Component Value Date   WBC 4.9 10/09/2022   HGB 11.9 (L) 10/09/2022   HCT 35.1 (L) 10/09/2022   PLT 246.0 10/09/2022   GLUCOSE 100 (H) 11/13/2022   CHOL 206 (H) 10/09/2022   TRIG 59.0 10/09/2022   HDL 85.60 10/09/2022   LDLDIRECT 104.3 06/07/2008   LDLCALC 108 (H) 10/09/2022   ALT 14 10/09/2022   AST 18 10/09/2022   NA 137 11/13/2022   K 4.8 11/13/2022   CL 102 11/13/2022   CREATININE 1.07 11/13/2022   BUN 25 (H) 11/13/2022   CO2 28 11/13/2022   TSH 0.55 10/09/2022   HGBA1C 6.0 09/24/2021    MM 3D SCREEN BREAST BILATERAL Result Date: 06/04/2022 CLINICAL DATA:  Screening. EXAM: DIGITAL SCREENING BILATERAL MAMMOGRAM WITH TOMOSYNTHESIS AND CAD TECHNIQUE: Bilateral screening digital craniocaudal and mediolateral oblique mammograms were obtained. Bilateral screening digital breast tomosynthesis was performed. The images were evaluated with computer-aided detection. COMPARISON:  Previous exam(s). ACR Breast Density Category c: The breasts are heterogeneously dense, which may obscure small masses. FINDINGS: There are no findings suspicious for malignancy. IMPRESSION: No mammographic evidence of malignancy. A result letter of this screening mammogram will be mailed directly to the patient. RECOMMENDATION: Screening mammogram in one year. (Code:SM-B-01Y) BI-RADS CATEGORY  1: Negative. Electronically Signed   By: Dina  Arceo M.D.   On: 06/04/2022 17:42   DG BONE  DENSITY (DXA) Result Date: 06/03/2022 EXAM: DUAL X-RAY ABSORPTIOMETRY (DXA) FOR BONE MINERAL DENSITY IMPRESSION: Referring Physician:  RONAL GORMAN PINAL Your patient completed a bone mineral density test using GE Lunar iDXA system (analysis version: 16). Technologist:   lmn PATIENT: Name: Kora, Groom Patient ID: [id number] Birth Date: 1940/04/03 Height: 64.0 in. Sex: Female Measured: 06/03/2022 Weight: 129.0 lbs. Indications: Advanced Age, Breast Cancer History, Caucasian, Estrogen Deficient, osteoarthiritis, Postmenopausal Fractures: None Treatments: Vitamin D  (E933.5) ASSESSMENT: The BMD  measured at Femur Neck Right is 0.752 g/cm2 with a T-score of -2.1. This patient is considered osteopenic/low bone mass according to World Health Organization [facility]) criteria. The quality of the exam is good. L1 and L3 were excluded due to degenerative changes. Site Region Measured Date Measured Age YA BMD Significant CHANGE T-score DualFemur Neck Right 06/03/2022 83.4 -2.1 0.752 g/cm2 * DualFemur Neck Right 08/30/2018    79.7         -1.7    0.798 g/cm2 AP Spine  L3-L4      06/03/2022    83.4         -1.4    1.033 g/cm2 AP Spine  L3-L4      08/30/2018    79.7         -1.3    1.044 g/cm2 DualFemur Total Mean 06/03/2022    83.4         -1.3    0.850 g/cm2 DualFemur Total Mean 08/30/2018    79.7         -1.2    0.856 g/cm2 World Health Organization [facility]) criteria for post-menopausal, Caucasian Women: Normal       T-score at or above -1 SD Osteopenia   T-score between -1 and -2.5 SD Osteoporosis T-score at or below -2.5 SD RECOMMENDATION: 1. All patients should optimize calcium and vitamin D  intake. 2. Consider FDA-approved medical therapies in postmenopausal women and men aged 65 years and older, based on the following: a. A hip or vertebral (clinical or morphometric) fracture. b. T-score = -2.5 at the femoral neck or spine after appropriate evaluation to exclude secondary causes. c. Low bone mass (T-score between -1.0 and -2.5 at the  femoral neck or spine) and a 10-year probability of a hip fracture = 3% or a 10-year probability of a major osteoporosis-related fracture = 20% based on the [place] -adapted WHO algorithm. d. Clinician judgment and/or patient preferences may indicate treatment for people with 10-year fracture probabilities above or below these levels. FOLLOW-UP: Patients with diagnosis of osteoporosis or at high risk for fracture should have regular bone mineral density tests.? Patients eligible for Medicare are allowed routine testing every 2 years.? The testing frequency can be increased to one year for patients who have rapidly progressing disease, are receiving or discontinuing medical therapy to restore bone mass, or have additional risk factors. I have reviewed this study and agree with the findings. [facility] FRAX* 10-year Probability of Fracture Based on femoral neck BMD: DualFemur (Right) Major Osteoporotic Fracture: 14.9% Hip Fracture:                5.0% Population:                  [place]  (Caucasian) Risk Factors:                None *FRAX is a armed forces logistics/support/administrative officer of the [employer] for Metabolic Bone Disease, a World Science Writer (WHO) [employer]. ASSESSMENT: The probability of a major osteoporotic fracture is 14.9% within the next ten years. The probability of a hip fracture is 5.0% within the next ten years. Electronically Signed   By: Lael Hines M.D.   On: 06/03/2022 10:59    Assessment & Plan:   Problem List Items Addressed This Visit     Osteopenia   Diarrhea   Labs Imodium prn       Relevant Orders   Ambulatory referral to Gastroenterology   Nausea & vomiting -  Primary   Not better Hold Fossamax Abd US  GI ref - Dr Avram Start Protonix  every day 40 mg Last colon 2019 Labs ordered      Relevant Orders   US  Abdomen Complete   Comprehensive metabolic panel   CBC with Differential/Platelet   Urinalysis   Lipase   Ambulatory referral  to Gastroenterology      Meds ordered this encounter  Medications   pantoprazole  (PROTONIX ) 40 MG tablet    Sig: Take 1 tablet (40 mg total) by mouth daily.    Dispense:  30 tablet    Refill:  3      Follow-up: Return in about 2 months (around 06/05/2023) for a follow-up visit.  Marolyn Noel, MD

## 2023-04-07 NOTE — Assessment & Plan Note (Signed)
 Not better Hold Fossamax Abd [place] GI ref - Dr Leone Payor Start Protonix every day 40 mg Last colon 2019 Labs ordered

## 2023-04-09 ENCOUNTER — Ambulatory Visit
Admission: RE | Admit: 2023-04-09 | Discharge: 2023-04-09 | Disposition: A | Discharge status: Home / Self Care | Payer: Medicare Other | Source: Ambulatory Visit | Attending: Internal Medicine | Admitting: Internal Medicine

## 2023-04-09 ENCOUNTER — Encounter: Payer: Self-pay | Admitting: Internal Medicine

## 2023-04-10 ENCOUNTER — Other Ambulatory Visit: Payer: Medicare Other

## 2023-04-10 ENCOUNTER — Encounter: Payer: Self-pay | Admitting: Internal Medicine

## 2023-04-15 ENCOUNTER — Ambulatory Visit: Payer: Medicare Other | Admitting: Internal Medicine

## 2023-04-15 ENCOUNTER — Encounter: Payer: Self-pay | Admitting: Internal Medicine

## 2023-04-15 VITALS — BP 120/64 | HR 88 | Ht 64.0 in | Wt 132.0 lb

## 2023-04-15 DIAGNOSIS — R112 Nausea with vomiting, unspecified: Secondary | ICD-10-CM

## 2023-04-15 DIAGNOSIS — R194 Change in bowel habit: Secondary | ICD-10-CM

## 2023-04-15 NOTE — Patient Instructions (Signed)
 Glad you are feeling better.  _______________________________________________________  If your blood pressure at your visit was 140/90 or greater, please contact your primary care physician to follow up on this.  _______________________________________________________  If you are age 85 or older, your body mass index should be between 23-30. Your Body mass index is 22.66 kg/m. If this is out of the aforementioned range listed, please consider follow up with your Primary Care Provider.  If you are age 98 or younger, your body mass index should be between 19-25. Your Body mass index is 22.66 kg/m. If this is out of the aformentioned range listed, please consider follow up with your Primary Care Provider.   ________________________________________________________  The [place] [facility] providers would like to encourage you to use MYCHART to communicate with providers for non-urgent requests or questions.  Due to long hold times on the telephone, sending your provider a message by [facility] may be a faster and more efficient way to get a response.  Please allow 48 business hours for a response.  Please remember that this is for non-urgent requests.  _______________________________________________________  I appreciate the opportunity to care for you. Loy Ruff, MD, [facility]

## 2023-04-15 NOTE — Progress Notes (Signed)
 Caroline Olson 83 y.o. Nov 21, 1940 [id number]  Assessment & Plan:   Encounter Diagnoses  Name Primary?   Nausea and vomiting, unspecified vomiting type Yes   Altered bowel habits transient    She is improved at this point.  We have elected no additional workup.  She will monitor for recurrent symptoms if she has them with the nausea and vomiting would likely pursue an EGD.    Bowel habits have returned to normal we will monitor this.  Higher threshold to investigate with colonoscopy at age 29 but she is fit for her age.  She knows to contact me in the interim for symptom recurrence.  Agree with holding Fosamax  and treating with pantoprazole  for the time being and she sees Dr. Georgia Kipper in follow-up in March.   CC: Plotnikov, Oakley Bellman, MD   Subjective:   Chief Complaint: Nausea and vomiting and altered bowel habits  HPI 83 year old white woman with a history of colon polyps, bleeding hemorrhoids, presents with a recent episode of vomiting and changes in bowel habits. The vomiting occurred twice, once in mid-December and again on New Year's Day, with no apparent trigger. This was unusual for the patient, who had not experienced vomiting in over fifty years.  In addition to vomiting, the patient reported a significant decrease in bowel movements between Thanksgiving and Christmas, with only small, irregularly shaped droplets of stool being passed. This was a change from the patient's usual bowel habits, which, while not regular, typically involved the passing of small balls of stool.  The patient also reported a recent diagnosis of piriformis syndrome, for which she was prescribed a muscle relaxer. Around the same time, the patient stopped taking Fosamax , a medication she had been on for a year, due to the onset of these symptoms and at the recommendation of Dr. Georgia Kipper.  Despite these issues, the patient reported feeling much better at the time of the consultation, with regular  bowel movements resuming over the past few days. The patient also noted a slight weight loss of about five pounds, which has since started to rebound.  The patient denied any changes in diet or other medication around the time these symptoms started, and also denied experiencing any pain. However, she did report an increase in gas over the past few months, which was noticeable mainly in the mornings and occasionally during physical activity.  The patient's mother had a history of pancreatic cancer diagnosed at age 75 , which raised some concern for the patient. However, she reported no pain or other symptoms typically associated with this condition. The patient remains active, participating in activities such as pickleball and hiking.  Wt Readings from Last 3 Encounters:  04/15/23 132 lb (59.9 kg)  04/07/23 131 lb (59.4 kg)  03/10/23 135 lb (61.2 kg)   She saw Dr. Georgia Kipper for these issues on April 07, 2023 and had workup as below plus received 7 days of metronidazole  and was recommended to take Florastor for 1 month.  He also started Protonix  40 mg daily.  US  04/09/23 IMPRESSION: Slight limitation of the pancreas due to overlapping bowel gas and soft tissue. The portions that are seen are preserved. However if there is further concern specifically of the pancreas additional cross-sectional imaging may be of some benefit such as CT or MRI.   Minimal ectasia of the intrahepatic biliary ducts. Heterogeneous hepatic parenchyma.  There was some gallbladder sludge but no stones    Chemistry      Component Value  Date/Time   NA 136 04/07/2023 1136   NA 142 04/07/2017 1319   NA 135 (L) 04/03/2017 1454   K 4.7 04/07/2023 1136   K 4.8 04/03/2017 1454   CL 100 04/07/2023 1136   CL 102 01/07/2012 1307   CO2 29 04/07/2023 1136   CO2 28 04/03/2017 1454   BUN 24 (H) 04/07/2023 1136   BUN 19 04/07/2017 1319   BUN 25.3 04/03/2017 1454   CREATININE 1.02 04/07/2023 1136   CREATININE 1.14 (H)  07/17/2017 0933   CREATININE 1.1 04/03/2017 1454      Component Value Date/Time   CALCIUM 10.6 (H) 04/07/2023 1136   CALCIUM 9.6 04/03/2017 1454   ALKPHOS 63 04/07/2023 1136   ALKPHOS 82 04/03/2017 1454   AST 20 04/07/2023 1136   AST 15 07/17/2017 0933   AST 17 04/03/2017 1454   ALT 21 04/07/2023 1136   ALT 15 07/17/2017 0933   ALT 15 04/03/2017 1454   BILITOT 0.4 04/07/2023 1136   BILITOT 0.4 07/17/2017 0933   BILITOT 0.49 04/03/2017 1454     Lab Results  Component Value Date   WBC 6.6 04/07/2023   HGB 12.6 04/07/2023   HCT 36.8 04/07/2023   MCV 90.7 04/07/2023   PLT 288.0 04/07/2023   Lab Results  Component Value Date   LIPASE 59.0 04/07/2023   Urinalysis did show some leukocytes 7-10 and moderate on the dip but she did not have any urinary symptoms   03/04/18 colonoscopy - Three diminutive polyps in the sigmoid colon and in the ascending colon, removed with a cold biopsy forceps. Resected and retrieved. - The examination was otherwise normal on direct and retroflexion views.  Polyps were adenomas   No previous EGD Allergies  Allergen Reactions   Aspirin  Other (See Comments)    REACTION: ringing in ears   Codeine Sulfate Nausea And Vomiting   Current Meds  Medication Sig   alendronate  (FOSAMAX ) 70 MG tablet Take 1 tablet (70 mg total) by mouth every 7 (seven) days. Take first thing in am with 6 oz. Water.  Be upright after taking.  Eat nothing for one hour.   amLODipine  (NORVASC ) 5 MG tablet Take 1 tablet (5 mg total) by mouth daily.   cyanocobalamin  1000 MCG tablet Take 0.5 tablets (500 mcg total) by mouth every other day.   cyclobenzaprine  (FLEXERIL ) 5 MG tablet Take 1 tablet (5 mg total) by mouth at bedtime as needed for muscle spasms.   irbesartan  (AVAPRO ) 150 MG tablet Take 1 tablet (150 mg total) by mouth daily.   Multiple Vitamins-Minerals (MULTIVITAMIN WITH MINERALS) tablet Take 1 tablet by mouth 3 (three) times a week.    pantoprazole  (PROTONIX ) 40 MG  tablet Take 1 tablet (40 mg total) by mouth daily.   Red Yeast Rice Extract 300 MG CAPS Take 1 capsule by mouth daily.   saccharomyces boulardii (FLORASTOR) 250 MG capsule Take 1 capsule (250 mg total) by mouth 2 (two) times daily.   VITAMIN D  PO Take 1,000 Int'l Units by mouth.   Past Medical History:  Diagnosis Date   Adenomatous colon polyp 2014   Allergy    Anemia    Arthritis    Breast cancer (HCC) 2011   DCIF   Hypertension    Lichen simplex chronicus 05/09/2022   on vulvar biopsy   Osteopenia    Personal history of radiation therapy    Varicose veins    Past Surgical History:  Procedure Laterality Date   ABDOMINAL HYSTERECTOMY  1985   APPENDECTOMY  1956   pt states appendix was on left side not rt. per surgeon   BICEPT TENODESIS Right    tendon repair   BREAST LUMPECTOMY Left 2011   left, reexcision 1/12   CATARACT EXTRACTION, BILATERAL     COLONOSCOPY     COLONOSCOPY W/ POLYPECTOMY  2014   ENDOVENOUS ABLATION SAPHENOUS VEIN W/ LASER Left 01/27/2013   left greater saphenous vein and sclerotherapy left leg by Ouida Bloom MD   ENDOVENOUS ABLATION SAPHENOUS VEIN W/ LASER Right 02/10/2013   right greater saphenous vein and sclerotherapy right leg by Ouida Bloom MD   POLYPECTOMY     ROTATOR CUFF REPAIR Right 2009   stab phlebectomy Left 07/21/2013   10-20 incisions left leg by Ouida Bloom MD   Social History   Social History Narrative   Patient is married she is retired and has 2 daughters   Some alcohol use no tobacco or drug use   family history includes Breast cancer in her maternal aunt; Diabetes in her father and maternal grandfather; Heart disease in her father; Hypertension in her mother and sister; Other in her mother; Pancreatic cancer in her mother; Tremor in her sister.   Review of Systems As per HPI  Some back pain and urinary leakage and muscle cramps at times  All other review of systems negative  Objective:   Physical Exam @BP  120/64 (BP  Location: Left Arm, Patient Position: Sitting)   Pulse 88   Ht 5\' 4"  (1.626 m) Comment: height measured without shoes  Wt 132 lb (59.9 kg)   LMP 04/01/1983   BMI 22.66 kg/m @  General:  Well-developed, well-nourished and in no acute distress Eyes:  anicteric. Neck:   supple w/o thyromegaly or mass.  Lungs: Clear to auscultation bilaterally. Heart:   S1S2, no rubs, murmurs, gallops. Abdomen:  soft, non-tender, no hepatosplenomegaly, hernia, or mass and BS+.  Lymph:  no cervical or supraclavicular adenopathy. Extremities:   no edema,  Neuro:  A&O x 3.  Psych:  appropriate mood and  Affect.   Data Reviewed: See HPI

## 2023-05-07 ENCOUNTER — Ambulatory Visit: Payer: Self-pay | Admitting: Internal Medicine

## 2023-05-07 NOTE — Telephone Encounter (Signed)
 Copied from CRM [id number]. Topic: Clinical - Medical Advice >> May 07, 2023 11:42 AM Leila BROCKS wrote: Reason for CRM: Patient [phone] states had a labs and urine show possible UTI and Dr. Garald advised patient to contact him if she develops symtoms. Pt states symptoms of urinating frequency, burning sensation and blood in urine. Patient denies any pain, or fever. Patient would like rx sent to [facility] - [place], [place] - [address] [place] [address] Phone: [phone] Fax: [phone]  Chief Complaint: Urinary symptoms Symptoms: Frequency, urgency, blood in urine, burning Frequency: 2 days Pertinent Negatives: Patient denies flank pain, fever and urinary retention Disposition: [] ED /[] Urgent Care (no appt availability in office) / [] Appointment(In office/virtual)/ []  [facility]/ [] Home Care/ [] Refused Recommended Disposition /[] [facility]/ [x]  Follow-up with PCP Additional Notes: Patient called in to report symptoms of a UTI. Patient stated she is experiencing frequency, urgency, blood in urine, and burning while urinating. Patient denied fever, flank pain and urinary retention. Patient stated that she was seen by Dr. Garald in the office on 04/07/23. Patient had a urinalysis done and it reflected positive results. Patient was not experiencing urinary symptoms at the time of this visit. Patient stated that she was instructed to let her provider know if urinary symptoms developed, so that a medication could be called in. Patient is requesting medication at this time. This RN advised the patient that I would route this conversation to the clinic, for their discretion. This RN advised patient to call back if symptoms worsen. Patient complied.   Reason for Disposition  Urinating more frequently than usual (i.e., frequency)  Answer Assessment - Initial Assessment Questions 1. SYMPTOM: What's the  main symptom you're concerned about? (e.g., frequency, incontinence)     Frequency, urgency, blood in urine, and burning while urinating 2. ONSET: When did the urinary symptoms  start?     A couple of days ago 3. PAIN: Is there any pain? If Yes, ask: How bad is it? (Scale: 1-10; mild, moderate, severe)     States she is having pain while urinating 4. CAUSE: What do you think is causing the symptoms?     UTI 5. OTHER SYMPTOMS: Do you have any other symptoms? (e.g., blood in urine, fever, flank pain, pain with urination)     Denies flank pain, denies fever  Protocols used: Urinary Symptoms-A-AH

## 2023-05-10 MED ORDER — CEPHALEXIN 500 MG PO CAPS: 500.0000 mg | ORAL_CAPSULE | Freq: Three times a day (TID) | ORAL | 0 refills | Status: DC

## 2023-05-10 NOTE — Telephone Encounter (Signed)
 Noted.  Keflex  prescription was emailed to the pharmacy.  Thanks

## 2023-05-10 NOTE — Addendum Note (Signed)
 Addended by: Zanyah Lentsch V on: 05/10/2023 11:07 PM   Modules accepted: Orders

## 2023-05-11 NOTE — Telephone Encounter (Signed)
 I was able to inform the pt of medication being sent in.

## 2023-05-29 ENCOUNTER — Other Ambulatory Visit: Payer: Self-pay | Admitting: Internal Medicine

## 2023-05-29 DIAGNOSIS — Z1231 Encounter for screening mammogram for malignant neoplasm of breast: Secondary | ICD-10-CM

## 2023-06-08 ENCOUNTER — Ambulatory Visit
Admission: RE | Admit: 2023-06-08 | Discharge: 2023-06-08 | Disposition: A | Discharge status: Home / Self Care | Payer: Medicare Other | Source: Ambulatory Visit | Attending: Internal Medicine | Admitting: Internal Medicine

## 2023-06-08 DIAGNOSIS — Z1231 Encounter for screening mammogram for malignant neoplasm of breast: Secondary | ICD-10-CM

## 2023-06-09 ENCOUNTER — Encounter: Payer: Self-pay | Admitting: Internal Medicine

## 2023-06-09 ENCOUNTER — Ambulatory Visit: Payer: Medicare Other | Admitting: Internal Medicine

## 2023-06-09 VITALS — BP 122/66 | HR 70 | Temp 98.4°F | Ht 64.0 in | Wt 135.0 lb

## 2023-06-09 DIAGNOSIS — R112 Nausea with vomiting, unspecified: Secondary | ICD-10-CM | POA: Diagnosis not present

## 2023-06-09 DIAGNOSIS — M8580 Other specified disorders of bone density and structure, unspecified site: Secondary | ICD-10-CM | POA: Diagnosis not present

## 2023-06-09 DIAGNOSIS — I10 Essential (primary) hypertension: Secondary | ICD-10-CM

## 2023-06-09 DIAGNOSIS — Z0000 Encounter for general adult medical examination without abnormal findings: Secondary | ICD-10-CM

## 2023-06-09 MED ORDER — CALCITONIN (SALMON) 200 UNIT/ACT NA SOLN
1.0000 | Freq: Every day | NASAL | 11 refills | Status: DC
Start: 2023-06-09 — End: 2023-10-20

## 2023-06-09 NOTE — Progress Notes (Signed)
 Subjective:  Patient ID: Caroline Olson, female    DOB: 02/18/1941  Age: 83 y.o. MRN: [id number]  CC: Medical Management of Chronic Issues (2 mnth f/u)   HPI Caroline Olson presents for GI upset, osteoporosis, HTN  Outpatient Medications Prior to Visit  Medication Sig Dispense Refill   amLODipine (NORVASC) 5 MG tablet Take 1 tablet (5 mg total) by mouth daily. 90 tablet 3   cyanocobalamin 1000 MCG tablet Take 0.5 tablets (500 mcg total) by mouth every other day. 100 tablet 1   cyclobenzaprine (FLEXERIL) 5 MG tablet Take 1 tablet (5 mg total) by mouth at bedtime as needed for muscle spasms. 30 tablet 1   irbesartan (AVAPRO) 150 MG tablet Take 1 tablet (150 mg total) by mouth daily. 90 tablet 3   Multiple Vitamins-Minerals (MULTIVITAMIN WITH MINERALS) tablet Take 1 tablet by mouth 3 (three) times a week.      pantoprazole (PROTONIX) 40 MG tablet Take 1 tablet (40 mg total) by mouth daily. 30 tablet 3   Red Yeast Rice Extract 300 MG CAPS Take 1 capsule by mouth daily. 100 capsule 3   saccharomyces boulardii (FLORASTOR) 250 MG capsule Take 1 capsule (250 mg total) by mouth 2 (two) times daily. 60 capsule 1   VITAMIN D PO Take 1,000 Int'l Units by mouth.     cephALEXin (KEFLEX) 500 MG capsule Take 1 capsule (500 mg total) by mouth 3 (three) times daily. (Patient not taking: Reported on 06/09/2023) 21 capsule 0   No facility-administered medications prior to visit.    ROS: Review of Systems  Constitutional:  Negative for activity change, appetite change, chills, fatigue and unexpected weight change.  HENT:  Negative for congestion, mouth sores and sinus pressure.   Eyes:  Negative for visual disturbance.  Respiratory:  Negative for cough and chest tightness.   Gastrointestinal:  Negative for abdominal pain and nausea.  Genitourinary:  Negative for difficulty urinating, frequency and vaginal pain.  Musculoskeletal:  Negative for back pain and gait problem.  Skin:  Negative for pallor and  rash.  Neurological:  Negative for dizziness, tremors, weakness, numbness and headaches.  Psychiatric/Behavioral:  Negative for confusion, sleep disturbance and suicidal ideas. The patient is not nervous/anxious.     Objective:  BP 122/66   Pulse 70   Temp 98.4 F (36.9 C) (Oral)   Ht 5\' 4"  (1.626 m)   Wt 135 lb (61.2 kg)   LMP 04/01/1983   SpO2 95%   BMI 23.17 kg/m   BP Readings from Last 3 Encounters:  06/09/23 122/66  04/15/23 120/64  04/07/23 122/70    Wt Readings from Last 3 Encounters:  06/09/23 135 lb (61.2 kg)  04/15/23 132 lb (59.9 kg)  04/07/23 131 lb (59.4 kg)    Physical Exam Constitutional:      General: She is not in acute distress.    Appearance: She is well-developed.  HENT:     Head: Normocephalic.     Right Ear: External ear normal.     Left Ear: External ear normal.     Nose: Nose normal.  Eyes:     General:        Right eye: No discharge.        Left eye: No discharge.     Conjunctiva/sclera: Conjunctivae normal.     Pupils: Pupils are equal, round, and reactive to light.  Neck:     Thyroid: No thyromegaly.     Vascular: No JVD.  Trachea: No tracheal deviation.  Cardiovascular:     Rate and Rhythm: Normal rate and regular rhythm.     Heart sounds: Normal heart sounds.  Pulmonary:     Effort: No respiratory distress.     Breath sounds: No stridor. No wheezing.  Abdominal:     General: Bowel sounds are normal. There is no distension.     Palpations: Abdomen is soft. There is no mass.     Tenderness: There is no abdominal tenderness. There is no guarding or rebound.  Musculoskeletal:        General: No tenderness.     Cervical back: Normal range of motion and neck supple. No rigidity.  Lymphadenopathy:     Cervical: No cervical adenopathy.  Skin:    Findings: No erythema or rash.  Neurological:     Cranial Nerves: No cranial nerve deficit.     Motor: No abnormal muscle tone.     Coordination: Coordination normal.     Deep Tendon  Reflexes: Reflexes normal.  Psychiatric:        Behavior: Behavior normal.        Thought Content: Thought content normal.        Judgment: Judgment normal.     Lab Results  Component Value Date   WBC 6.6 04/07/2023   HGB 12.6 04/07/2023   HCT 36.8 04/07/2023   PLT 288.0 04/07/2023   GLUCOSE 100 (H) 04/07/2023   CHOL 206 (H) 10/09/2022   TRIG 59.0 10/09/2022   HDL 85.60 10/09/2022   LDLDIRECT 104.3 06/07/2008   LDLCALC 108 (H) 10/09/2022   ALT 21 04/07/2023   AST 20 04/07/2023   NA 136 04/07/2023   K 4.7 04/07/2023   CL 100 04/07/2023   CREATININE 1.02 04/07/2023   BUN 24 (H) 04/07/2023   CO2 29 04/07/2023   TSH 0.55 10/09/2022   HGBA1C 6.0 09/24/2021    No results found.  Assessment & Plan:   Problem List Items Addressed This Visit     Essential hypertension   Cont on Avapro      Nausea & vomiting   Resolved off Fosamax      Osteopenia - Primary   Fosamax intolerant Take Ds and K2 Options discussed, ie Prolia Miacalcin Rx      Well adult exam      Meds ordered this encounter  Medications   calcitonin, salmon, (MIACALCIN/FORTICAL) 200 UNIT/ACT nasal spray    Sig: Place 1 spray into alternate nostrils daily.    Dispense:  3.7 mL    Refill:  11       Follow-up: Return for Wellness Exam.  Sonda Primes, MD

## 2023-06-09 NOTE — Assessment & Plan Note (Signed)
 Resolved off Fosamax

## 2023-06-09 NOTE — Assessment & Plan Note (Addendum)
 Fosamax intolerant Take Ds and K2 Options discussed, ie Prolia Miacalcin Rx

## 2023-06-09 NOTE — Assessment & Plan Note (Signed)
 Cont on Avapro

## 2023-06-09 NOTE — Patient Instructions (Signed)
 Miacalcin Rx? Vit D3 and K2

## 2023-06-17 ENCOUNTER — Encounter: Payer: Self-pay | Admitting: Internal Medicine

## 2023-06-26 ENCOUNTER — Ambulatory Visit: Payer: Medicare Other

## 2023-06-26 VITALS — BP 120/60 | HR 66 | Ht 65.0 in | Wt 131.4 lb

## 2023-06-26 DIAGNOSIS — Z0000 Encounter for general adult medical examination without abnormal findings: Secondary | ICD-10-CM | POA: Diagnosis not present

## 2023-06-26 NOTE — Patient Instructions (Signed)
 Caroline Olson , Thank you for taking time to come for your Medicare Wellness Visit. I appreciate your ongoing commitment to your health goals. Please review the following plan we discussed and let me know if I can assist you in the future.   Referrals/Orders/Follow-Ups/Clinician Recommendations: It was nice to meet you today.  You will be due for a Tetanus vaccine in the month of April, which you can get at any pharmacy.  Keep up the good work.  This is a list of the screening recommended for you and due dates:  Health Maintenance  Topic Date Due   COVID-19 Vaccine (4 - 2024-25 season) 11/30/2022   Medicare Annual Wellness Visit  01/03/2023   DTaP/Tdap/Td vaccine (3 - Td or Tdap) 07/01/2023   Pneumonia Vaccine  Completed   Flu Shot  Completed   DEXA scan (bone density measurement)  Completed   Zoster (Shingles) Vaccine  Completed   HPV Vaccine  Aged Out    Advanced directives: (In Chart) A copy of your advanced directives are scanned into your chart should your provider ever need it.  Next Medicare Annual Wellness Visit scheduled for next year: Yes

## 2023-06-26 NOTE — Progress Notes (Signed)
 Subjective:   Caroline Olson is a 83 y.o. who presents for a Medicare Wellness preventive visit.  Visit Complete: In person  Persons Participating in Visit: Patient.  AWV Questionnaire: No: Patient Medicare AWV questionnaire was not completed prior to this visit.  Cardiac Risk Factors include: advanced age (>52men, >55 women);hypertension;Other (see comment), Risk factor comments: Coronary atherosclerosis,  Atherosclerosis of aorta     Objective:    Today's Vitals   06/26/23 1129  BP: 120/60  Pulse: 66  SpO2: 99%  Weight: 131 lb 6.4 oz (59.6 kg)  Height: 5\' 5"  (1.651 m)   Body mass index is 21.87 kg/m.     06/26/2023   11:40 AM 01/02/2022   11:49 AM 12/25/2020   11:28 AM 09/08/2019   10:21 AM 08/31/2018   10:16 AM 08/17/2017    9:37 AM 08/04/2016    9:35 AM  Advanced Directives  Does Patient Have a Medical Advance Directive? Yes Yes Yes Yes Yes Yes Yes  Type of Estate agent of [place];Living will Healthcare Power of [place];Living will  Living will;Healthcare Power of [employer] Power of [place];Living will Healthcare Power of [place];Living will Healthcare Power of [place];Living will  Does patient want to make changes to medical advance directive? No - Patient declined No - Patient declined No - Patient declined No - Patient declined   Yes (ED - Information included in AVS)  Copy of Healthcare Power of Attorney in Chart? Yes - validated most recent copy scanned in chart (See row information) Yes - validated most recent copy scanned in chart (See row information)   No - copy requested No - copy requested Yes    Current Medications (verified) Outpatient Encounter Medications as of 06/26/2023  Medication Sig   amLODipine (NORVASC) 5 MG tablet Take 1 tablet (5 mg total) by mouth daily.   Collagen Hydrolysate, Bovine, POWD by Does not apply route.   cyanocobalamin 1000 MCG tablet Take 0.5 tablets (500 mcg total) by mouth every other day.    irbesartan (AVAPRO) 150 MG tablet Take 1 tablet (150 mg total) by mouth daily.   Multiple Vitamins-Minerals (MULTIVITAMIN WITH MINERALS) tablet Take 1 tablet by mouth 3 (three) times a week.    Multiple Vitamins-Minerals (WOMENS MULTIVITAMIN + COLLAGEN PO) Take by mouth.   pantoprazole (PROTONIX) 40 MG tablet Take 1 tablet (40 mg total) by mouth daily.   Red Yeast Rice Extract 300 MG CAPS Take 1 capsule by mouth daily.   VITAMIN D PO Take 1,000 Int'l Units by mouth.   calcitonin, salmon, (MIACALCIN/FORTICAL) 200 UNIT/ACT nasal spray Place 1 spray into alternate nostrils daily. (Patient not taking: Reported on 06/26/2023)   cephALEXin (KEFLEX) 500 MG capsule Take 1 capsule (500 mg total) by mouth 3 (three) times daily. (Patient not taking: Reported on 06/26/2023)   cyclobenzaprine (FLEXERIL) 5 MG tablet Take 1 tablet (5 mg total) by mouth at bedtime as needed for muscle spasms. (Patient not taking: Reported on 06/26/2023)   saccharomyces boulardii (FLORASTOR) 250 MG capsule Take 1 capsule (250 mg total) by mouth 2 (two) times daily. (Patient not taking: Reported on 06/26/2023)   No facility-administered encounter medications on file as of 06/26/2023.    Allergies (verified) Aspirin, Codeine sulfate, and Fosamax [alendronate]   History: Past Medical History:  Diagnosis Date   Adenomatous colon polyp 2014   Allergy    Anemia    Arthritis    Breast cancer (HCC) 2011   DCIF   Hypertension  Lichen simplex chronicus 05/09/2022   on vulvar biopsy   Osteopenia    Personal history of radiation therapy    Varicose veins    Past Surgical History:  Procedure Laterality Date   ABDOMINAL HYSTERECTOMY  1985   APPENDECTOMY  1956   pt states appendix was on left side not rt. per surgeon   BICEPT TENODESIS Right    tendon repair   BREAST LUMPECTOMY Left 2011   left, reexcision 1/12   CATARACT EXTRACTION, BILATERAL     COLONOSCOPY     COLONOSCOPY W/ POLYPECTOMY  2014   ENDOVENOUS ABLATION  SAPHENOUS VEIN W/ LASER Left 01/27/2013   left greater saphenous vein and sclerotherapy left leg by Gretta Began MD   ENDOVENOUS ABLATION SAPHENOUS VEIN W/ LASER Right 02/10/2013   right greater saphenous vein and sclerotherapy right leg by Gretta Began MD   POLYPECTOMY     ROTATOR CUFF REPAIR Right 2009   stab phlebectomy Left 07/21/2013   10-20 incisions left leg by Gretta Began MD   Family History  Problem Relation Age of Onset   Pancreatic cancer Mother    Hypertension Mother    Other Mother        varicose veins   Heart disease Father    Diabetes Father    Hypertension Sister    Tremor Sister    Diabetes Maternal Grandfather    Breast cancer Maternal Aunt    Colon cancer Neg Hx    Colon polyps Neg Hx    Esophageal cancer Neg Hx    Rectal cancer Neg Hx    Stomach cancer Neg Hx    Social History   Socioeconomic History   Marital status: Married    Spouse name: Onalee Hua   Number of children: 2   Years of education: Not on file   Highest education level: Associate degree: occupational, Scientist, product/process development, or vocational program  Occupational History   Occupation: retired   Tobacco Use   Smoking status: Never   Smokeless tobacco: Never  Vaping Use   Vaping status: Never Used  Substance and Sexual Activity   Alcohol use: Yes    Alcohol/week: 5.0 standard drinks of alcohol    Types: 5 Glasses of wine per week    Comment: social-wine   Drug use: No   Sexual activity: Yes    Partners: Male    Birth control/protection: Surgical    Comment: TAH/BSO  Other Topics Concern   Not on file  Social History Narrative   Patient is married she is retired and has 2 daughters   Some alcohol use no tobacco or drug use      Lives at home with husband   Social Drivers of Corporate investment banker Strain: Low Risk  (06/26/2023)   Overall Financial Resource Strain (CARDIA)    Difficulty of Paying Living Expenses: Not very hard  Food Insecurity: No Food Insecurity (06/26/2023)   Hunger Vital  Sign    Worried About Running Out of Food in the Last Year: Never true    Ran Out of Food in the Last Year: Never true  Transportation Needs: No Transportation Needs (06/26/2023)   PRAPARE - Administrator, Civil Service (Medical): No    Lack of Transportation (Non-Medical): No  Physical Activity: Sufficiently Active (06/26/2023)   Exercise Vital Sign    Days of Exercise per Week: 5 days    Minutes of Exercise per Session: 60 min  Stress: No Stress Concern Present (06/26/2023)  [employer] of Occupational Health - Occupational Stress Questionnaire    Feeling of Stress : Not at all  Social Connections: Socially Integrated (06/26/2023)   Social Connection and Isolation Panel [NHANES]    Frequency of Communication with Friends and Family: More than three times a week    Frequency of Social Gatherings with Friends and Family: More than three times a week    Attends Religious Services: More than 4 times per year    Active Member of [employer] or Organizations: Yes    Attends Banker Meetings: Never    Marital Status: Married    Tobacco Counseling Counseling given: Not Answered    Clinical Intake:  Pre-visit preparation completed: Yes  Pain : No/denies pain     BMI - recorded: 21.87 Nutritional Status: BMI of 19-24  Normal Nutritional Risks: None  Lab Results  Component Value Date   HGBA1C 6.0 09/24/2021   HGBA1C 6.0 09/18/2020   HGBA1C 5.8 09/08/2019     How often do you need to have someone help you when you read instructions, pamphlets, or other written materials from your doctor or pharmacy?: 1 - Never  Interpreter Needed?: No  Information entered by :: Toran Murch, RMA   Activities of Daily Living     06/26/2023   11:25 AM  In your present state of health, do you have any difficulty performing the following activities:  Hearing? 0  Vision? 0  Difficulty concentrating or making decisions? 0  Walking or climbing stairs? 0  Dressing  or bathing? 0  Doing errands, shopping? 0  Preparing Food and eating ? N  Using the Toilet? N  In the past six months, have you accidently leaked urine? N  Do you have problems with loss of bowel control? N  Managing your Medications? N  Managing your Finances? N  Housekeeping or managing your Housekeeping? N    Patient Care Team: Plotnikov, Georgina Quint, MD as PCP - General Abigail Miyamoto, MD (General Surgery) Jerene Bears, MD as Attending Physician (Gynecology) Zenovia Jordan, MD as Consulting Physician (Rheumatology) Daisy Blossom, MD (Inactive) as Consulting Physician (Hematology and Oncology) Jerene Bears, MD as Consulting Physician (Gynecology) Shon Millet, MD as Consulting Physician (Ophthalmology) Frederico Hamman, MD as Consulting Physician (Orthopedic Surgery)  Indicate any recent Medical Services you may have received from other than [facility] providers in the past year (date may be approximate).     Assessment:   This is a routine wellness examination for [facility].  Hearing/Vision screen Hearing Screening - Comments:: Denies hearing difficulties   Vision Screening - Comments:: Wears eyeglasses   Goals Addressed             This Visit's Progress    My goal is to keep moving on by being physically active and independent.   On track    Joined a gym-2025       Depression Screen     06/26/2023   11:42 AM 04/07/2023   10:58 AM 10/09/2022    9:18 AM 06/26/2022    9:19 AM 04/28/2022    2:42 PM 01/02/2022   11:46 AM 10/10/2021   10:27 AM  PHQ 2/9 Scores  PHQ - 2 Score 3 0 0 0 0 0 0  PHQ- 9 Score 4          Fall Risk     06/26/2023   11:40 AM 04/07/2023   10:58 AM 10/09/2022    9:18 AM 01/02/2022   11:50 AM  12/25/2020   11:29 AM  Fall Risk   Falls in the past year? 0 0 0 0 0  Number falls in past yr: 0 0 0 0 0  Injury with Fall? 0 0 0 0 0  Risk for fall due to : No Fall Risks No Fall Risks No Fall Risks No Fall Risks No Fall Risks  Follow up Falls  prevention discussed;Falls evaluation completed Falls evaluation completed Falls evaluation completed Falls prevention discussed Falls evaluation completed    MEDICARE RISK AT HOME:  Medicare Risk at Home Any stairs in or around the home?: No Home free of loose throw rugs in walkways, pet beds, electrical cords, etc?: Yes Adequate lighting in your home to reduce risk of falls?: Yes Life alert?: No Use of a cane, walker or w/c?: No Grab bars in the bathroom?: No Shower chair or bench in shower?: No Elevated toilet seat or a handicapped toilet?: No  TIMED UP AND GO:  Was the test performed?  Yes  Length of time to ambulate 10 feet: 20 sec Gait slow and steady without use of assistive device  Cognitive Function: Normal: Normal cognitive status assessed by direct observation by this Clinical Health Advisor. No abnormalities found. Patient is able to answer questions in an accurate and timely manner.    08/17/2017    9:39 AM  MMSE - Mini Mental State Exam  Orientation to time 5  Orientation to Place 5  Registration 3  Attention/ Calculation 5  Recall 3  Language- name 2 objects 2  Language- repeat 1  Language- follow 3 step command 3  Language- read & follow direction 1  Write a sentence 1  Copy design 1  Total score 30        01/02/2022   11:19 AM 09/08/2019   10:24 AM  6CIT Screen  What Year? 0 points 0 points  What month? 0 points 0 points  What time? 0 points 0 points  Count back from 20 0 points 0 points  Months in reverse 0 points 0 points  Repeat phrase 0 points 0 points  Total Score 0 points 0 points    Immunizations Immunization History  Administered Date(s) Administered   Fluad Quad(high Dose 65+) 01/20/2019, 12/15/2019, 12/25/2020, 01/02/2022   Fluad Trivalent(High Dose 65+) 03/10/2023   Influenza Split 02/13/2015   Influenza,inj,Quad PF,6+ Mos 02/09/2016, 01/30/2017, 01/30/2018   Influenza-Unspecified 01/18/2013, 01/12/2014, 01/30/2016   PFIZER(Purple  Top)SARS-COV-2 Vaccination 05/08/2019, 06/07/2019, 01/03/2020   Pneumococcal Conjugate-13 01/25/2014   Pneumococcal Polysaccharide-23 07/08/2009   Td 05/30/2003   Tdap 06/30/2013   Zoster Recombinant(Shingrix) 11/11/2016, 01/01/2017   Zoster, Live 06/07/2008    Screening Tests Health Maintenance  Topic Date Due   COVID-19 Vaccine (4 - 2024-25 season) 11/30/2022   DTaP/Tdap/Td (3 - Td or Tdap) 07/01/2023   Medicare Annual Wellness (AWV)  06/25/2024   Pneumonia Vaccine 62+ Years old  Completed   INFLUENZA VACCINE  Completed   DEXA SCAN  Completed   Zoster Vaccines- Shingrix  Completed   HPV VACCINES  Aged Out    Health Maintenance  Health Maintenance Due  Topic Date Due   COVID-19 Vaccine (4 - 2024-25 season) 11/30/2022   Health Maintenance Items Addressed: See Nurse Notes  Additional Screening:  Vision Screening: Recommended annual ophthalmology exams for early detection of glaucoma and other disorders of the eye.  Dental Screening: Recommended annual dental exams for proper oral hygiene  Community Resource Referral / Chronic Care Management: CRR required this visit?  No  CCM required this visit?  No     Plan:     I have personally reviewed and noted the following in the patient's chart:   Medical and social history Use of alcohol, tobacco or illicit drugs  Current medications and supplements including opioid prescriptions. Patient is not currently taking opioid prescriptions. Functional ability and status Nutritional status Physical activity Advanced directives List of other physicians Hospitalizations, surgeries, and ER visits in previous 12 months Vitals Screenings to include cognitive, depression, and falls Referrals and appointments  In addition, I have reviewed and discussed with patient certain preventive protocols, quality metrics, and best practice recommendations. A written personalized care plan for preventive services as well as general  preventive health recommendations were provided to patient.     Tiler Brandis L Jordanne Elsbury, CMA   06/26/2023   After Visit Summary: (In Person-Printed) AVS printed and given to the patient  Notes: Nothing significant to report at this time.

## 2023-10-06 ENCOUNTER — Other Ambulatory Visit: Payer: Self-pay | Admitting: Internal Medicine

## 2023-10-20 ENCOUNTER — Encounter: Payer: Self-pay | Admitting: Internal Medicine

## 2023-10-20 ENCOUNTER — Ambulatory Visit: Payer: Self-pay | Admitting: Internal Medicine

## 2023-10-20 ENCOUNTER — Other Ambulatory Visit: Payer: Self-pay | Admitting: Internal Medicine

## 2023-10-20 ENCOUNTER — Ambulatory Visit (INDEPENDENT_AMBULATORY_CARE_PROVIDER_SITE_OTHER): Payer: Medicare Other | Admitting: Internal Medicine

## 2023-10-20 VITALS — BP 137/70 | HR 65 | Temp 97.8°F | Ht 65.0 in | Wt 131.0 lb

## 2023-10-20 DIAGNOSIS — I10 Essential (primary) hypertension: Secondary | ICD-10-CM

## 2023-10-20 DIAGNOSIS — Z0000 Encounter for general adult medical examination without abnormal findings: Secondary | ICD-10-CM | POA: Diagnosis not present

## 2023-10-20 DIAGNOSIS — I2583 Coronary atherosclerosis due to lipid rich plaque: Secondary | ICD-10-CM

## 2023-10-20 DIAGNOSIS — D519 Vitamin B12 deficiency anemia, unspecified: Secondary | ICD-10-CM | POA: Diagnosis not present

## 2023-10-20 DIAGNOSIS — R768 Other specified abnormal immunological findings in serum: Secondary | ICD-10-CM | POA: Insufficient documentation

## 2023-10-20 DIAGNOSIS — M25561 Pain in right knee: Secondary | ICD-10-CM | POA: Insufficient documentation

## 2023-10-20 DIAGNOSIS — N183 Chronic kidney disease, stage 3 unspecified: Secondary | ICD-10-CM

## 2023-10-20 DIAGNOSIS — R7309 Other abnormal glucose: Secondary | ICD-10-CM

## 2023-10-20 DIAGNOSIS — I872 Venous insufficiency (chronic) (peripheral): Secondary | ICD-10-CM

## 2023-10-20 DIAGNOSIS — M25519 Pain in unspecified shoulder: Secondary | ICD-10-CM | POA: Insufficient documentation

## 2023-10-20 DIAGNOSIS — M76899 Other specified enthesopathies of unspecified lower limb, excluding foot: Secondary | ICD-10-CM | POA: Insufficient documentation

## 2023-10-20 DIAGNOSIS — M79609 Pain in unspecified limb: Secondary | ICD-10-CM | POA: Insufficient documentation

## 2023-10-20 DIAGNOSIS — G5700 Lesion of sciatic nerve, unspecified lower limb: Secondary | ICD-10-CM | POA: Insufficient documentation

## 2023-10-20 LAB — CBC WITH DIFFERENTIAL/PLATELET
Basophils Absolute: 0 K/uL (ref 0.0–0.1)
Basophils Relative: 0.9 % (ref 0.0–3.0)
Eosinophils Absolute: 0.1 K/uL (ref 0.0–0.7)
Eosinophils Relative: 2.7 % (ref 0.0–5.0)
HCT: 33 % — ABNORMAL LOW (ref 36.0–46.0)
Hemoglobin: 11.3 g/dL — ABNORMAL LOW (ref 12.0–15.0)
Lymphocytes Relative: 22 % (ref 12.0–46.0)
Lymphs Abs: 0.9 K/uL (ref 0.7–4.0)
MCHC: 34.2 g/dL (ref 30.0–36.0)
MCV: 91.9 fl (ref 78.0–100.0)
Monocytes Absolute: 0.3 K/uL (ref 0.1–1.0)
Monocytes Relative: 8.2 % (ref 3.0–12.0)
Neutro Abs: 2.7 K/uL (ref 1.4–7.7)
Neutrophils Relative %: 66.2 % (ref 43.0–77.0)
Platelets: 219 K/uL (ref 150.0–400.0)
RBC: 3.59 Mil/uL — ABNORMAL LOW (ref 3.87–5.11)
RDW: 12.5 % (ref 11.5–15.5)
WBC: 4.1 K/uL (ref 4.0–10.5)

## 2023-10-20 LAB — URINALYSIS, ROUTINE W REFLEX MICROSCOPIC
Bilirubin Urine: NEGATIVE
Hgb urine dipstick: NEGATIVE
Ketones, ur: NEGATIVE
Nitrite: NEGATIVE
RBC / HPF: NONE SEEN (ref 0–?)
Specific Gravity, Urine: 1.01 (ref 1.000–1.030)
Total Protein, Urine: NEGATIVE
Urine Glucose: NEGATIVE
Urobilinogen, UA: 0.2 (ref 0.0–1.0)
pH: 7 (ref 5.0–8.0)

## 2023-10-20 LAB — TSH: TSH: 0.79 u[IU]/mL (ref 0.35–5.50)

## 2023-10-20 LAB — COMPREHENSIVE METABOLIC PANEL WITH GFR
ALT: 11 U/L (ref 0–35)
AST: 15 U/L (ref 0–37)
Albumin: 4.6 g/dL (ref 3.5–5.2)
Alkaline Phosphatase: 47 U/L (ref 39–117)
BUN: 25 mg/dL — ABNORMAL HIGH (ref 6–23)
CO2: 30 meq/L (ref 19–32)
Calcium: 10.1 mg/dL (ref 8.4–10.5)
Chloride: 103 meq/L (ref 96–112)
Creatinine, Ser: 1.39 mg/dL — ABNORMAL HIGH (ref 0.40–1.20)
GFR: 34.76 mL/min — ABNORMAL LOW (ref >60.00)
Glucose, Bld: 103 mg/dL — ABNORMAL HIGH (ref 70–99)
Potassium: 4.7 meq/L (ref 3.5–5.1)
Sodium: 139 meq/L (ref 135–145)
Total Bilirubin: 0.6 mg/dL (ref 0.2–1.2)
Total Protein: 7.3 g/dL (ref 6.0–8.3)

## 2023-10-20 LAB — LIPID PANEL
Cholesterol: 218 mg/dL — ABNORMAL HIGH (ref 0–200)
HDL: 86.6 mg/dL (ref >39.00)
LDL Cholesterol: 117 mg/dL — ABNORMAL HIGH (ref 0–99)
NonHDL: 131.06
Total CHOL/HDL Ratio: 3
Triglycerides: 68 mg/dL (ref 0.0–149.0)
VLDL: 13.6 mg/dL (ref 0.0–40.0)

## 2023-10-20 MED ORDER — AMLODIPINE BESYLATE 5 MG PO TABS: 5.0000 mg | ORAL_TABLET | Freq: Every day | ORAL | 3 refills | Status: AC

## 2023-10-20 MED ORDER — IRBESARTAN 150 MG PO TABS: 150.0000 mg | ORAL_TABLET | Freq: Every day | ORAL | 3 refills | Status: DC

## 2023-10-20 NOTE — Progress Notes (Addendum)
 Subjective:  Patient ID: Caroline Olson, female    DOB: Jan 14, 1941  Age: 83 y.o. MRN: [id number]  CC: Annual Exam and Medical Management of Chronic Issues (Discuss Vericose veins and concerns about skin and questions of Vitamins pt can take a/w dosage)   HPI Caroline Olson presents for a well exam  Outpatient Medications Prior to Visit  Medication Sig Dispense Refill   Collagen Hydrolysate, Bovine, POWD by Does not apply route.     cyanocobalamin  1000 MCG tablet Take 0.5 tablets (500 mcg total) by mouth every other day. 100 tablet 1   Multiple Vitamins-Minerals (MULTIVITAMIN WITH MINERALS) tablet Take 1 tablet by mouth 3 (three) times a week.      Multiple Vitamins-Minerals (WOMENS MULTIVITAMIN + COLLAGEN PO) Take by mouth.     pantoprazole  (PROTONIX ) 40 MG tablet Take 1 tablet (40 mg total) by mouth daily. 30 tablet 3   Red Yeast Rice Extract 300 MG CAPS Take 1 capsule by mouth daily. 100 capsule 3   VITAMIN D  PO Take 1,000 Int'l Units by mouth.     amLODipine  (NORVASC ) 5 MG tablet TAKE 1 TABLET BY MOUTH DAILY 90 tablet 3   irbesartan  (AVAPRO ) 150 MG tablet Take 1 tablet (150 mg total) by mouth daily. 90 tablet 3   calcitonin, salmon, (MIACALCIN/FORTICAL) 200 UNIT/ACT nasal spray Place 1 spray into alternate nostrils daily. (Patient not taking: Reported on 10/20/2023) 3.7 mL 11   cephALEXin  (KEFLEX ) 500 MG capsule Take 1 capsule (500 mg total) by mouth 3 (three) times daily. (Patient not taking: Reported on 10/20/2023) 21 capsule 0   cyclobenzaprine  (FLEXERIL ) 5 MG tablet Take 1 tablet (5 mg total) by mouth at bedtime as needed for muscle spasms. (Patient not taking: Reported on 10/20/2023) 30 tablet 1   saccharomyces boulardii (FLORASTOR) 250 MG capsule Take 1 capsule (250 mg total) by mouth 2 (two) times daily. (Patient not taking: Reported on 10/20/2023) 60 capsule 1   No facility-administered medications prior to visit.    ROS: Review of Systems  Constitutional:  Negative for activity  change, appetite change, chills, fatigue and unexpected weight change.  HENT:  Negative for congestion, mouth sores and sinus pressure.   Eyes:  Negative for visual disturbance.  Respiratory:  Negative for cough and chest tightness.   Gastrointestinal:  Negative for abdominal pain and nausea.  Genitourinary:  Negative for difficulty urinating, frequency and vaginal pain.  Musculoskeletal:  Negative for back pain and gait problem.  Skin:  Negative for pallor and rash.  Neurological:  Negative for dizziness, tremors, weakness, numbness and headaches.  Psychiatric/Behavioral:  Negative for confusion, sleep disturbance and suicidal ideas.     Objective:  BP 137/70   Pulse 65   Temp 97.8 F (36.6 C) (Oral)   Ht 5' 5 (1.651 m)   Wt 131 lb (59.4 kg)   LMP 04/01/1983   SpO2 98%   BMI 21.80 kg/m   BP Readings from Last 3 Encounters:  10/20/23 137/70  06/26/23 120/60  06/09/23 122/66    Wt Readings from Last 3 Encounters:  10/20/23 131 lb (59.4 kg)  06/26/23 131 lb 6.4 oz (59.6 kg)  06/09/23 135 lb (61.2 kg)    Physical Exam Constitutional:      General: She is not in acute distress.    Appearance: Normal appearance. She is well-developed.  HENT:     Head: Normocephalic.     Right Ear: External ear normal.     Left Ear: External ear normal.  Nose: Nose normal.  Eyes:     General:        Right eye: No discharge.        Left eye: No discharge.     Conjunctiva/sclera: Conjunctivae normal.     Pupils: Pupils are equal, round, and reactive to light.  Neck:     Thyroid : No thyromegaly.     Vascular: No JVD.     Trachea: No tracheal deviation.  Cardiovascular:     Rate and Rhythm: Normal rate and regular rhythm.     Heart sounds: Normal heart sounds.  Pulmonary:     Effort: No respiratory distress.     Breath sounds: No stridor. No wheezing.  Abdominal:     General: Bowel sounds are normal. There is no distension.     Palpations: Abdomen is soft. There is no mass.      Tenderness: There is no abdominal tenderness. There is no guarding or rebound.  Musculoskeletal:        General: No tenderness.     Cervical back: Normal range of motion and neck supple. No rigidity.  Lymphadenopathy:     Cervical: No cervical adenopathy.  Skin:    Findings: No erythema or rash.  Neurological:     Cranial Nerves: No cranial nerve deficit.     Motor: No abnormal muscle tone.     Coordination: Coordination normal.     Deep Tendon Reflexes: Reflexes normal.  Psychiatric:        Behavior: Behavior normal.        Thought Content: Thought content normal.        Judgment: Judgment normal.     Lab Results  Component Value Date   WBC 4.1 10/20/2023   HGB 11.3 (L) 10/20/2023   HCT 33.0 (L) 10/20/2023   PLT 219.0 10/20/2023   GLUCOSE 103 (H) 10/20/2023   CHOL 218 (H) 10/20/2023   TRIG 68.0 10/20/2023   HDL 86.60 10/20/2023   LDLDIRECT 104.3 06/07/2008   LDLCALC 117 (H) 10/20/2023   ALT 11 10/20/2023   AST 15 10/20/2023   NA 139 10/20/2023   K 4.7 10/20/2023   CL 103 10/20/2023   CREATININE 1.39 (H) 10/20/2023   BUN 25 (H) 10/20/2023   CO2 30 10/20/2023   TSH 0.79 10/20/2023   HGBA1C 6.0 09/24/2021    MM 3D SCREENING MAMMOGRAM BILATERAL BREAST Result Date: 06/12/2023 CLINICAL DATA:  Screening. EXAM: DIGITAL SCREENING BILATERAL MAMMOGRAM WITH TOMOSYNTHESIS AND CAD TECHNIQUE: Bilateral screening digital craniocaudal and mediolateral oblique mammograms were obtained. Bilateral screening digital breast tomosynthesis was performed. The images were evaluated with computer-aided detection. COMPARISON:  Previous exam(s). ACR Breast Density Category c: The breasts are heterogeneously dense, which may obscure small masses. FINDINGS: There are no findings suspicious for malignancy. IMPRESSION: No mammographic evidence of malignancy. A result letter of this screening mammogram will be mailed directly to the patient. RECOMMENDATION: Screening mammogram in one year.  (Code:SM-B-01Y) BI-RADS CATEGORY  1: Negative. Electronically Signed   By: Rosaline Collet M.D.   On: 06/12/2023 06:48    Assessment & Plan:   Problem List Items Addressed This Visit     Anemia due to vitamin B12 deficiency   Check Labs      Chronic renal insufficiency, stage 3 (moderate) (HCC)   Worse.  GFR is 34.7.  Hydrate better especially in the heat.  Reduce Avapro  to 75 mg a day      Chronic venous insufficiency   Doing well  Relevant Medications   amLODipine  (NORVASC ) 5 MG tablet   irbesartan  (AVAPRO ) 150 MG tablet   Other Relevant Orders   TSH (Completed)   Urinalysis   CBC with Differential/Platelet (Completed)   Lipid panel (Completed)   Comprehensive metabolic panel with GFR (Completed)   Coronary atherosclerosis   CT calcium score is 16.7 ASA Red rice yeast extract Pt declined statins      Relevant Medications   amLODipine  (NORVASC ) 5 MG tablet   irbesartan  (AVAPRO ) 150 MG tablet   Elevated glucose   Monitor gucose      Essential hypertension   Cont on Avapro       Relevant Medications   amLODipine  (NORVASC ) 5 MG tablet   irbesartan  (AVAPRO ) 150 MG tablet   Other Relevant Orders   TSH (Completed)   Urinalysis   CBC with Differential/Platelet (Completed)   Lipid panel (Completed)   Comprehensive metabolic panel with GFR (Completed)   Well adult exam - Primary   We discussed age appropriate health related issues, including available/recomended screening tests and vaccinations. Labs were ordered to be later reviewed . All questions were answered. We discussed one or more of the following - seat belt use, use of sunscreen/sun exposure exercise, fall risk reduction, second hand smoke exposure, seat belt use, a need for adhering to healthy diet and exercise. Mammo Eye exam q 12 mo BDS - Dr Cleotilde: Vit D3 and K2 PAP - Dr Cleotilde Last colon 2019 Labs were ordered.  All questions were answered. Hiking, playing Pickleball       Relevant Orders    TSH (Completed)   Urinalysis   CBC with Differential/Platelet (Completed)   Lipid panel (Completed)   Comprehensive metabolic panel with GFR (Completed)      Meds ordered this encounter  Medications   amLODipine  (NORVASC ) 5 MG tablet    Sig: Take 1 tablet (5 mg total) by mouth daily.    Dispense:  90 tablet    Refill:  3   irbesartan  (AVAPRO ) 150 MG tablet    Sig: Take 1 tablet (150 mg total) by mouth daily.    Dispense:  90 tablet    Refill:  3      Follow-up: Return in about 3 months (around 01/20/2024) for a follow-up visit.  Marolyn Noel, MD

## 2023-10-20 NOTE — Assessment & Plan Note (Signed)
 Monitor gucose

## 2023-10-20 NOTE — Assessment & Plan Note (Signed)
 Worse.  GFR is 34.7.  Hydrate better especially in the heat.  Reduce Avapro  to 75 mg a day

## 2023-10-20 NOTE — Addendum Note (Signed)
 Addended by: Maridee Slape V on: 10/20/2023 09:28 PM   Modules accepted: Level of Service

## 2023-10-20 NOTE — Assessment & Plan Note (Signed)
 Cont on Avapro

## 2023-10-20 NOTE — Assessment & Plan Note (Signed)
Check Labs. 

## 2023-10-20 NOTE — Assessment & Plan Note (Signed)
 CT calcium score is 16.7 ASA Red rice yeast extract Pt declined statins

## 2023-10-20 NOTE — Assessment & Plan Note (Signed)
 Doing well

## 2023-10-20 NOTE — Assessment & Plan Note (Signed)
 We discussed age appropriate health related issues, including available/recomended screening tests and vaccinations. Labs were ordered to be later reviewed . All questions were answered. We discussed one or more of the following - seat belt use, use of sunscreen/sun exposure exercise, fall risk reduction, second hand smoke exposure, seat belt use, a need for adhering to healthy diet and exercise. Mammo Eye exam q 12 mo BDS - Dr Cleotilde: Vit D3 and K2 PAP - Dr Cleotilde Last colon 2019 Labs were ordered.  All questions were answered. Hiking, playing Pickleball

## 2023-10-20 NOTE — Assessment & Plan Note (Signed)
 Worse.  Hydrate better especially in the heat.  Reduce Avapro  to 75 mg a day

## 2023-12-30 ENCOUNTER — Ambulatory Visit: Admitting: Podiatry

## 2023-12-30 ENCOUNTER — Encounter: Payer: Self-pay | Admitting: Podiatry

## 2023-12-30 DIAGNOSIS — L600 Ingrowing nail: Secondary | ICD-10-CM

## 2023-12-30 NOTE — Progress Notes (Signed)
 Subjective:   Patient ID: Caroline Olson, female   DOB: 83 y.o.   MRN: [id number]   HPI Patient states she developed a split in her right hallux nail which has been very sore but is improved recently with soaks and has a chronic spicule of her left hallux medial border that can be painful   ROS      Objective:  Physical Exam  Right hallux has a mid split but it is localized and not causing problems with a slight incurvation medial border but not currently tender with a spicule of the left medial side that is tender and grows abnormally with the rest of the nail being beautifully taken care of     Assessment:  Split in the right hallux with trauma but does not appear to be pathological with ingrown deformity of the left hallux medial border painful     Plan:  H&P reviewed both conditions do not recommend treatment of the right foot for the left I have recommended removal of the nail spicule and permanent correction.  She wants this done she reads then signed consent form I infiltrated the left big toe 60 mg like Marcaine mixture sterile prep done using sterile instrumentation removed the lateral border exposed matrix applied phenol 3 applications 30 seconds followed by alcohol of sterile dressing gave instructions on soaks wear dressing 24 hours take it off earlier if throbbing were to occur all questions answered

## 2023-12-30 NOTE — Patient Instructions (Signed)

## 2024-01-21 ENCOUNTER — Other Ambulatory Visit (INDEPENDENT_AMBULATORY_CARE_PROVIDER_SITE_OTHER)

## 2024-01-21 DIAGNOSIS — I10 Essential (primary) hypertension: Secondary | ICD-10-CM

## 2024-01-21 LAB — COMPREHENSIVE METABOLIC PANEL WITH GFR
ALT: 14 U/L (ref 0–35)
AST: 17 U/L (ref 0–37)
Albumin: 4.3 g/dL (ref 3.5–5.2)
Alkaline Phosphatase: 42 U/L (ref 39–117)
BUN: 30 mg/dL — ABNORMAL HIGH (ref 6–23)
CO2: 29 meq/L (ref 19–32)
Calcium: 9.9 mg/dL (ref 8.4–10.5)
Chloride: 102 meq/L (ref 96–112)
Creatinine, Ser: 1.35 mg/dL — ABNORMAL HIGH (ref 0.40–1.20)
GFR: 35.93 mL/min — ABNORMAL LOW (ref >60.00)
Glucose, Bld: 101 mg/dL — ABNORMAL HIGH (ref 70–99)
Potassium: 4.7 meq/L (ref 3.5–5.1)
Sodium: 138 meq/L (ref 135–145)
Total Bilirubin: 0.6 mg/dL (ref 0.2–1.2)
Total Protein: 6.8 g/dL (ref 6.0–8.3)

## 2024-01-24 ENCOUNTER — Ambulatory Visit: Payer: Self-pay | Admitting: Internal Medicine

## 2024-03-14 ENCOUNTER — Other Ambulatory Visit: Payer: Self-pay | Admitting: Internal Medicine

## 2024-03-14 NOTE — Telephone Encounter (Unsigned)
 Copied from CRM [id number]. Topic: Clinical - Medication Refill >> Mar 14, 2024 11:34 AM Jasmin G wrote: Medication: irbesartan  (AVAPRO ). Pt requested for a 75 mg tablet instead, as her prescription was changed in October to half a tablet of 150 mg.  Has the patient contacted their pharmacy? Yes (Agent: If no, request that the patient contact the pharmacy for the refill. If patient does not wish to contact the pharmacy document the reason why and proceed with request.) (Agent: If yes, when and what did the pharmacy advise?)  This is the patient's preferred pharmacy:  [facility] - [place], [place] - [address] [place] [address] Phone: [phone] Fax: [phone]  Is this the correct pharmacy for this prescription? Yes If no, delete pharmacy and type the correct one.   Has the prescription been filled recently? Yes  Is the patient out of the medication? No  Has the patient been seen for an appointment in the last year OR does the patient have an upcoming appointment? Yes  Can we respond through MyChart? No  Agent: Please be advised that Rx refills may take up to 3 business days. We ask that you follow-up with your pharmacy.

## 2024-03-15 MED ORDER — IRBESARTAN 150 MG PO TABS: 150.0000 mg | ORAL_TABLET | Freq: Every day | ORAL | 3 refills | Status: AC

## 2024-03-18 ENCOUNTER — Encounter: Payer: Self-pay | Admitting: Pharmacist

## 2024-03-18 NOTE — Progress Notes (Signed)
 Pharmacy Quality Measure Review  This patient is appearing on a report for being at risk of failing the adherence measure for hypertension (ACEi/ARB) medications this calendar year.   Medication: Irbesartan  Last fill date: 03/15/24 for 90 day supply  Insurance report was not up to date. No action needed at this time.   Darrelyn Drum, PharmD, BCPS, CPP Clinical Pharmacist Practitioner [facility] [phone]

## 2024-04-04 NOTE — Progress Notes (Signed)
 "  Breast and Pelvic Exam Patient name: Caroline Olson MRN [id number]  Date of birth: 1940/04/10 Chief Complaint:   Gynecologic Exam  History of Present Illness:   Caroline Olson is a 84 y.o. G2P2 Caucasian female being seen today for breast and pelvic exam.  Having some mild incontinence especially when playing pickle ball or when hiking.  Would like referral to pelvic PT.  Has done this in the past.  Santina to [facility] at that time and saw Louana Salt who is now retired.  Denies vaginal bleeding.  Denies dysuria.  Has occasional vulvar itching.  Then she will use aquaphor and the itching will resolve.    Had to stop Fosamax .  Last bone density was 05/2022.     Patient's last menstrual period was 04/01/1983.  Last pap: Hysterectomy. H/O abnormal pap: no Last mammogram: 06/08/2023. Results were: normal. Family h/o breast cancer: yes maternal aunt Last colonoscopy: 03/04/2018. Results were: abnormal three polyps. Family h/o colorectal cancer: no     04/05/2024   10:31 AM 10/20/2023    9:30 AM 06/26/2023   11:42 AM 04/07/2023   10:58 AM 10/09/2022    9:18 AM  Depression screen PHQ 2/9  Decreased Interest 0 0 0 0 0  Down, Depressed, Hopeless 0 0 3 0 0  PHQ - 2 Score 0 0 3 0 0  Altered sleeping   0    Tired, decreased energy   1    Change in appetite   0    Feeling bad or failure about yourself    0    Trouble concentrating   0    Moving slowly or fidgety/restless   0    Suicidal thoughts   0    PHQ-9 Score   4     Difficult doing work/chores   Not difficult at all       Data saved with a previous flowsheet row definition     Review of Systems:   Pertinent items are noted in HPI Denies any headaches, blurred vision, fatigue, shortness of breath, chest pain, abdominal pain, abnormal vaginal discharge/itching/odor/irritation, problems with periods, bowel movements, urination, or intercourse unless otherwise stated above. Pertinent History Reviewed:  Reviewed past medical,surgical,  social and family history.  Reviewed problem list, medications and allergies. Physical Assessment:   Vitals:   04/05/24 1030  BP: 122/61  Pulse: 69  SpO2: 100%  Weight: 132 lb 6.4 oz (60.1 kg)  Height: 5' 5 (1.651 m)  Body mass index is 22.03 kg/m.        Physical Examination:   General appearance - well appearing, and in no distress  Mental status - alert, oriented to person, place, and time  Psych:  She has a normal mood and affect  Skin - warm and dry, normal color, no suspicious lesions noted  Chest - effort normal, all lung fields clear to auscultation bilaterally  Heart - normal rate and regular rhythm  Neck:  midline trachea, no thyromegaly or nodules  Breasts - right breast without masses, nipple discharge, no LAD; left breast with well healed scar and stable radiation changes, no masses, no nipple discharge  Abdomen - soft, nontender, nondistended, no masses or organomegaly  Pelvic - VULVA: normal appearing vulva with no masses, tenderness or lesions   VAGINA: normal appearing vagina with normal color and discharge, no lesions   CERVIX:surgically absent  Thin prep pap is not indicated  UTERUS: surgically absent  ADNEXA: No adnexal masses or tenderness  noted.  Rectal - normal rectal, good sphincter tone, no masses felt.  Extremities:  No swelling or varicosities noted  Chaperone present for exam  No results found for this or any previous visit (from the past 24 hours).  Assessment & Plan:  1. Encntr for gyn exam (general) (routine) w/o abn findings (Primary) - Pap smear not indicated - Mammogram order placed to be done in March - Colonoscopy 2019.  No routine screening recommended - Bone mineral density order placed to do after 06/02/2024 - lab work done with PCP, Dr. Garald - vaccines reviewed/updated  2. Stress incontinence - Ambulatory referral to Physical Therapy  3. Osteopenia of neck of right femur - DG Bone Density; Future  4. Vulvar itching -  using aquaphor intermittently with good improvement in symptoms  5. History of breast cancer - stable exam  6. Encounter for screening mammogram for malignant neoplasm of breast - MM 3D SCREENING MAMMOGRAM BILATERAL BREAST; Future    Orders Placed This Encounter  Procedures   DG Bone Density   Ambulatory referral to Physical Therapy    Meds: No orders of the defined types were placed in this encounter.   Follow-up: No follow-ups on file.  Ronal GORMAN Pinal, MD 04/05/2024 11:59 AM "

## 2024-04-05 ENCOUNTER — Encounter (HOSPITAL_BASED_OUTPATIENT_CLINIC_OR_DEPARTMENT_OTHER): Payer: Self-pay | Admitting: Obstetrics & Gynecology

## 2024-04-05 ENCOUNTER — Ambulatory Visit (INDEPENDENT_AMBULATORY_CARE_PROVIDER_SITE_OTHER): Admitting: Obstetrics & Gynecology

## 2024-04-05 VITALS — BP 122/61 | HR 69 | Ht 65.0 in | Wt 132.4 lb

## 2024-04-05 DIAGNOSIS — Z01419 Encounter for gynecological examination (general) (routine) without abnormal findings: Secondary | ICD-10-CM | POA: Diagnosis not present

## 2024-04-05 DIAGNOSIS — M85851 Other specified disorders of bone density and structure, right thigh: Secondary | ICD-10-CM

## 2024-04-05 DIAGNOSIS — N393 Stress incontinence (female) (male): Secondary | ICD-10-CM

## 2024-04-05 DIAGNOSIS — Z853 Personal history of malignant neoplasm of breast: Secondary | ICD-10-CM

## 2024-04-05 DIAGNOSIS — Z1231 Encounter for screening mammogram for malignant neoplasm of breast: Secondary | ICD-10-CM

## 2024-04-05 DIAGNOSIS — Z1331 Encounter for screening for depression: Secondary | ICD-10-CM

## 2024-04-05 DIAGNOSIS — L292 Pruritus vulvae: Secondary | ICD-10-CM | POA: Diagnosis not present

## 2024-04-26 ENCOUNTER — Ambulatory Visit: Admitting: Internal Medicine

## 2024-05-24 ENCOUNTER — Ambulatory Visit: Admitting: Internal Medicine

## 2024-05-27 ENCOUNTER — Ambulatory Visit: Admitting: Physical Therapy

## 2024-07-04 ENCOUNTER — Ambulatory Visit

## 2024-11-08 ENCOUNTER — Encounter: Admitting: Internal Medicine
